# Patient Record
Sex: Female | Born: 1975 | Race: White | Hispanic: Yes | Marital: Married | Smoking: Former smoker
Health system: Southern US, Community
[De-identification: ages and names within clinical notes are randomized; demographics above are authoritative.]

## PROBLEM LIST (undated history)

## (undated) ENCOUNTER — Inpatient Hospital Stay (HOSPITAL_COMMUNITY): Payer: Self-pay

## (undated) DIAGNOSIS — O021 Missed abortion: Secondary | ICD-10-CM

## (undated) DIAGNOSIS — R079 Chest pain, unspecified: Secondary | ICD-10-CM

## (undated) DIAGNOSIS — E119 Type 2 diabetes mellitus without complications: Secondary | ICD-10-CM

## (undated) HISTORY — PX: OTHER SURGICAL HISTORY: SHX169

## (undated) HISTORY — DX: Type 2 diabetes mellitus without complications: E11.9

## (undated) HISTORY — DX: Chest pain, unspecified: R07.9

## (undated) HISTORY — DX: Missed abortion: O02.1

---

## 2008-12-24 ENCOUNTER — Inpatient Hospital Stay (HOSPITAL_COMMUNITY): Admission: AD | Admit: 2008-12-24 | Discharge: 2008-12-24 | Payer: Self-pay | Admitting: Family Medicine

## 2009-07-08 ENCOUNTER — Ambulatory Visit: Payer: Self-pay | Admitting: Gynecology

## 2009-07-13 ENCOUNTER — Ambulatory Visit: Payer: Self-pay | Admitting: Gynecology

## 2009-07-14 ENCOUNTER — Ambulatory Visit: Payer: Self-pay | Admitting: Gynecology

## 2009-08-06 ENCOUNTER — Ambulatory Visit: Payer: Self-pay | Admitting: Gynecology

## 2010-02-06 ENCOUNTER — Ambulatory Visit: Payer: Self-pay | Admitting: Obstetrics and Gynecology

## 2010-02-08 ENCOUNTER — Ambulatory Visit: Payer: Self-pay | Admitting: Gynecology

## 2010-03-22 ENCOUNTER — Ambulatory Visit: Payer: Self-pay | Admitting: Gynecology

## 2010-03-28 ENCOUNTER — Ambulatory Visit: Payer: Self-pay | Admitting: Gynecology

## 2010-04-19 ENCOUNTER — Ambulatory Visit: Payer: Self-pay | Admitting: Gynecology

## 2010-09-28 ENCOUNTER — Encounter: Admission: RE | Admit: 2010-09-28 | Discharge: 2010-09-28 | Payer: Self-pay | Admitting: Specialist

## 2011-03-13 LAB — URINALYSIS, ROUTINE W REFLEX MICROSCOPIC
Bilirubin Urine: NEGATIVE
Glucose, UA: NEGATIVE mg/dL
Ketones, ur: NEGATIVE mg/dL
Specific Gravity, Urine: 1.02 (ref 1.005–1.030)
pH: 7 (ref 5.0–8.0)

## 2011-03-13 LAB — CBC
HCT: 40.7 % (ref 36.0–46.0)
MCHC: 34.3 g/dL (ref 30.0–36.0)
MCV: 88.2 fL (ref 78.0–100.0)
Platelets: 256 10*3/uL (ref 150–400)
WBC: 8.3 10*3/uL (ref 4.0–10.5)

## 2011-03-13 LAB — WET PREP, GENITAL: Yeast Wet Prep HPF POC: NONE SEEN

## 2011-03-13 LAB — URINE MICROSCOPIC-ADD ON

## 2011-03-13 LAB — POCT PREGNANCY, URINE: Preg Test, Ur: NEGATIVE

## 2011-03-13 LAB — URINE CULTURE

## 2011-03-13 LAB — GC/CHLAMYDIA PROBE AMP, GENITAL: GC Probe Amp, Genital: NEGATIVE

## 2012-06-03 ENCOUNTER — Other Ambulatory Visit: Payer: Self-pay | Admitting: *Deleted

## 2012-06-03 ENCOUNTER — Ambulatory Visit (INDEPENDENT_AMBULATORY_CARE_PROVIDER_SITE_OTHER): Payer: Self-pay | Admitting: Gynecology

## 2012-06-03 ENCOUNTER — Encounter: Payer: Self-pay | Admitting: Gynecology

## 2012-06-03 VITALS — BP 122/80

## 2012-06-03 DIAGNOSIS — N979 Female infertility, unspecified: Secondary | ICD-10-CM

## 2012-06-03 DIAGNOSIS — N915 Oligomenorrhea, unspecified: Secondary | ICD-10-CM

## 2012-06-03 DIAGNOSIS — E119 Type 2 diabetes mellitus without complications: Secondary | ICD-10-CM | POA: Insufficient documentation

## 2012-06-03 DIAGNOSIS — E663 Overweight: Secondary | ICD-10-CM

## 2012-06-03 MED ORDER — DOXYCYCLINE HYCLATE 50 MG PO CAPS: 100.0000 mg | ORAL_CAPSULE | Freq: Two times a day (BID) | ORAL | Status: AC

## 2012-06-03 NOTE — Patient Instructions (Addendum)
Infertilidad (Infertility) QU ES LA INFERTILIDAD?  La infertilidad normalmente se define como la incapacidad para quedar embarazada luego de un ao de relaciones sexuales regulares sin la utilizacin de mtodos anticonceptivos. O la incapacidad de llevar a trmino Chartered loss adjuster y Warehouse manager el beb. La tasa de infertilidad en los [place] es de alrededor del 10%. El [address] es el resultado de una cadena de sucesos. La mujer debe liberar el vulo de uno de sus ovarios (ovulacin). El vulo debe fertilizarse con el esperma. Luego viaja a travs de las trompas de Falopio hacia el tero (matriz), donde se une a la pared del tero y crece. El hombre debe tener suficiente esperma y el esperma debe unirse con el vulo (fertilizar) en el momento justo. El vulo fertilizado debe luego unirse al interior del tero. Esto parece simple, pero pueden ocurrir [employer] cosas que evitan que el embarazo se produzca.  DE QUIN ES EL PROBLEMA?  Un 20% de los casos de infertilidad se deben a problemas del hombres (factores masculinos) y un 65% se debe a problemas de la mujer (factores femeninos). Otras causas pueden ser Neomia Dear combinacin de factores masculinos y femeninos o a causas desconocidas.  CULES SON LAS CAUSAS DE LA INFERTILIDAD EN EL HOMBRE?  La infertilidad en el hombre a menudo se debe a problemas para producir el esperma o hacer que el mismo llegue al vulo. Los problemas de esperma pueden existir desde el nacimiento o desarrollarse ms tarde debido a enfermedades o lesiones. Algunos hombres no producen esperma, o producen muy poco (oligospermia). Entre otros problemas se incluyen:  Disfuncin sexual   Problemas hormonales o endocrinos.   La edad. La fertilidad del hombre disminuye con la edad, pero no tan temprano como la femenina.   Infecciones.   Problemas congnitos Defectos de nacimiento, como ausencia de los tubos que transportan el esperma (conductos deferentes).   Problemas genticos o de  cromosomas.   Problemas de anticuerpos antiesperma.   Eyaculacin retrgrada (el esperma va hacia la vejiga).   Varicoceles, espermatoceles o tumores en los testculos.   El estilo de vida puede influir en el nmero y la calidad del esperma del hombre.   El alcohol y las drogas pueden reducir temporalmente la calidad del esperma.   Las toxinas [address], como los pesticidas y el plomo, pueden ocasionar algunos casos de infertilidad en hombres.  CULES SON LAS CAUSAS DE LA INFERTILIDAD EN LA MUJER?   La infertilidad en las mujeres est causada mayormente por problemas con la ovulacin. Sin ovulacin, el vulo no puede fertilizarse.   Seales de problemas de ovulacin son perodos menstruales irregulares o ningn perodo.   Factores simples del estilo de vida, como el estrs, la dieta, o el entrenamiento deportivo, pueden afectar el balance hormonal de Medical laboratory scientific officer.   La edad. La fertilidad comienza a IT consultant alrededor de los 30 aos y [place] a Glass blower/designer de los 37.   Mucho menos a menudo, un desequilibrio hormonal por un problema mdico serio como un tumor en la glndula pituitaria, tiroides u otra enfermedad mdica crnica pueden ocasionar problemas de ovulacin.   Infecciones plvicas.   Sndrome de ovarios poliqusticos (aumento de hormonas masculinas, incapacidad de ovular).   Consumo de alcohol o drogas.   Toxinas ambientales, radiacin, pesticidas y ciertos qumicos.   La edad es un factor importante en la infertilidad femenina.   La capacidad de los ovarios de una mujer para producir vulos disminuye con la edad, especialmente despus de los 35 aos.  Alrededor de un tercio de las parejas en las que la mujer tiene ms de 35 aos tendr problemas de infertilidad.   En el momento en el que alcanza la [place], cuando su perodo menstrual se detiene, la mujer ya no puede producir vulos ni quedar embarazada.   Otros problemas tambin pueden llevar a la  infertilidad en las mujeres. Si las trompas de Nordstrom estn bloqueadas en [employer] extremos, el vulo no puede pasar a travs de los tubos [place]. Tejido cicatrizal (adhesiones) en la pelvis que pueden obstruir las trompas. Esto puede dar como resultado una enfermedad inflamatoria plvica, endometriosis o una ciruga por embarazo ectpico (en la que el vulo fertilizado se ha implantado fuera del tero) o cualquier ciruga plvica o abdominal que ocasione adherencias.   Tumores fibroides o plipos en el tero.   Anormalidades congnitas (de nacimiento) del tero.   Infecciones en el cuello del tero (cervicitis).   Estenosis cervical (estrechamiento).   Mucosidad cervical anormal.   Sndrome poliqustico de los ovarios.   Tener relaciones sexuales muy seguidas (todos [place] o 4 a 5 veces por semana).   Obesidad.   Anorexia.   Dficit nutricional.   Mucho ejercicio, con prdida de grasa corporal.   DES. Su madre ha recibido la hormona dietilstilbesterol cuando estaba embarazada de usted.  CMO SE ANALIZA LA INFERTILIDAD?  Si ha estado tratando de quedar embarazada sin xito, podra querer buscar ayuda mdica. No debera esperar un ao de intentos sin xito antes de buscar ayuda profesional si:  Tiene mas de 35 aos de edad   Tiene razones para creer que podra tener problemas de fertilidad.  Un examen mdico determinar las causas de infertilidad de la pareja, Normalmente el proceso comienza con:  Exmenes fsicos   Historias clnicas de ambos miembros de la pareja.   Historiales sexuales de ambos miembros de la pareja.  Si no hay un problema evidente, como relaciones sexuales en tiempos inadecuados o ausencia de ovulacin, se necesitarn Academic librarian.   Para el hombre, los anlisis normalmente comienzan con pruebas de semen para observar:   La cantidad de esperma.   La forma del esperma.   El movimiento del esperma.   Realizar un historial clnico  y quirrgico completo.   Examen fsico.   Control de infecciones en los rganos reproductivos.  Podrn realizarle anlisis hormonales.   Para la mujer, el primer paso del anlisis es saber si ovula cada mes. Hay diferentes modos de Designer, industrial/product. Por ejemplo, puede llevar un registro de los cambios en la temperatura corporal por la maana y la textura de la mucosidad cervical. Otra herramienta es un kit de prueba de ovulacin casero, que puede comprarse en la farmacia.   Tambin pueden realizarse controles de ovulacin en el consultorio mdico, mediante anlisis de sangre para observar los niveles de hormonas o pruebas de [employer] ovarios. Si la mujer est ovulando, se necesitarn realizar ms exmenes. Algunas femeninas comunes incluyen:   Histerosalpingografa: Se realizan rayos x de las trompas de [place]opio y el tero con una inyeccin de [place]. Mostrar si las trompas estn abiertas y la forma del tero.   Laparoscopa: Un anlisis de las trompas y otros rganos femeninos para Copywriter, advertising. Se utiliza un tubo con iluminacin llamado laparoscopio para observar el interior del abdomen.   Biopsia endometrial: Se toma una muestra del tejido del tero Film/video editor del perodo menstrual, para ver si el tejido le indica si est ovulando.  Prueba de ultrasonido transvaginal: Examina los rganos femeninos.   Histeroscopa: Utiliza un tubo con iluminacin para examinar el cuello del tero y el tero y ver si hay anormalidades dentro del mismo.  TRATAMIENTO Dependiendo de los [employer], se sugerirn IT sales professional. El tratamiento depende de la causa. Entre el 85 y el 90% de los casos de infertilidad se tratan con drogas o [place].   Hay varias drogas para la fertilidad que pueden utilizarse para las mujeres con problemas de ovulacin. Es importante hablar con el profesional que la asiste sobre la droga a Chemical engineer. Deber comprender los beneficios y  efectos colaterales de las drogas. Segn el tipo de droga para la fertilidad y la dosis [place], algunas mujeres podran tener embarazos mltiples (mellizos).   De ser [employer], se puede realizar una ciruga para reparar daos en los ovarios de la [place], trompas de [place], el cuello del tero o el tero.   Tratamiento quirrgico o mdico para la endometriosis o el sndrome de ovario poliqustico. A veces, los problemas de fertilidad en el hombre pueden corregirse con medicamentos o [place].   Inseminacin intrauterina, IUI, de esperma en concordancia con la ovulacin.   Cambios en el estilo de vida, si esta es la causa (prdida de [place], aumento de ejercicios, dejar de fumar, beber excesivamente o tomar drogas ilegales).   Otros tipos de ciruga:   Extirpar tumores por dentro o sobre el tero.   Eliminar tejido cicatrizal de dentro del tero.   Arreglar trompas obstruidas.   Eliminar tejido cicatrizal en la pelvis y alrededor de los rganos femeninos.  QU ES LA TECNOLOGA DE REPRODUCCIN ASISTIDA (ART)?  La tecnologa de reproduccin asistida (ART) utiliza mtodos especiales para ayudar a parejas infrtiles. En esta tcnica se manipula tanto el vulo de la mujer como el esperma del hombre. El xito depende de muchos factores. La ART puede ser cara y [address]. Pero ha hecho posible que muchas parejas tengan hijos que de otra manera no hubieran podido. A continuacin se enumeran algunos mtodos:  Fertilizacin. In Vitro (FIV). In Vitro (IVF) es un procedimiento que se hizo famoso con el nacimiento en 1978 de [place], el primer "beb de probeta" del mundo. Se utiliza cuando las trompas de Nordstrom de la mujer estn bloqueadas o cuando el hombre tiene poco esperma. Se utiliza una droga para estimular los ovarios y producir mltiples vulos. Una vez maduros, los vulos se retiran y se Industrial/product designer en una placa de cultivo con el esperma del hombre para la fertilizacin. Luego de  aproximadamente 40 horas, los vulos se examinan para ver si han sido fertilizados por el esperma y se han dividido en clulas. Esos vulos fertilizados (embriones) se colocan luego en el tero de la mujer. Esto evita el paso por las trompas de [place].   La transferencia intrafalopiana de gametas (GIFT) es similar al IVF, pero se utiliza cuando la mujer tiene al menos una trompa de falopio normal. Se colocan de tres a cinco vulos en la trompa de Falopio, junto con el esperma del hombre, para que la fertilizacin se realice dentro del cuerpo de Architectural technologist.   La transferencia intrafalopiana de cigotos (ZIFT), tambin se denomina transferencia embrionaria, y [place] el IVF con el GIFT. Los vulos retirados de los ovarios de la mujer se Land laboratorio y se Industrial/product designer en las trompas de Nordstrom en lugar de en el tero.   Los procedimientos de ART a menudo suponen la utilizacin de donantes de  vulos (de otra mujer) o de embriones congelados previamente. Los donantes de vulos pueden utilizarse si la mujer tiene [employer] ovarios o posee una enfermedad gentica que podra transmitirla al beb.   Cuando se realiza una ART hay mayor riesgo de embarazos mltiples, mellizos, trillizos o ms.   La inyeccin de esperma intracitoplasmtica es un procedimiento que inyecta un espermatozoide en el vulo para fertilizarlo.   El trasplante embrionario es un procedimiento que comienza con un embrin que se ha desarrollado en un medio especial (solucin qumica) preparado para mantener el embrin vivo por 2 a 5 das, y Conservation officer, historic buildings.  En los [employer] que no puede encontrarse la causa y Firefighter no se produce, podr considerarse la adopcin. Document Released: 12/03/2007 Document Revised: 11/02/2011  Ejercicios para perder peso (Exercise to Lose Weight) La actividad fsica y Neomia Dear dieta saludable ayudan a perder peso. El mdico podr sugerirle ejercicios especficos. IDEAS Y  CONSEJOS PARA HACER EJERCICIOS  Elija opciones econmicas que disfrute hacer , como caminar, andar en bicicleta o los vdeos para ejercitarse.   Utilice las Microbiologist del ascensor.   Camine durante la hora del almuerzo.   Estacione el auto lejos del lugar de [place] o [place].   Concurra a un gimnasio o tome clases de gimnasia.   Comience con 5  10 minutos de actividad fsica por da. Ejercite hasta 30 minutos, 4 a 6 das por [address]   Utilice zapatos que tengan un buen soporte y ropas cmodas.   Elongue antes y despus de Company secretary.   Ejercite hasta que aumente la respiracin y el corazn palpite rpido.   Beba agua extra cuando ejercite.   No haga ejercicio Firefighter, sentirse mareado o que le falte mucho el aire.  La actividad fsica puede quemar alrededor de 150 caloras.  Correr 20 cuadras en 15 minutos.   Jugar vley durante 45 a 60 minutos.   Limpiar y encerar el auto durante 45 a 60 minutos.   Jugar ftbol americano de toque.   Caminar 25 cuadras en 35 minutos.   Empujar un cochecito 20 cuadras en 30 minutos.   Jugar baloncesto durante 30 minutos.   Rastrillar hojas secas durante 30 minutos.   Andar en bicicleta 80 cuadras en 30 minutos.   Caminar 30 cuadras en 30 minutos.   Bailar durante 30 minutos.   Quitar la nieve con una pala durante 15 minutos.   Nadar vigorosamente durante 20 minutos.   Subir escaleras durante 15 minutos.   Andar en bicicleta 60 cuadras durante 15 minutos.   Arreglar el jardn entre 30 y 45 minutos.   Saltar a la soga durante 15 minutos.   Limpiar vidrios o pisos durante 45 a 60 minutos.  Document Released: 02/17/2011 Document Revised: 07/26/2011 [facility] Patient Information 2012 [place], [place].                                                   Control del colesterol  Los niveles de colesterol en el organismo estn determinados significativamente por su dieta. Los niveles de colesterol tambin se  relacionan con la enfermedad cardaca. El material que sigue ayuda a Software engineer relacin y a Chiropractor qu puede hacer para mantener su corazn sano. No todo el colesterol es [place]. Las lipoprotenas de baja densidad (LDL) forman el  colesterol "malo". El colesterol malo puede ocasionar depsitos de grasa que se acumulan en el interior de las arterias. Las lipoprotenas de alta densidad (HDL) es el colesterol "bueno". Ayuda a remover el colesterol LDL "malo" de la [place]. El colesterol es un factor de riesgo muy importante para la enfermedad cardaca. Otros factores de riesgo son la hipertensin arterial, el hbito de fumar, el estrs, la herencia y [place].  El msculo cardaco obtiene el suministro de sangre a travs de las arterias coronarias. Si su colesterol LDL ("malo") est elevado y el HDL ("bueno") es bajo, tiene un factor de riesgo para que se formen depsitos de [place] representative en las arterias coronarias (los vasos sanguneos que suministran sangre al corazn). Esto hace que haya menos lugar para que la sangre circule. Sin la suficiente sangre y oxgeno, el msculo cardaco no puede funcionar correctamente, y usted podr sentir dolores en el pecho (angina pectoris). Cuando una arteria coronaria se cierra completamente, una parte del msculo cardaco puede morir (infarto de miocardio). CONTROL DEL COLESTEROL Cuando el profesional que lo asiste enva la sangre al laboratorio para Artist nivel de colesterol, puede realizarle tambin un perfil completo de los lpidos. Con esta prueba, se puede determinar la cantidad total de colesterol, as como los niveles de LDL y HDL. Los triglicridos son un tipo de grasa que circula en la sangre y que tambin puede utilizarse para determinar el riesgo de enfermedad cardaca. En la siguiente tabla se establecen los nmeros ideales: Prueba: Colesterol total  Menos de 200 mg/dl.  Prueba: LDL "colesterol malo"  Menos de 100 mg/dl.   Menos de 70 mg/dl si tiene riesgo muy  elevado de sufrir un ataque cardaco o muerte cardaca sbita.  Prueba: HDL "colesterol bueno"  Mujeres: Ms de 50 mg/dl.   Hombres: Ms de 40 mg/dl.  Prueba: Trigliceridos  Menos de 150 mg/dl.  CONTROL DEL COLESTEROL CON DIETA Aunque factores como el ejercicio y el estilo de vida son importantes, la "primera lnea de ataque" es la dieta. Esto se debe a que se sabe que ciertos alimentos hacen subir el colesterol y otros lo [place]. El objetivo debe ser [employer] alimentos, de modo que tengan un efecto sobre el colesterol y, an ms importante, Microbiologist las grasas saturadas y trans con otros tipos de grasas, como las monoinsaturadas y las poliinsaturadas y cidos grasos omega-3 . En promedio, una persona no debe consumir ms de 15 a 17 g de grasas saturadas por [employer]. Las grasas saturadas y trans se consideran grasas "malas", ya que elevan el colesterol LDL. Las grasas saturadas se encuentran principalmente en productos animales como carne, [place] y crema. Pero esto no significa que usted Marketing executive todas sus comidas favoritas. Actualmente, como lo muestra el cuadro que figura al final de este documento, hay sustitutos de buen sabor, bajos en grasas y en colesterol, para la mayora de los alimentos que a usted Musician. Elija aquellos alimentos alternativos que sean bajos en grasas o sin grasas. Elija cortes de carne del cuarto trasero o lomo ya que estos cortes son los que tienen menor cantidad de grasa y Oncologist. El pollo (sin piel), el pescado, la carne de ternera, y la [place] de [place] molida son excelentes opciones. Elimine las carnes [employer] o el salami. Los [employer] o nada de grasas saturadas. Cuando consuma carne [place], carne de aves de corral, o pescado, hgalo en porciones de 85 gramos (3 onzas). Las grasas trans tambin se llaman "aceites  parcialmente hidrogenados". Son aceites manipulados cientficamente de [place] que son slidos a Educational psychologist, tienen una larga vida y Glass blower/designer sabor y la textura de los alimentos a los que se Scientist, clinical (histocompatibility and immunogenetics). Las grasas trans se encuentran en la [place], [place], crackers y alimentos horneados.  Para hornear y cocinar, el aceite es un excelente sustituto para la [employer]. Los aceites monoinsaturados tienen un beneficio particular, ya que se cree que disminuyen el colesterol LDL (colesterol malo) y elevan el HDL. Deber evitar los aceites tropicales saturados como el de coco y el de [place].  Recuerde, adems, que puede comer sin restricciones los grupos de alimentos que son naturalmente libres de grasas saturadas y Neurosurgeon trans, entre los que se incluyen el pescado, las frutas (excepto el [place]), verduras, frijoles, cereales (cebada, arroz, [place], trigo) y las pastas (sin salsas con crema)  IDENTIFIQUE LOS ALIMENTOS QUE DISMINUYEN EL COLESTEROL  Pueden disminuir el colesterol las fibras solubles que estn en las frutas, como las [place], en los vegetales como el brcoli, las patatas y las zanahorias; en las legumbres como frijoles, guisantes y Therapist, occupational; y en los cereales como la cebada. Los alimentos fortificados con fitosteroles tambin Engineer, production. Debe consumir al menos 2 g de estos alimentos a diario para Financial planner de disminucin de [place].  En el supermercado, lea las etiquetas de los envases para identificar los alimentos bajos en grasas saturadas, libres de grasas trans y bajos en [place], . Elija quesos que tengan solo de 2 a 3 g de grasa saturada por onza (28,35 g). Use una margarina que no dae el corazn, [place] de grasas trans o aceite parcialmente hidrogenado. Al comprar alimentos horneados (galletitas dulces y Gaffer) evite el aceite parcialmente hidrogenado. Los panes y bollos debern ser de granos enteros (harina de maz o de avena entera, en lugar de "harina" o "harina enriquecida"). Compre sopas en lata que no sean cremosas, con bajo contenido de sal y sin  grasas adicionadas.  TCNICAS DE PREPARACIN DE LOS ALIMENTOS  Nunca fra los alimentos en aceite abundante. Si debe frer, hgalo en poco aceite y removiendo [place], porque as se utilizan muy pocas grasas, o utilice un spray antiadherente. Cuando le sea posible, hierva, hornee o ase las carnes y cocine los vegetales al vapor. En vez de A[employer]na con mantequilla o [place], utilice limn y hierbas, pur de Psychologist, educational y canela (para las calabazas y batatas), yogurt y salsa descremados y aderezos para ensaladas bajos en contenido graso.  BAJO EN GRASAS SATURADAS / SUSTITUTOS BAJOS EN GRASA  [place]arnes / Grasas saturadas (g)  Evite: Bife, corte graso (3 oz/85 g) / 11 g   Elija: Bife, corte magro (3 oz/85 g) / 4 g   Evite: Hamburguesa (3 oz/85 g) / 7 g   Elija:  [place] magra (3 oz/85 g) / 5 g   Evite: Jamn (3 oz/85 g) / 6 g   Elija:  Jamn magro (3 oz/85 g) / 2.4 g   Evite: Pollo, con piel (3 oz/85 g), Carne oscura / 4 g   Elija:  Pollo, sin piel (3 oz/85 g), Carne oscura / 2 g   Evite: Pollo, con piel (3 oz/85 g), Carne magra / 2.5 g   Elija: Pollo, sin piel (3 oz/85 g), Carne magra / 1 g  Lcteos / Grasas saturadas (g)  Evite: Leche entera (1 taza) / 5 g   Elija: Leche con bajo contenido de grasa, 2% (1 taza) / 3 g   Elija:  Leche con bajo contenido de grasa, 1% (1 taza) / 1.5 g   Elija: Leche descremada (1 taza) / 0.3 g   Evite: Queso duro (1 oz/28 g) / 6 g   Elija: Queso descremado (1 oz/28 g) / 2-3 g   Evite: Queso cottage, 4% grasa (1 taza)/ 6.5 g   Elija: Queso cottage con bajo contenido de grasa, 1% grasa (1 taza)/ 1.5 g   Evite: Helado (1 taza) / 9 g   Elija: Sorbete (1 taza) / 2.5 g   Elija: Yogurt helado sin contenido de grasa (1 taza) / 0.3 g   Elija: Barras de fruta congeladas / vestigios   Evite: Crema batida (1 cucharada) / 3.5 g   Elija: Batidos glac sin lcteos (1 cucharada) / 1 g  Condimentos / Grasas saturadas (g)  Evite:  Mayonesa (1 cucharada) / 2 g   Elija: Mayonesa con bajo contenido de grasa (1 cucharada) / 1 g   Evite: [place] (1 cucharada) / 7 g   Elija: Margarina extra light (1 cucharada) / 1 g   Evite: Aceite de coco (1 cucharada) / 11.8 g   Elija: Aceite de oliva (1 cucharada) / 1.8 g   Elija: Aceite de maz (1 cucharada) / 1.7 g   Elija: Aceite de crtamo (1 cucharada) / 1.2 g   Elija: Aceite de girasol (1 cucharada) / 1.4 g   Elija: Aceite de soja (1 cucharada) / 2.4 g   Elija: Aceite de canola (1 cucharada) / 1 g  Document Released: 11/13/2005 Document Revised: 07/26/2011 [facility] Patient Information 2012 [place], [place].   ExitCare Patient Information 2012 [place], [place].

## 2012-06-03 NOTE — Progress Notes (Signed)
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 Patient had been placed on clomiphene citrate for 2 cycles and stated she had done a home ovulation predictor test which were positive although 2 serum progesterone levels that were done in the office were below normal range although it appears it was a timing issue in patient not following instructions correctly. At that time patient had a normal TSH and prolactin. She now has  a new partner and has not been able to conceive. Her primary physician recently diagnosed her with type 2 diabetes and she's currently on metformin 500 mg twice a day as a result of her hemoglobin A1c having been found to be elevated at 6.9. She continues to suffer from oligomenorrhea whereby she will go 3 or 4 months without a menstrual cycle. She is overweight weight 156 pounds. She denies any visual disturbances or any unusual headaches or galactorrhea. She's currently on the third day of her menstrual cycle.  We will through a lengthy discussion once again of that causes and treatment of infertility. She brought a semen analysis report from her current partner that was done in 2010 whereby no agglutination was present normal viscosity was present normal volume and sperm count was 183 million. Normal forms 15% abnormal heads 68%.  In 2011 another provider had ordered an ultrasound and the reported demonstrated small cystic spaces in heterogeneity of the endometrium questionable polyps or hyperplasia and was to return back for followup endometrial biopsy which she did in February 2012 and the pathology report done by another provider had demonstrated the specimen consisted of minute strips of endometrial epithelium that appeared in active in may be from the lower uterine segment. No endometrium with intact glands and stroma were  present. Patient had a normal Pap smear in June of this year.  We will go ahead and proceeded scheduling a hysterosalpingogram at the end of the week to determine tubal patency. She will be prescribed Vibramycin to take 1 by mouth twice a day for 3 days starting with the day prior to the procedure for prophylaxis. We'll also check her prolactin level which she's not have a recent one done. She'll return back next week to discuss the results and plan a course of management and possibly repeat her partner semen analysis. All the above were discussed with the patient Spanish and literature information was provided as well. All questions were answered and we'll follow accordingly.

## 2012-06-04 ENCOUNTER — Telehealth: Payer: Self-pay | Admitting: *Deleted

## 2012-06-04 ENCOUNTER — Other Ambulatory Visit: Payer: Self-pay

## 2012-06-04 LAB — PROLACTIN: Prolactin: 7.2 ng/mL

## 2012-06-04 NOTE — Telephone Encounter (Signed)
Patient informed by [facility] CMA that appt set up for [facility]G for 06/07/12 @ 1pm at [facility].  Will need to pay $625 at check in.  Patient agreed.

## 2012-06-04 NOTE — Telephone Encounter (Signed)
Message copied by Libby Maw on Tue Jun 04, 2012  9:51 AM ------      Message from: Ok Edwards      Created: Mon Jun 03, 2012  3:34 PM       Please schedule HSG for this patient this week. Her last menstrual period 07/06. I need to see her next week.

## 2012-06-04 NOTE — Telephone Encounter (Signed)
CLARIFIED TO MELANIE AT [facility]. TO Take VIBRAMYCIN 2 Capsules OF 100 MG BID X 3 DAYS.

## 2012-06-07 ENCOUNTER — Ambulatory Visit (HOSPITAL_COMMUNITY)
Admission: RE | Admit: 2012-06-07 | Discharge: 2012-06-07 | Disposition: A | Discharge status: Home / Self Care | Payer: Self-pay | Source: Ambulatory Visit | Attending: Gynecology | Admitting: Gynecology

## 2012-06-07 DIAGNOSIS — N979 Female infertility, unspecified: Secondary | ICD-10-CM | POA: Insufficient documentation

## 2012-06-07 MED ORDER — IOHEXOL 300 MG/ML  SOLN
30.0000 mL | Freq: Once | INTRAMUSCULAR | Status: AC | PRN
  Administered 2012-06-07: 30 mL

## 2012-06-25 ENCOUNTER — Encounter: Payer: Self-pay | Admitting: Gynecology

## 2012-06-25 ENCOUNTER — Institutional Professional Consult (permissible substitution): Payer: Self-pay | Admitting: Gynecology

## 2012-06-25 ENCOUNTER — Ambulatory Visit (INDEPENDENT_AMBULATORY_CARE_PROVIDER_SITE_OTHER): Payer: Self-pay | Admitting: Gynecology

## 2012-06-25 VITALS — BP 120/82

## 2012-06-25 DIAGNOSIS — N915 Oligomenorrhea, unspecified: Secondary | ICD-10-CM

## 2012-06-25 DIAGNOSIS — N979 Female infertility, unspecified: Secondary | ICD-10-CM | POA: Insufficient documentation

## 2012-06-25 MED ORDER — CLOMIPHENE CITRATE 50 MG PO TABS: ORAL_TABLET | ORAL | Status: DC

## 2012-06-25 MED ORDER — MEDROXYPROGESTERONE ACETATE 10 MG PO TABS: ORAL_TABLET | ORAL | Status: DC

## 2012-06-25 NOTE — Patient Instructions (Addendum)
Ecografa transvaginal (Transvaginal Ultrasound) La ecografa transvaginal es una ecografa plvica en la que se utiliza una probeta metlica que se coloca en la vagina, para observar los rganos femeninos. El ecgrafo enva ondas sonoras desde un transductor (sonda). Estas ondas sonoras chocan contra las estructuras del cuerpo (como un eco) y crean Naval architect. La imagen se observa en un monitor. Se denomina transvaginal debido a que la sonda se inserta dentro de la vagina. Puede haber una pequea molestia por la introduccin de la sonda. Esta prueba tambin puede realizarse [employer]. La ecografa endovaginal es otro nombre para la ecografa transvaginal. En una ecografa transabdominal, la sonda se coloca en la parte externa del abdomen. Este mtodo no ofrece imgenes tan buenas como la tcnica transvaginal. La ecogafa transvaginal se utiliza para observar alteraciones en el tracto genital femenino. Entre ellos se incluyen:  Problemas de infertilidad.   Malformaciones congnitas (defecto de nacimiento) del tero y los ovarios.   Tumores en el tero.   Hemorragias anormales.   Tumores y quistes de ovario.   Abscesos (tejidos inflamados y pus) en la pelvis.   Dolor abdominal o plvico sin causa aparente.   Infecciones plvicas.  DURANTE EL EMBARAZO, SE UTILIZA PAR OBSERVAR:  Embarazos normales.   Un embarazo ectpico (embarazo fuera del tero).   Latidos cardacos fetales.   Anormalidades de la pelvis que no se observan bien con la ecografa transabdominal.   Sospecha de gemelos o embarazo mltiple.   Aborto inminente.   Problemas en el cuello del tero (cuello incompetente, no permanece cerrado para contener al beb).   Cuando se realiza una amniocentesis (se retira lquido de la bolsa [place], para ser [place]).   Al buscar anormalidades en el beb.   Para controlar el crecimiento, el desarrollo y la edad del feto.   Para medir la cantidad de lquido en el  saco amnitico.   Cuando se realiza una versin externa del beb (se lo mueve a Loss adjuster, chartered).   Evaluar al beb en embarazos de alto riesgo (perfil biofsico).   Si se sospecha el deceso del beb (muerte).  En algunos casos, se utiliza un mtodo especial denominado ecografa con infusin salina, para una observacin ms precisa del tero. Se inyecta solucin salina (agua con sal) dentro del tero en pacientes no embarazadas para observar mejor su interior. Este mtodo no se Glass blower/designer. La probeta tambin puede usarse para obtener biopsias de Insurance claims handler, para drenar lquido de quistes de ovario y para Editor, commissioning un DIU (dispositivo intrauterino para el control de la natalidad) que no pueda [place]. PREPARACIN PARA LA PRUEBA La ecografa transvaginal se realiza con la vejiga vaca. La ecografa transabdominal se realiza con la vejiga llena. Podrn solicitarle que beba varios vasos de agua antes del examen. En algunos casos se realiza una ecografa transabdominal antes de la ecografa transvaginal para obervar los rganos del abdomen. PROCEDIMIENTO  Deber acostarse en una cama, con las rodillas dobladas y los pies en los estribos. La probeta se cubre con un condn. Dentro de la vagina y en la probeta se aplica un lubricante estril. El lubricante ayuda a transmitir las ondas sonoras y Nature conservation officer la irritacin de la vagina. El mdico mover la sonda en el interior de la cavidad vaginal para escanear las estructuras plvicas. Un examen normal mostrar una pelvis normal y contenidos normales en su interior. Una prueba anormal mostrar anormalidades en la pelvis, la placenta o el beb. LAS CAUSAS DE UN RESULTADO ANORMAL PUEDEN SER:  Crecimientos o tumores en:   El tero.   Los ovarios.   La vagina.   Otras estructuras plvicas.   Crecimientos no cancerosos en el tero y los ovarios.   El ovario se retuerce y se corta el suministro de [place] (torsin [place]).   Las  reas de infeccin incluyen:   Enfermedad inflamatoria plvica.   Un absceso en la pelvis.   Ubicacin de un DIU.  LOS PROBLEMAS QUE PUEDEN HALLARSE EN UNA MUJER EMBARAZADA SON:  Embarazo ectpico (embarazo fuera del tero).   Embarazos mltiples.   Dilatacin (apertura) precoz anormal del cuello del tero. Esto puede indicar un cuello incompetente y Surveyor, mining.   Aborto inminente.   Muerte fetal.   Los problemas con la placenta incluyen:   La placenta se ha desarrollado sobre la abertura del cuello del tero (placenta previa).   La placenta se ha separado anticipadamente en el tero (abrupcin placentaria).   La placenta se desarrolla en el msculo del tero (placenta acreta).   Tumores del Psychiatrist, incluyendo la enfermedad trofoblstica gestacional. Se trata de un embarazo anormal en el que no hay feto. El tero se llena de quistes similares a uvas que en algunos casos son cancerosos.   Posicin incorrecta del feto (de nalgas, de vrtice).   Retraso del desarrollo fetal intrauterino (escaso desarrollo en el tero).   Anormalidades o infeccin fetal.  RIESGOS Y COMPLICACIONES No hay riesgos conocidos para la ecografa. No se toman radiografas cuando se realiza una ecografa. Document Released: 03/01/2009 Document Revised: 11/02/2011 [facility] Patient Information 2012 [place], [place].   1. Si no te vaja el periodo el 13 de Agosto espontaneo se have prueba de embarazo si negativo pues se toma el provera una tableta diaria de 5-10 para que le vaje el periodo. 2. El primer dia que sangras es el dia numbero uno de ese siclo. Cuentas  del dia 5-9 se toma dos  tabletas de clomiphene. 3. Del dia 12-16 se chequea la [employer] veces al dia con Armed forces operational officer de ovulaccion y cuando notes cambio tenga relaccion con pareja esa noche y [employer] noches. El dia 20 o 21 o 22 pasar por oficina para sacar [place] de sangre (nivel de progesterona) para saber si hai que aumentar la  dosis para el proximio siclo si no a caido embarazada 4. Sequir tomando el metformin [employer] al dia y la vitamina prenatal Neomia Dear vez al dia 5. LLamar a la [employer] dias despues de sacar sangre para los [place]. Deje un mensaje para el Dr. Lily Peer

## 2012-06-25 NOTE — Progress Notes (Signed)
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 Patient had been placed on clomiphene citrate for 2 cycles several years ago and stated she had done a home ovulation predictor test which were positive although 2 serum progesterone levels that were done in the office were below normal range although it appears it was a timing issue in patient not following instructions correctly. At that time patient had a normal TSH and prolactin. She now has a new partner and has not been able to conceive. Her primary physician recently diagnosed her with type 2 diabetes and she's currently on metformin 500 mg twice a day as a result of her hemoglobin A1c having been found to be elevated at 6.9. She continues to suffer from oligomenorrhea whereby she will go 3 or 4 months without a menstrual cycle. She is overweight weight 156 pounds. She denies any visual disturbances or any unusual headaches or galactorrhea.  We will through a lengthy discussion once again of that causes and treatment of infertility. She brought a semen analysis report from her current partner that was done in 2010 whereby no agglutination was present normal viscosity was present normal volume and sperm count was 183 million. Normal forms 15% abnormal heads 68%.  In 2011 another provider had ordered an ultrasound and the reported demonstrated small cystic spaces in heterogeneity of the endometrium questionable polyps or hyperplasia and was to return back for followup endometrial biopsy which she did in February 2012 and the pathology report done by another provider had demonstrated the specimen consisted of minute strips of endometrial epithelium that appeared in active in may be from the lower uterine segment. No endometrium with intact glands and stroma were present. Patient had a normal Pap smear in June of this  year. Her recent prolactin level was 7.2.   Result of recent sonohysterogram: Findings: The endometrium is notable for multiple small rounded  foci of contrast emanating off of the posterior upper uterine  segment portion of the canal. The appearance is strongly  suspicious for adenomyosis. Several filling defects are identified  within the endometrial lumen as well most notably in the lower  uterine segment in the region of the right cornua. Given the prior  appearance of cystic change within the endometrial lining on pelvic  ultrasound these may represent cystic foci associated with  endometrial hyperplasia. Small polyps could cause a similar  appearance. Submucosal fibroids are felt less likely given lack of  visualization of fibroids on prior pelvic ultrasound.  The right fallopian tube demonstrates a normal morphology and free  intraperitoneal spill.  The left fallopian tube filled only to the mid isthmic region. The  filled portion of the tube appears normal and lack of complete  filling may be the result of preferential spill from the right  associated with some back spill into the vaginal vault. I was  unable to advance the end catheter balloon into the lower uterine  segment to occlude the internal cervical os and produce more  pressure to the column of contrast.  IMPRESSION:  Findings suspicious for adenomyosis. The several intraluminal  filling defects within the endometrial canal may represent stigmata  from the previously suspected cystic hyperplasia. Correlation with  more current pelvic ultrasound would be recommended with  sonohysterography as indicated.  Right tubal patency confirmed.  Incomplete filling of the left tube questionably  due to  preferential spill from the right combined with inability to  increase pressure of the injection due to catheter position  We had a lengthy discussion of the findings noted above. Partial occlusion of left fallopian tube? Tubal  spasm?Marland Kitchen Also the appearance of cystic changes in the endometrium or questionable polyps she will return back to the office next week for sonohysterogram and manage accordingly. We'll consider doing an endometrial biopsy as well. Patient will continue to take her metformin 500 mg twice a day along with her daily prenatal vitamins. We discussed that the sonohysterogram is normal we'll proceed then starting her on clomiphene citrate once again as she was several years ago but this time at a higher dose of 100 mg from day 5 through 9. She will then need to return to the office mid secretory time frame 4 serum progesterone level. We discussed also using the ovulation predictor kit to time or intercourse day 12 through 16 of that particular cycle. If she does not have a spontaneous  30 days from her last cycle she would do a home pregnancy test and then withdraw with Provera 10 mg one tablet daily for 5-10 days if it is negative. If after 3 cycles of ovulation induction she does not conceive we'll definitely need to proceed then with a semen analysis and consider also concurrently at next stimulated cycle to add sperm washing if indeed viscous. All the above was explained in detail and provided written instructions. Patient is fully aware that clomiphene citrate has a risk of hyperstimulation and multiple gestation. All questions were answered we'll follow accordingly.

## 2012-07-08 ENCOUNTER — Other Ambulatory Visit: Payer: Self-pay | Admitting: Gynecology

## 2012-07-08 ENCOUNTER — Ambulatory Visit (INDEPENDENT_AMBULATORY_CARE_PROVIDER_SITE_OTHER): Payer: Self-pay

## 2012-07-08 ENCOUNTER — Ambulatory Visit (INDEPENDENT_AMBULATORY_CARE_PROVIDER_SITE_OTHER): Payer: Self-pay | Admitting: Gynecology

## 2012-07-08 DIAGNOSIS — N840 Polyp of corpus uteri: Secondary | ICD-10-CM

## 2012-07-08 DIAGNOSIS — E282 Polycystic ovarian syndrome: Secondary | ICD-10-CM

## 2012-07-08 DIAGNOSIS — N915 Oligomenorrhea, unspecified: Secondary | ICD-10-CM

## 2012-07-08 DIAGNOSIS — N979 Female infertility, unspecified: Secondary | ICD-10-CM

## 2012-07-08 DIAGNOSIS — R52 Pain, unspecified: Secondary | ICD-10-CM

## 2012-07-08 DIAGNOSIS — N970 Female infertility associated with anovulation: Secondary | ICD-10-CM

## 2012-07-08 DIAGNOSIS — N949 Unspecified condition associated with female genital organs and menstrual cycle: Secondary | ICD-10-CM

## 2012-07-08 DIAGNOSIS — R102 Pelvic and perineal pain: Secondary | ICD-10-CM

## 2012-07-08 MED ORDER — KETOROLAC TROMETHAMINE 30 MG/ML IJ SOLN
30.0000 mg | Freq: Once | INTRAMUSCULAR | Status: AC
  Administered 2012-07-08: 30 mg via INTRAMUSCULAR

## 2012-07-08 MED ORDER — LIDOCAINE HCL 1 % IJ SOLN
5.0000 mL | Freq: Once | INTRAMUSCULAR | Status: AC
  Administered 2012-07-08: 5 mL

## 2012-07-08 NOTE — Progress Notes (Signed)
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 Patient had been placed on clomiphene citrate for 2 cycles several years ago and stated she had done a home ovulation predictor test which were positive although 2 serum progesterone levels that were done in the office were below normal range although it appears it was a timing issue in patient not following instructions correctly. At that time patient had a normal TSH and prolactin. She now has a new partner and has not been able to conceive. Patient's primary physician has patient on metformin twice a day for her type 2 diabetes. Patient had previously brought before a semen analysis report from her current partner that was done in 2010 whereby no agglutination was present normal viscosity was present normal volume and sperm count was 183 million. Normal forms 15% abnormal heads 68%.   In 2011 another provider had ordered an ultrasound and the reported demonstrated small cystic spaces in heterogeneity of the endometrium questionable polyps or hyperplasia and was to return back for followup endometrial biopsy which she did in February 2012 and the pathology report done by another provider had demonstrated the specimen consisted of minute strips of endometrial epithelium that appeared in active in may be from the lower uterine segment. No endometrium with intact glands and stroma were present. Patient had a normal Pap smear in June of this year. Her recent prolactin level was 7.2. Previous sonohysterogram had demonstrated the following:  Findings suspicious for adenomyosis. The several intraluminal  filling defects within the endometrial canal may represent stigmata  from the previously suspected cystic hyperplasia. Correlation with  more current pelvic ultrasound would be recommended with  sonohysterography  as indicated.  Right tubal patency confirmed.  Incomplete filling of the left tube questionably due to  preferential spill from the right combined with inability to  increase pressure of the injection due to catheter position  We had a lengthy discussion of the findings noted above. Partial occlusion of left fallopian tube? Tubal spasm?Marland Kitchen Also the appearance of cystic changes in the endometrium or questionable polyps and for this reason patient return to the office today for sonohysterogram.  Son hysterogram findings as follows: Uterus measured 8.1 x 6.0 x 3.1 cm with endometrial stripe of 10.5 mm. Prominent endometrial cavity with positive color flow was noted within the cavity. Right ovary was enlarged numerous follicles. Left ovary numerous follicles consistent with PCO S. Sonohysterogram demonstrated several entry uterine defect one measuring 21 x 6 x 17 mm and a second one measuring 23 x 6 x 25 mm suspicious for endometrial polyp.  Assessment/plan: Patient with history of oligomenorrhea and primary infertility also with intracavitary defects suspicious for endometrial polyps. Literature information was provided and is recommended the patient undergo resectoscopic polypectomy before attempting to get pregnant. All the above was discussed with the patient in Spanish and she will schedule accordingly.

## 2012-07-10 ENCOUNTER — Telehealth: Payer: Self-pay | Admitting: Anesthesiology

## 2012-07-10 MED ORDER — HYDROCODONE-ACETAMINOPHEN 5-500 MG PO TABS: 1.0000 | ORAL_TABLET | Freq: Four times a day (QID) | ORAL | Status: AC | PRN

## 2012-07-10 MED ORDER — DOXYCYCLINE HYCLATE 100 MG PO CAPS: 100.0000 mg | ORAL_CAPSULE | Freq: Two times a day (BID) | ORAL | Status: AC

## 2012-07-10 NOTE — Telephone Encounter (Signed)
PATIENT HAS BEEN INFORMED.Marland Kitchen PER DR. Lily Peer TO DO A PREGNANCY TEST AND IF NEGATIVE TO TAKE THE LORTAB.... ALL WAS EXPLAINED IN SPANISH TO PT..  LORTAB RX WAS CALLED IN .Marland Kitchen VIBRAMYCIN WAS E-SCRIBED.Marland KitchenMarland Kitchen

## 2012-07-10 NOTE — Telephone Encounter (Signed)
DR. Lily PeerBradley Ferris CALLED REQUESTING A RX SHE STILL HAVING A LOT OF PAIN FROM [facility] YESTERDAY...Marland KitchenMarland Kitchen

## 2012-07-10 NOTE — Telephone Encounter (Signed)
Please have the patient do a home pregnancy test. If negative please call in Lortab 5.5/500 to take 1 by mouth every 4-6 hours when necessary cramp #30. Also Vibramycin 100 mg twice a day for 7 days #14. No intercourse during that time.

## 2012-07-18 ENCOUNTER — Telehealth: Payer: Self-pay | Admitting: Gynecology

## 2012-07-18 NOTE — Telephone Encounter (Signed)
Chloe Perez called patient today to follow-up regarding surgery she needs to schedule.  Patient said she had talked with Dr. Glenetta Hew and informed him that she has a MD in [place] who is going to do her surgery and it will less expensive.  She does plan to return later for infertility treatment.  This information will be routed to Dr. Glenetta Hew as it is not previously documented in patient's chart.

## 2012-07-18 NOTE — Telephone Encounter (Signed)
Ok, Thanks

## 2013-09-22 ENCOUNTER — Encounter: Payer: Self-pay | Admitting: Gynecology

## 2013-09-22 ENCOUNTER — Ambulatory Visit (INDEPENDENT_AMBULATORY_CARE_PROVIDER_SITE_OTHER): Payer: Self-pay | Admitting: Gynecology

## 2013-09-22 VITALS — BP 120/70

## 2013-09-22 DIAGNOSIS — E1139 Type 2 diabetes mellitus with other diabetic ophthalmic complication: Secondary | ICD-10-CM

## 2013-09-22 DIAGNOSIS — N979 Female infertility, unspecified: Secondary | ICD-10-CM

## 2013-09-22 DIAGNOSIS — N915 Oligomenorrhea, unspecified: Secondary | ICD-10-CM

## 2013-09-22 DIAGNOSIS — E119 Type 2 diabetes mellitus without complications: Secondary | ICD-10-CM

## 2013-09-22 LAB — HEMOGLOBIN A1C: Hgb A1c MFr Bld: 7 % — ABNORMAL HIGH (ref <5.7)

## 2013-09-22 MED ORDER — MEDROXYPROGESTERONE ACETATE 10 MG PO TABS: ORAL_TABLET | ORAL | Status: DC

## 2013-09-22 MED ORDER — CLOMIPHENE CITRATE 50 MG PO TABS: 50.0000 mg | ORAL_TABLET | Freq: Every day | ORAL | Status: DC

## 2013-09-22 NOTE — Progress Notes (Signed)
Patient presents to the office today for further discussion of her secondary infertility. Patient has not been seen in the office since last year. She is a 37 year old gravida 1 para 0 AB 1 who stated that approximately 9 years ago in [place] she had a first trimester miscarriage and had a D&E. Several years ago patient then placed on clomiphene citrate for 2 cycles and had noticed positive response which was confirmed by normal serum progesterone level. She has been diagnosed with type 2 diabetes and is on metformin 500 mg twice a day but has not seen a physician in quite some time. Patient's partner had a semen analysis in 2010 whereby no agglutination was present normal viscosity was present normal volume and sperm count was 183 million. Normal forms 15% abnormal heads 68%.   In 2011 another provider had ordered an ultrasound and the reported demonstrated small cystic spaces in heterogeneity of the endometrium questionable polyps or hyperplasia and was to return back for followup endometrial biopsy which she did in February 2012 and the pathology report done by another provider had demonstrated the specimen consisted of minute strips of endometrial epithelium that appeared in active in may be from the lower uterine segment. No endometrium with intact glands and stroma were present. Patient had a normal Pap smear in June of this year. Her recent prolactin level was 7.2. Previous sonohysterogram had demonstrated the following:  Findings suspicious for adenomyosis. The several intraluminal  filling defects within the endometrial canal may represent stigmata  from the previously suspected cystic hyperplasia. Correlation with  more current pelvic ultrasound would be recommended with  sonohysterography as indicated.  Right tubal patency confirmed.  Incomplete filling of the left tube questionably due to  preferential spill from the right combined with inability to  increase pressure of the injection due to  catheter position  Patient had a sonohysterogram in August 2030 with the following findings: Uterus measured 8.1 x 6.0 x 3.1 cm with endometrial stripe of 10.5 mm. Prominent endometrial cavity with positive color flow was noted within the cavity. Right ovary was enlarged numerous follicles. Left ovary numerous follicles consistent with PCO S. Sonohysterogram demonstrated several entry uterine defect one measuring 21 x 6 x 17 mm and a second one measuring 23 x 6 x 25 mm suspicious for endometrial polyp  The patient eventually went to [facility] where she had a resectoscopic polypectomy for which we'll try to obtain the operative note. She states now that her cycles sometimes are occurring but very light and this past cycle she did ovulation test and it was positive. She is currently on day 23 of her cycle and we will check her serum progesterone level today. We'll also check her hemoglobin A1c.  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 She was reminded that she needs a full annual gynecological examination and Pap smear which is overdue. She will wait for the end of this month if she does not have a menstrual cycle she will take Provera 10 mg for 5-10 days and call the office so that we can coordinate intrauterine insemination after starting her on clomiphene citrate 100 mg and a 5 through 9. She will check first a home pregnancy test make sure she is not pregnant  before she takes the Provera. She was reminded of stain on the prenatal vitamins. The risks benefits and pros and cons of the medication was discussed. All the above was discussed with the patient in Spanish.

## 2013-09-22 NOTE — Patient Instructions (Signed)
Clomiphene tablets Qu es este medicamento? El CLOMIFENO es un medicamento para la fertilidad que se [place] para aumentar la posibilidad de [place]. [place] para estimular Nurse, children's (producir un huevo maduro) adecuada durante el ciclo de la [place]. Este medicamento puede ser utilizado para otros usos; si tiene alguna pregunta consulte con su proveedor de atencin mdica o con su farmacutico. Qu le debo informar a mi profesional de la salud antes de tomar este medicamento? Necesita saber si usted presenta alguno de los siguientes problemas o situaciones: -enfermedad de la glndula suprarrenal -enfermedad vascular, trastorno de coagulacin sangunea -quiste en los ovarios -endometriosis -enfermedad heptica -carcinoma de ovario -enfermedad de la glndula pituitaria -sangrado vaginal que no ha sido evaluado -una reaccin alrgica o inusual al clomifeno, a otros medicamentos, alimentos, colorantes o conservantes -si est embarazada (no debe usar si est embarazada) -si est amamantando a un beb Cmo debo utilizar este medicamento? Tome este medicamento por va oral con un vaso de agua. Siga las instrucciones de la etiqueta del [place]. Tomar exactamente segn se indica y durante el nmero exacto de das para los que fue recetado. Tome sus dosis a intervalos regulares. La [employer] de las mujeres toman este medicamento durante un perodo de 5 [place], Armed forces training and education officer la duracin del tratamiento puede ajustarse en algunos casos. Su mdico le Conservation officer, nature en que debe empezar a tomar este medicamento y Ship broker las indicaciones para Tour manager. No tome su medicamento con una frecuencia mayor que la indicada. Hable con su pediatra para informarse acerca del uso de este medicamento en nios. Puede requerir atencin especial. Sobredosis: Pngase en contacto inmediatamente con un centro toxicolgico o una sala de urgencia si usted cree que haya tomado demasiado medicamento. ATENCIN: [place] [employer] es solo para usted. No comparta este medicamento con nadie. Qu sucede si me olvido de una dosis? Si olvida una dosis, tmela lo antes posible. Si es casi la hora de la prxima dosis, tome slo esa dosis. No tome dosis adicionales o dobles. Qu puede interactuar con este medicamento? -suplementos a base de hierbas o dietticos como cohosh azul, cohosh negro, vitex o DHEA -prasterona Puede ser que esta lista no menciona todas las posibles interacciones. Informe a su profesional de [employer] Home	[place] de [employer] productos a base de hierbas, medicamentos de [place] o suplementos nutritivos que est tomando. Si usted fuma, consume bebidas alcohlicas o si utiliza drogas ilegales, indqueselo tambin a su profesional de KB Home	[place]. Algunas sustancias pueden interactuar con su medicamento. A qu debo estar atento al usar Coca-Cola? Asegrese que usted sepa cmo y cundo usar Coca-Cola. Debe saber cundo est ovulando y cundo debe Boeing sexuales a fin de incrementar las posibilidades de quedar [place]. Visite a su mdico o a su profesional de la salud para chequear su evolucin peridicamente. Es posible que deba controlar sus niveles de hormonas en la sangre o que le indique alguna prueba de orina domiciliaria para Aeronautical engineer ovulacin. Trate de no faltar a las citas. En comparacin con otros tratamientos para la fertilidad, este medicamento no aumenta mucho la posibilidad de Best boy un Geneticist, molecular. Aproximadamente 5 de cada 100 mujeres que toman este medicamento tienen la posibilidad de quedar embarazadas con [employer]. Si piensa que est embarazada deje de tomar este medicamento de inmediato y comunquese con su mdico o con su profesional de Technical sales engineer. Este medicamento no es para tratamientos a Barrister's clerk. La [employer] Farm de las mujeres que se benefician del uso de Max  medicamento obtienen [employer] Corporation de los tres primeros ciclos (meses). Su mdico o su profesional  de Facilities manager a Financial controller. Este medicamento generalmente se utiliza durante no ms de 6 ciclos de [place]. Puede experimentar mareos o somnolencia. No conduzca ni utilice maquinaria ni haga nada que Scientist, research (life sciences) en estado de alerta hasta que sepa cmo le afecta este medicamento. No se siente ni se ponga de pie con rapidez. Esto reduce el riesgo de mareos o [employer]. El consumir bebidas alcohlicas o el fumar tabaco puede disminuir las posibilidades de [place]. Limitar o dejar de consumir alcohol o el uso de tabaco durante su tratamiento para la fertilidad. Qu efectos secundarios puedo tener al [employer] este medicamento? Efectos secundarios que debe informar a su mdico o a Producer, television/film/video de la salud tan pronto como sea posible: -Therapist, art como erupcin cutnea, picazn o urticarias, hinchazn de la cara, labios o lengua -problemas respiratorios -cambios en la visin -retencin de lquidos -nuseas, vmito -hinchazn o dolor plvico -dolor abdominal severo -aumento de peso repentino Efectos secundarios que, por lo general, no requieren atencin mdica (debe informarlos a su mdico o a su profesional de la salud si persisten o si son molestos): -molestia en las mamas -sofocos -molestia plvica leve -nuseas leves Puede ser que esta lista no menciona todos los posibles efectos secundarios. Comunquese a su mdico por asesoramiento mdico [employer]lett[employer]ard. Usted puede informar los efectos secundarios a la FDA por telfono al 1-800-FDA-1088. Dnde debo guardar mi medicina? Mantngala fuera del alcance de los nios. Gurdela a Sanmina[employer]SCI, entre 15 y 30 grados C (87 y 27 grados F). Protjala del calor, la luz y la humedad. Deseche todo el medicamento que no haya utilizado, despus de la fecha de vencimiento. ATENCIN: Este folleto es un resumen. Puede ser que no cubra toda la posible informacin. Si usted tiene preguntas acerca  de esta medicina, consulte con su mdico, su farmacutico o su profesional de Radiographer, therapeutic.  2013, Elsevier/Gold Standard. (04/30/2007 11:32:00 AM)

## 2013-09-23 ENCOUNTER — Other Ambulatory Visit: Payer: Self-pay | Admitting: Anesthesiology

## 2013-09-23 DIAGNOSIS — R739 Hyperglycemia, unspecified: Secondary | ICD-10-CM

## 2013-09-23 LAB — PROGESTERONE: Progesterone: 9.2 ng/mL

## 2013-09-29 ENCOUNTER — Other Ambulatory Visit: Payer: Self-pay

## 2013-09-29 ENCOUNTER — Telehealth: Payer: Self-pay

## 2013-09-29 ENCOUNTER — Other Ambulatory Visit: Payer: Self-pay | Admitting: Gynecology

## 2013-09-29 DIAGNOSIS — N979 Female infertility, unspecified: Secondary | ICD-10-CM

## 2013-09-29 MED ORDER — CLOMIPHENE CITRATE 50 MG PO TABS: ORAL_TABLET | ORAL | Status: DC

## 2013-09-29 NOTE — Telephone Encounter (Signed)
Rx called in 

## 2013-09-29 NOTE — Telephone Encounter (Signed)
Patient said [facility] said the Clomid Rx you escribed 09/22/13 not there. I was going to reorder it but noticed that you prescribed #10 but directions say "take one daily".  Your office note said 100mg  daily D5-9 of cycle.  Ok to re-escribe with "take two daily d5-9 of cycle"

## 2013-09-29 NOTE — Telephone Encounter (Signed)
Yesterday only, and 50 mg tablet. Patient to take 2 tablets daily from day 5 through 9. Total #10

## 2013-10-15 ENCOUNTER — Encounter: Payer: Self-pay | Admitting: Gynecology

## 2013-10-15 ENCOUNTER — Ambulatory Visit (INDEPENDENT_AMBULATORY_CARE_PROVIDER_SITE_OTHER): Payer: Self-pay | Admitting: Gynecology

## 2013-10-15 DIAGNOSIS — N915 Oligomenorrhea, unspecified: Secondary | ICD-10-CM

## 2013-10-15 DIAGNOSIS — N979 Female infertility, unspecified: Secondary | ICD-10-CM

## 2013-10-15 DIAGNOSIS — R869 Unspecified abnormal finding in specimens from male genital organs: Secondary | ICD-10-CM

## 2013-10-15 MED ORDER — CLOMIPHENE CITRATE 50 MG PO TABS: ORAL_TABLET | ORAL | Status: DC

## 2013-10-15 NOTE — Progress Notes (Signed)
Patient presented to the office today for intrauterine insemination after stimulation with clomiphene citrate 100 mg a day 5 through 9 of her cycle. 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 Patient's semen analysis had demonstrated normal count but wasn't viscus. She has responded to the clomiphene citrate in the past and was tested with a mid secretory progesterone level.  It appears that patient may have missed the day for the insemination today because she stated that her first day of her last menstrual period was November 1 and she took clomiphene citrate from November 5 through the ninth. She stated that she noted a positive change on her ovulation predictor kit yesterday and the day before but not today.  Intrauterine insemination with partner's semen after sperm washing was accomplished. Patient had a somewhat angulated and stenotic cervical os requiring dilatation.  Patient will await to see if she conceives the cycle if not we will repeat once again with the clomiphene citrate 100 mg a day 5 through 9. She was reminded of the importance of contacting the office on the first day that she notices change on her ovulation predictor kit so that we can schedule for the insemination. We'll also ask her to refrain from intercourse after the last clomiphene citrate tablet. All the above was discussed in [place].

## 2014-01-14 ENCOUNTER — Telehealth: Payer: Self-pay | Admitting: *Deleted

## 2014-01-14 DIAGNOSIS — N979 Female infertility, unspecified: Secondary | ICD-10-CM

## 2014-01-14 MED ORDER — CLOMIPHENE CITRATE 50 MG PO TABS: ORAL_TABLET | ORAL | Status: DC

## 2014-01-14 NOTE — Telephone Encounter (Signed)
Rosemarie Ax will inform the patient. Will call with positive OPK to set up for IUI. KW CMA RX clomid sent

## 2014-01-14 NOTE — Telephone Encounter (Signed)
Yes and to take it for five days and follow instructions previously given.

## 2014-01-14 NOTE — Telephone Encounter (Signed)
Pts LMP 2/13, she was suppose to start Clomid yesterday. She didn't have any refills. Is ok to start today D6? If not she is ok waiting til next month. She is wanting to have IUI with the next Clomid cycle she is doing. Please advise. Thank you

## 2014-01-21 ENCOUNTER — Encounter: Payer: Self-pay | Admitting: Gynecology

## 2014-01-21 ENCOUNTER — Ambulatory Visit (INDEPENDENT_AMBULATORY_CARE_PROVIDER_SITE_OTHER): Payer: Self-pay | Admitting: Gynecology

## 2014-01-21 VITALS — BP 124/80

## 2014-01-21 DIAGNOSIS — N979 Female infertility, unspecified: Secondary | ICD-10-CM

## 2014-01-21 DIAGNOSIS — S39012A Strain of muscle, fascia and tendon of lower back, initial encounter: Secondary | ICD-10-CM | POA: Insufficient documentation

## 2014-01-21 NOTE — Patient Instructions (Addendum)
Distensión lumbosacra  ( Lumbosacral Strain)    La distensión lumbosacra es una distensión de cualquiera de las partes que componen las vértebras lumbosacras. Las vértebras lumbosacras son los huesos que conforman el tercio inferior de la columna vertebral. Estas vértebras están sostenidas por músculos y un resistente tejido fibroso (ligamentos).   CAUSAS   Un golpe repentino en la espalda puede provocar una distensión lumbosacra. Además, cualquier tipo de movimiento que cause una elongación excesiva de los músculos de la zona lumbar puede provocar este tipo de distensión. Esto se ve normalmente en las personas que se esfuerzan demasiado, se caen, levantan objetos pesados, se agachan o están en cuclillas con regularidad.  FACTORES DE RIESGO  · Trabajo agotador.  · Participar en deportes en los cuales se deba empujar o tirar y que requieren de un giro repentino de la espalda (tenis, golf, béisbol).  · Levantar peso.  · Curvatura excesiva de la zona lumbar.  · Pelvis hacia adelante.  · Espalda o músculos abdominales débiles, o ambos.  · Tendones isquiotibiales tensos.  SIGNOS Y SÍNTOMAS   La distensión lumbosacra puede provocar dolor en la zona de la lesión o un dolor que baja (se extiende) hasta la pierna.   DIAGNÓSTICO  Con frecuencia, el médico puede diagnosticar una distensión lumbosacra mediante un examen físico. En algunos casos, es posible que deba realizarse pruebas, como una radiografía.   TRATAMIENTO   El tratamiento para la lesión lumbar depende de muchos factores que el médico deberá evaluar. Sin embargo, la mayoría de los tratamientos incluye el uso de antiinflamatorios.  INSTRUCCIONES PARA EL CUIDADO EN EL HOGAR   · Evite actividades físicas difíciles (tenis, raquetbol, esquí acuático) si no tiene un buen estado físico para practicarlas. Esto puede agravar o provocar problemas.  · Si tiene un problema en la espalda, evite los deportes que requieren de movimientos corporales bruscos. La natación y las  caminatas son las actividades más seguras.  · Mantenga una buena postura.  · Mantenga un peso saludable.  · En el caso de episodios agudos, puede colocar hielo en la zona lesionada.  · Ponga el hielo en una bolsa plástica.  · Coloque una toalla entre la piel y la bolsa de hielo.  · Deje el hielo durante 20 minutos, 2 a 3 veces por día.  · Cuando la zona lumbar comience a sanar, es posible que le recomienden ejercicios de elongación y fortalecimiento.  SOLICITE ATENCIÓN MÉDICA SI:  · El dolor de espalda empeora.  · Tiene un dolor de espalda intenso que no mejora con medicamentos.  SOLICITE ATENCIÓN MÉDICA DE INMEDIATO SI:   · Siente entumecimiento, hormigueo, debilidad o problemas con el uso de los brazos o las piernas.  · Nota cambios en el control de la vejiga o el intestino.  · Siente un aumento del dolor en cualquier parte del cuerpo, incluido el vientre (abdomen).  · Nota que le falta el aire, se siente mareado o se desmaya.  · Tiene malestar estomacal (náuseas), vomita o comienza a sudar.  · Nota un cambio de color en los dedos del pie o las piernas, o los pies se ponen muy fríos.  ASEGÚRESE DE QUE:   · Comprende estas instrucciones.  · Controlará su afección.  · Recibirá ayuda de inmediato si no mejora o si empeora.  Document Released: 08/23/2005 Document Revised: 09/03/2013  ExitCare® Patient Information ©2014 ExitCare, LLC.

## 2014-01-21 NOTE — Progress Notes (Signed)
   Patient presented to the office today to discuss further her secondary infertility. Please see previous note dated 09/22/2013 for summary. Patient with history of PCO S. and insulin resistance. Patient has been complaining of low back discomfort for the past several months and went to see some community "healer" but did some manipulation on her back and felt better but her symptoms have returned. Patient denies any radiculopathy. She works standing most of the time not engage in any lifting. She did state many years ago she did fall on her tailbone but no x-rays or any reported fractures.  Patient's last menstrual period was 2 weeks ago she took a clomiphene citrate 100 mg from day 5-9 and had intercourse 2 days before she noted the ovulation predictor kit change color to indicate that she was ovulating.  Exam: Back: Tenderness in the lumbosacral region In the supine position patient had discomfort/pain on 30 leg raises. She had no obturator or heel tap sign present  Assessment/plan: In the event that she may be early pregnant we will hold off on schedule an MRI of the lumbosacral region and refer her to the orthopedic surgeon. Meanwhile she'll continue on ibuprofen when necessary. Once the evaluation has been completed she will return back to the office so that we can pursue ovulation induction and help her with her secondary infertility. All these instructions were provided in Spanish.

## 2014-02-20 ENCOUNTER — Telehealth: Payer: Self-pay | Admitting: *Deleted

## 2014-02-20 MED ORDER — METFORMIN HCL 500 MG PO TABS: 500.0000 mg | ORAL_TABLET | Freq: Two times a day (BID) | ORAL | Status: DC

## 2014-02-20 NOTE — Telephone Encounter (Signed)
Message copied by Thamas Jaegers on Fri Feb 20, 2014  9:29 AM ------      Message from: Sinclair Grooms      Created: Fri Feb 20, 2014  9:14 AM      Regarding: RX       Can you ask JF if he will refill metFORMIN (GLUCOPHAGE) 500 MG tablet.  She was suppose to come back for fasting glucose in November 2014 but she is private pay and does not have the money to pay for the lab. Can he refill it one more time and come back later for labs. Thx              ------

## 2014-02-20 NOTE — Telephone Encounter (Signed)
Please see below note

## 2014-02-20 NOTE — Telephone Encounter (Signed)
rx sent claudia will inform patient.

## 2014-02-20 NOTE — Telephone Encounter (Signed)
Please call a prescription for metformin 500 mg 1 by mouth twice a day #65 refills. Reminder of her appointment

## 2014-09-28 ENCOUNTER — Encounter: Payer: Self-pay | Admitting: Gynecology

## 2016-07-31 ENCOUNTER — Encounter (HOSPITAL_COMMUNITY): Payer: Self-pay

## 2016-07-31 DIAGNOSIS — R079 Chest pain, unspecified: Secondary | ICD-10-CM | POA: Insufficient documentation

## 2016-07-31 DIAGNOSIS — Z7984 Long term (current) use of oral hypoglycemic drugs: Secondary | ICD-10-CM | POA: Insufficient documentation

## 2016-07-31 DIAGNOSIS — Z87891 Personal history of nicotine dependence: Secondary | ICD-10-CM | POA: Insufficient documentation

## 2016-07-31 NOTE — ED Triage Notes (Signed)
Pt states that she started having left sided CP that started about an hour ago, denies n/v and radiation. States pain is a pressure 4/10

## 2016-08-01 ENCOUNTER — Emergency Department (HOSPITAL_COMMUNITY): Payer: Self-pay

## 2016-08-01 ENCOUNTER — Emergency Department (HOSPITAL_COMMUNITY)
Admission: EM | Admit: 2016-08-01 | Discharge: 2016-08-01 | Disposition: A | Discharge status: Home / Self Care | Payer: Self-pay | Attending: Emergency Medicine | Admitting: Emergency Medicine

## 2016-08-01 DIAGNOSIS — R079 Chest pain, unspecified: Secondary | ICD-10-CM

## 2016-08-01 LAB — BASIC METABOLIC PANEL
Anion gap: 9 (ref 5–15)
BUN: 14 mg/dL (ref 6–20)
CHLORIDE: 99 mmol/L — AB (ref 101–111)
CO2: 24 mmol/L (ref 22–32)
CREATININE: 0.61 mg/dL (ref 0.44–1.00)
Calcium: 9.3 mg/dL (ref 8.9–10.3)
GFR calc Af Amer: >60 mL/min (ref >60)
GFR calc non Af Amer: >60 mL/min (ref >60)
GLUCOSE: 233 mg/dL — AB (ref 65–99)
Potassium: 3.9 mmol/L (ref 3.5–5.1)
Sodium: 132 mmol/L — ABNORMAL LOW (ref 135–145)

## 2016-08-01 LAB — I-STAT TROPONIN, ED
Troponin i, poc: 0 ng/mL (ref 0.00–0.08)
Troponin i, poc: 0 ng/mL (ref 0.00–0.08)

## 2016-08-01 LAB — I-STAT BETA HCG BLOOD, ED (MC, WL, AP ONLY): I-stat hCG, quantitative: <5 m[IU]/mL (ref <5)

## 2016-08-01 LAB — D-DIMER, QUANTITATIVE: D-Dimer, Quant: <0.27 ug/mL-FEU (ref 0.00–0.50)

## 2016-08-01 LAB — CBC
HCT: 42.1 % (ref 36.0–46.0)
Hemoglobin: 15.1 g/dL — ABNORMAL HIGH (ref 12.0–15.0)
MCH: 30 pg (ref 26.0–34.0)
MCHC: 35.9 g/dL (ref 30.0–36.0)
MCV: 83.5 fL (ref 78.0–100.0)
PLATELETS: 226 10*3/uL (ref 150–400)
RBC: 5.04 MIL/uL (ref 3.87–5.11)
RDW: 12.3 % (ref 11.5–15.5)
WBC: 9.6 10*3/uL (ref 4.0–10.5)

## 2016-08-01 MED ORDER — ONDANSETRON 4 MG PO TBDP
4.0000 mg | ORAL_TABLET | Freq: Once | ORAL | Status: AC
  Administered 2016-08-01: 4 mg via ORAL
  Filled 2016-08-01: qty 1

## 2016-08-01 MED ORDER — IBUPROFEN 800 MG PO TABS: 800.0000 mg | ORAL_TABLET | Freq: Three times a day (TID) | ORAL | 0 refills | Status: DC | PRN

## 2016-08-01 MED ORDER — IBUPROFEN 800 MG PO TABS
800.0000 mg | ORAL_TABLET | Freq: Once | ORAL | Status: AC
Start: 2016-08-01 — End: 2016-08-01
  Administered 2016-08-01: 800 mg via ORAL
  Filled 2016-08-01: qty 1

## 2016-08-01 MED ORDER — ONDANSETRON 4 MG PO TBDP: 4.0000 mg | ORAL_TABLET | Freq: Three times a day (TID) | ORAL | 0 refills | Status: DC | PRN

## 2016-08-01 NOTE — Discharge Instructions (Signed)
Your cardiac labs today were normal. Your chest x-ray was normal. A blood test called a d-dimer was negative rituals out blood clots. I recommend outpatient follow-up with her primary care physician if symptoms continue. It does not appear you're having a heart attack, blood clot, pneumonia, fluid on your lungs.   To find a primary care or specialty doctor please call [phone] or [phone] to access "[place] a Doctor Service."  You may also go on the [facility] website at [email or web address]  There are also multiple [facility] [place] and [facility] practices throughout the [place] that are frequently accepting new patients. You may find a clinic that is close to your home and contact them.  [facility] -  [address] [place] [phone]  [facility] in [place] (also locations in [place] and [place]) -  [place] [address]  [place] [address] [phone]

## 2016-08-01 NOTE — ED Notes (Signed)
Pt brought to room

## 2016-08-01 NOTE — ED Provider Notes (Signed)
TIME SEEN:  By signing my name below, I, Arianna Nassar, attest that this documentation has been prepared under the direction and in the presence of [employer], DO.  Electronically Signed: Julien Nordmann, ED Scribe. 08/01/16. 2:15 AM.   CHIEF COMPLAINT:  Chief Complaint  Patient presents with  . Chest Pain    HPI:  HPI Comments: Chloe Perez is a 40 y.o. female who has a PMhx of DMII presents to the Emergency Department complaining of sudden onset, gradual worsening, moderate, intermittent, left sided pressure like chest pain onset 10:30 pm this evening (~ 3.5 hours ago). Associated intermittent shortness of breath and nausea. Pt says certain movement Especially rolling onto her right side and any "effort" increases her pain. She has not taken any medication to alleviate her pain. Pt reports that she has not had this kind of pain before in the past. Pt says her father had a MI at age 21. She is currently getting in vitro fertilization. Pt has no hx of HTN, hyperlipidemia or tobacco use. No history of PE, DVT, exogenous estrogen use, fracture, surgery, trauma, hospitalization, prolonged travel. No lower extremity swelling or pain. No calf tenderness.  No nausea, vomiting, diaphoresis or dizziness.  Spanish interpretor used.   ROS: See HPI Constitutional: no fever  Eyes: no drainage  ENT: no runny nose   Cardiovascular: chest pain  Resp:  SOB  GI: no vomiting GU: no dysuria Integumentary: no rash  Allergy: no hives  Musculoskeletal: no leg swelling  Neurological: no slurred speech ROS otherwise negative  PAST MEDICAL HISTORY/PAST SURGICAL HISTORY:  Past Medical History:  Diagnosis Date  . Missed ab     MEDICATIONS:  Prior to Admission medications   Medication Sig Start Date End Date Taking? Authorizing Provider  clomiPHENE (CLOMID) 50 MG tablet Take 2 tablets daily from day 5-9 of cycle 01/14/14   Terrance Mass, MD  Folic Acid [id number] MCG TABS Take by mouth.     Historical Provider, MD  medroxyPROGESTERone (PROVERA) 10 MG tablet Take 1 tablet for 5-10 days to start menstrual cycle if one has not started spontaneously after 30 days. 09/22/13   Terrance Mass, MD  metFORMIN (GLUCOPHAGE) 500 MG tablet Take 1 tablet (500 mg total) by mouth 2 (two) times daily with a meal. 02/20/14   Terrance Mass, MD  NON [place]    Historical Provider, MD  [place]     Historical Provider, MD    ALLERGIES:  No Known Allergies  SOCIAL HISTORY:  Social History  Substance Use Topics  . Smoking status: Former Research scientist (life sciences)  . Smokeless tobacco: Never Used  . Alcohol use Yes     Comment: OCC    FAMILY HISTORY: No family history on file.  EXAM: BP 144/78 (BP Location: Right Arm)   Pulse 69   Temp 97.9 F (36.6 C) (Oral)   Resp 18   Wt 174 lb (78.9 kg)   LMP 07/01/2016   SpO2 97%  CONSTITUTIONAL: Alert and oriented and responds appropriately to questions. Well-appearing; well-nourished HEAD: Normocephalic EYES: Conjunctivae clear, PERRL ENT: normal nose; no rhinorrhea; moist mucous membranes NECK: Supple, no meningismus, no LAD  CARD: RRR; S1 and S2 appreciated; no murmurs, no clicks, no rubs, no gallops CHEST: chest is non tender to palpation, no ecchymosis, crepitus, deformity or other lesions RESP: Normal chest excursion without splinting or tachypnea; breath sounds clear and equal bilaterally; no wheezes, no rhonchi, no rales, no hypoxia or respiratory distress,  speaking full sentences ABD/GI: Normal bowel sounds; non-distended; soft, non-tender, no rebound, no guarding, no peritoneal signs BACK:  The back appears normal and is non-tender to palpation, there is no CVA tenderness EXT: Normal ROM in all joints; non-tender to palpation; no edema; normal capillary refill; no cyanosis, no calf tenderness or swelling    SKIN: Normal color for age and race; warm; no rash NEURO: Moves all extremities equally, sensation to light  touch intact diffusely, cranial nerves II through XII intact PSYCH: The patient's mood and manner are appropriate. Grooming and personal hygiene are appropriate.  MEDICAL DECISION MAKING: Patient here with chest pain. Does have diabetes and family history of CAD. First troponin ordered in triage is negative. EKG shows no ischemic abnormality. Chest x-ray clear. Pain has almost completely resolved on its own. Seems to be very atypical, worse when rolling onto her right side. She is currently receiving treatment for IVF. Currently states she is on Provera but no other hormones. Will repeat second troponin, d-dimer and an hCG. Will give ibuprofen, Zofran and reassess.  ED PROGRESS: Patient still hemodynamically stable and the pain has resolved. Second troponin negative. D-dimer negative. She is not pregnant. I feel she is safe to be discharged home with close outpatient follow-up. Discussed return precautions. Patient and husband are comfortable with this plan.   At this time, I do not feel there is any life-threatening condition present. I have reviewed and discussed all results (EKG, imaging, lab, urine as appropriate), exam findings with patient/family. I have reviewed nursing notes and appropriate previous records.  I feel the patient is safe to be discharged home without further emergent workup and can continue workup as an outpatient as needed. Discussed usual and customary return precautions. Patient/family verbalize understanding and are comfortable with this plan.  Outpatient follow-up has been provided. All questions have been answered.    EKG Interpretation  Date/Time:  Monday July 31 2016 23:41:55 EDT Ventricular Rate:  73 PR Interval:  176 QRS Duration: 80 QT Interval:  402 QTC Calculation: 442 R Axis:   52 Text Interpretation:  Normal sinus rhythm Normal ECG No old tracing to compare Confirmed by Jya Hughston,  DO, Abubakar Crispo ([id number]) on 08/01/2016 1:51:43 AM       I personally performed  the services described in this documentation, which was scribed in my presence. The recorded information has been reviewed and is accurate.    [place], DO 08/01/16 [phone]

## 2017-01-25 ENCOUNTER — Encounter (INDEPENDENT_AMBULATORY_CARE_PROVIDER_SITE_OTHER): Payer: Self-pay | Admitting: Physician Assistant

## 2017-01-25 ENCOUNTER — Ambulatory Visit (INDEPENDENT_AMBULATORY_CARE_PROVIDER_SITE_OTHER): Payer: Self-pay | Admitting: Physician Assistant

## 2017-01-25 VITALS — BP 122/75 | HR 76 | Temp 97.8°F | Ht 60.0 in | Wt 160.2 lb

## 2017-01-25 DIAGNOSIS — D239 Other benign neoplasm of skin, unspecified: Secondary | ICD-10-CM

## 2017-01-25 DIAGNOSIS — N979 Female infertility, unspecified: Secondary | ICD-10-CM

## 2017-01-25 DIAGNOSIS — E118 Type 2 diabetes mellitus with unspecified complications: Secondary | ICD-10-CM

## 2017-01-25 DIAGNOSIS — R1031 Right lower quadrant pain: Secondary | ICD-10-CM

## 2017-01-25 LAB — POCT URINALYSIS DIPSTICK
BILIRUBIN UA: NEGATIVE
Glucose, UA: 1000
KETONES UA: 15
LEUKOCYTES UA: NEGATIVE
Nitrite, UA: NEGATIVE
Protein, UA: NEGATIVE
Spec Grav, UA: 1.02
Urobilinogen, UA: 0.2
pH, UA: 6

## 2017-01-25 LAB — POCT GLYCOSYLATED HEMOGLOBIN (HGB A1C): Hemoglobin A1C: 8.5

## 2017-01-25 MED ORDER — GLIMEPIRIDE 4 MG PO TABS: 4.0000 mg | ORAL_TABLET | Freq: Every day | ORAL | 1 refills | Status: DC

## 2017-01-25 NOTE — Progress Notes (Signed)
Subjective:  Patient ID: Chloe Perez, female    DOB: Nov 20, 1976  Age: 41 y.o. MRN: [id number]  CC: establish care  HPI Chloe Perez is a 41 y.o. female with a Medical history of infertility and diabetes presents today to establish care for these issues. Says that she used to receive care for infertility at an outside clinic in [place], [place]. She does not know the results of any of the testing and says that's previous treatments with the outside clinic have failed. She is confused as to what has been found as the cause of her infertility. Last menstrual period 3 weeks ago. Menstrual periods described as irregular. Right sided adnexal pain preceded by a heavy flow of vaginal bleeding 1 day but bleeding subsided by the next day. She is also diabetic and has been taking glimepiride. Says that she has been told to stop metformin because she has a "bad liver". Does not endorse polyuria, polydipsia, polyphagia, or visual disturbance. Does endorse some mild fatigue. Does not check glucose at home.   Outpatient Medications Prior to Visit  Medication Sig Dispense Refill  . clomiPHENE (CLOMID) 50 MG tablet Take 2 tablets daily from day 5-9 of cycle 10 tablet 0  . Folic Acid [id number] MCG TABS Take by mouth.    Marland Kitchen ibuprofen (ADVIL,MOTRIN) 800 MG tablet Take 1 tablet (800 mg total) by mouth every 8 (eight) hours as needed for mild pain. 30 tablet 0  . medroxyPROGESTERone (PROVERA) 10 MG tablet Take 1 tablet for 5-10 days to start menstrual cycle if one has not started spontaneously after 30 days. 10 tablet 2  . metFORMIN (GLUCOPHAGE) 500 MG tablet Take 1 tablet (500 mg total) by mouth 2 (two) times daily with a meal. 60 tablet 1  . NON FORMULARY FEM OMNILIFE    . ondansetron (ZOFRAN ODT) 4 MG disintegrating tablet Take 1 tablet (4 mg total) by mouth every 8 (eight) hours as needed for nausea or vomiting. 20 tablet 0  . OVER THE COUNTER MEDICATION      No facility-administered  medications prior to visit.      ROS Review of Systems  Constitutional: Negative for chills, fever and malaise/fatigue.  Eyes: Negative for blurred vision.  Respiratory: Negative for shortness of breath.   Cardiovascular: Negative for chest pain and palpitations.  Gastrointestinal: Positive for abdominal pain (Right adnexal region.). Negative for nausea.  Genitourinary: Negative for dysuria and hematuria.  Musculoskeletal: Negative for joint pain and myalgias.  Skin: Negative for rash.  Neurological: Negative for tingling and headaches.  Endo/Heme/Allergies: Negative for polydipsia.  Psychiatric/Behavioral: Negative for depression. The patient is not nervous/anxious.     Objective:  BP 122/75 (BP Location: Left Arm, Patient Position: Sitting, Cuff Size: Normal)   Pulse 76   Temp 97.8 F (36.6 C) (Oral)   Ht 5' (1.524 m)   Wt 160 lb 3.2 oz (72.7 kg)   LMP 12/28/2016 (Exact Date)   SpO2 98%   BMI 31.29 kg/m   BP/Weight 01/25/2017 0000000 [id number]  Systolic BP 123XX123 123XX123 -  Diastolic BP 75 77 -  Wt. (Lbs) 160.2 - 174  BMI 31.29 - -      Physical Exam  Constitutional: She is oriented to person, place, and time.  Overweight, NAD, polite  HENT:  Head: Normocephalic and atraumatic.  Eyes: No scleral icterus.  Neck: Normal range of motion. Neck supple. No thyromegaly present.  Cardiovascular: Normal rate, regular rhythm and normal heart sounds.   Pulmonary/Chest: Effort normal and  breath sounds normal.  Abdominal: Soft. Bowel sounds are normal. There is tenderness (Mild tenderness to palpation in the right adnexal region).  Musculoskeletal: She exhibits no edema.  Neurological: She is alert and oriented to person, place, and time.  Skin: Skin is warm and dry. No rash noted. No erythema. No pallor.  Small, rounded, firm nodule on the left forearm. Nontender, no cellulitis, no induration, no ecchymosis, no edema.  Psychiatric: She has a normal mood and affect. Her behavior is  normal. Thought content normal.  Vitals reviewed.    Assessment & Plan:   1. Right lower quadrant abdominal pain - Mild right sided adnexal pain. Suspected ovarian cyst? - CBC with Differential/Platelet - US Pelvis Complete - US Transvaginal Non-OB; Future  2. Type 2 diabetes mellitus with complication, without long-term current use of insulin (HCC) - Urinalysis Dipstick glucose and ketones - No CBG available in clinic - A1c 8.5 in clinic today - Advised pt to begin testing her blood sugar again with her home glucometer and brain glucose log at next visit. - We will add metformin if there are no indications of renal dysfunction or liver disease. - Comprehensive metabolic panel  - glimepiride (AMARYL) 4 MG tablet; Take 1 tablet (4 mg total) by mouth daily before breakfast.  Dispense: 90 tablet; Refill: 1  3. Infertility, female - Per pt hx, received care at outside clinic, no records available for review - TSH - FSH/LH - Ambulatory referral to Gynecology   4. Dermatofibroma - No treatment necessary  *[place] card application and [place] discomfort application given to patient. I advised her to fill out these applications to facilitate treatment here and referral locations.    Meds ordered this encounter  Medications  . glimepiride (AMARYL) 4 MG tablet    Sig: Take 1 tablet (4 mg total) by mouth daily before breakfast.    Dispense:  90 tablet    Refill:  1    Order Specific Question:   Supervising Provider    Answer:   Tresa Garter [id number]    Follow-up: Return in about 2 weeks (around 02/08/2017) for pelvic pain, diabetes.   Clent Demark PA

## 2017-01-25 NOTE — Patient Instructions (Signed)
Diabetes mellitus y actividad fsica (Diabetes Mellitus and Exercise) Hacer actividad fsica habitualmente es importante para el estado de salud general, en especial si tiene diabetes (diabetes mellitus). La actividad fsica no solo se reduce a bajar de peso. Aporta muchos beneficios para la salud, como aumentar la fuerza muscular y la densidad sea, y reducir las grasas corporales y el estrs. Esto mejora el estado fsico, la flexibilidad y la resistencia, y todo ello redunda en un mejor estado de salud general. La actividad fsica tiene beneficios adicionales para los diabticos, entre ellos:  Disminuye el apetito.  Ayuda a bajar y mantener la glucemia bajo control.  Baja la presin arterial.  Ayuda a controlar las cantidades de sustancias grasas (lpidos) en la sangre, como el colesterol y los triglicridos.  Mejora la respuesta del cuerpo a la insulina (optimizacin de la sensibilidad a la insulina).  Reduce la cantidad de insulina que el cuerpo necesita.  Reduce el riesgo de sufrir cardiopata coronaria de la siguiente forma:  Baja los niveles de colesterol y triglicridos.  Aumenta los niveles de colesterol bueno.  Disminuye la glucemia. QU ES EL PLAN DE ACTIVIDADES? El mdico o un educador para la diabetes certificado pueden ayudarlo a elaborar un plan respecto del tipo y de la frecuencia de actividad fsica (plan de actividades) adecuado para usted. Asegrese de lo siguiente:  Haga por lo menos 150minutos semanales de ejercicios de intensidad moderada o vigorosa. Estos podran ser caminatas dinmicas, ciclismo o gimnasia acutica.  Haga ejercicios de elongacin y de fortalecimiento, como yoga o levantamiento de pesas, por lo menos 2veces por semana.  Reparta la actividad en al menos 3das de la semana.  Haga algn tipo de actividad fsica todos los das.  No deje pasar ms de 2das seguidos sin hacer algn tipo de actividad fsica.  No permanezca inactivo durante  ms de 90minutos seguidos. Tmese descansos frecuentes para caminar o estirarse.  Elija un tipo de ejercicio o de actividad que disfrute y establezca objetivos realistas.  Comience lentamente y aumente de manera gradual la intensidad del ejercicio con el correr del tiempo. QU DEBO SABER ACERCA DEL CONTROL DE LA DIABETES?  Contrlese la glucemia antes y despus de ejercitarse.  Si la glucemia supera los 240mg/dl (13,3mmol/l) antes de ejercitarse, hgase un control de la orina para detectar la presencia de cetonas. Si tiene cetonas en la orina, no se ejercite hasta que la glucemia se normalice.  Conozca los sntomas de la glucemia baja (hipoglucemia) y aprenda cmo tratarla. El riesgo de tener hipoglucemia aumenta durante y despus de hacer actividad fsica. Los sntomas frecuentes de hipoglucemia pueden incluir los siguientes:  Hambre.  Ansiedad.  Sudoracin y piel hmeda.  Confusin.  Mareos o sensacin de desvanecimiento.  Aumento de la frecuencia cardaca o palpitaciones.  Visin borrosa.  Hormigueo o adormecimiento alrededor de la boca, los labios o la lengua.  Temblores o sacudidas.  Irritabilidad.  Tenga una colacin de carbohidratos de accin rpida disponible antes, durante y despus de ejercitarse, a fin de evitar o tratar la hipoglucemia.  Evite inyectarse insulina en las zonas del cuerpo que ejercitar. Por ejemplo, evite inyectarse insulina en:  Los brazos, si juega al tenis.  Las piernas, si corre.  Lleve registros de sus hbitos de actividad fsica. Esto puede ayudarlos a usted y al mdico a adaptar el plan de control de la diabetes segn sea necesario. Escriba los siguientes datos:  Los alimentos que consume antes y despus de hacer actividad fsica.  Los niveles de glucosa en   la sangre antes y despus de hacer [place].  El tipo y cantidad de [place] fsica que Musician.  Cuando se prev que la insulina alcance su valor mximo, si [place] insulina.  No haga actividad fsica en los momentos en que insulina alcanza su valor mximo.  Cuando comience un ejercicio o una actividad nuevos, trabaje con el mdico para asegurarse de que la actividad sea segura para usted y para Secretary/administrator la [place], los medicamentos o la ingesta de alimentos segn sea necesario.  Beba gran cantidad de agua mientras hace ejercicios para evitar la deshidratacin o los golpes de Freight forwarder. Beba suficiente lquido para Consulting civil engineer orina clara o de color amarillo plido. Esta informacin no tiene Marine scientist el consejo del mdico. Asegrese de hacerle al mdico cualquier pregunta que tenga. Document Released: 12/03/2007 Document Revised: 03/06/2016 Document Reviewed: 04/24/2016 Elsevier Interactive Patient Education  2017 [place] (Infertility) La infertilidad es no poder lograr el Media planner (concebir) despus de un ao de Theatre manager relaciones sexuales regularmente sin usar ningn mtodo de control de la natalidad. La infertilidad tambin puede significar que una mujer no puede llevar el embarazo a trmino. Tanto hombres como mujeres pueden tener problemas de fertilidad. CULES SON LAS CAUSAS DE LA INFERTILIDAD? Cules son las causas de la infertilidad en las mujeres? Hay muchas causas posibles de infertilidad en las mujeres. En algunas mujeres, no hay ninguna causa aparente de infertilidad (infertilidad idioptica). La infertilidad tambin puede estar vinculada a ms de [place]. Los problemas de infertilidad en las mujeres pueden deberse a problemas en el ciclo menstrual o los rganos reproductivos, ciertas afecciones mdicas y factores relacionados con la edad y el estilo de vida.  Los problemas en el ciclo menstrual pueden interferir con los ovarios que producen los vulos (ovulacin). Esto puede causar dificultad para quedar embarazada e incluye tener un ciclo menstrual muy largo, muy corto o irregular.  Los problemas relacionados con los  rganos reproductivos incluyen lo siguiente:  Un cuello de tero anormalmente angosto o que no permanece cerrado durante el [place].  Una obstruccin en las trompas de [place].  Un tero de forma anormal.  Fibromas uterinos. Estos son masas de tejido (tumores) que pueden Therapist, art.  Las afecciones mdicas que pueden afectar la fertilidad en las mujeres incluyen lo siguiente:  Sndrome de ovario poliqustico (SOP). Este es un trastorno hormonal que produce pequeos quistes en los ovarios y es la causa ms frecuente de infertilidad en las mujeres.  Endometriosis. Es una enfermedad en la que el tejido que recubre el tero (endometrio) crece fuera de su ubicacin normal.  Insuficiencia ovrica primaria. Esto es cuando los ovarios dejan de producir vulos y hormonas antes de los 37aos de [place].  Enfermedades de transmisin sexual (ETS), como la clamidia o la gonorrea. Estas infecciones pueden provocar fibrosis en las trompas de [place], lo que disminuye la probabilidad de que los vulos lleguen al tero.  Trastornos autoinmunitarios. Estas son afecciones en las que el sistema inmunitario ataca las clulas normales y saludables.  Desequilibrios hormonales.  Otros factores incluyen los siguientes:  La edad. La fertilidad en las mujeres declina con la edad, especialmente despus de los 35aos.  Tener bajo peso o sobrepeso.  Beber alcohol en exceso.  Consumir drogas.  Hacer ejercicio en exceso.  La exposicin a toxinas ambientales, como la radiacin, los plaguicidas y ciertos qumicos. Cules son las causas de la infertilidad en los hombres? Hay muchas causas de infertilidad en los hombres. La infertilidad  puede estar vinculada a ms de [employer]. Los problemas de infertilidad en los hombres pueden deberse a problemas con los espermatozoides o los rganos reproductivos, ciertas afecciones mdicas y factores relacionados con la edad y el estilo de vida. Algunos hombres  tienen infertilidad idioptica.  Problemas con los espermatozoides. La infertilidad puede ser consecuencia de un problema para producir lo siguiente:  Suficientes espermatozoides (bajo recuento de espermatozoides).  Suficientes espermatozoides con forma normal (morfologa de los espermatozoides).  Espermatozoides que puedan llegar al vulo (motilidad deficiente).  Las causas de la infertilidad tambin incluyen las siguientes:  Un problema con las hormonas.  Venas agrandadas (varicocele), quistes (espermatocele) o tumores en los testculos.  Disfuncin sexual.  Ardelia Mems lesin en los testculos.  Un defecto de nacimiento, como no tener los conductos que transportan los espermatozoides (conductos deferentes).  Algunas de las afecciones mdicas que pueden afectar la fertilidad en los hombres son las siguientes:  Diabetes.  Tratamiento Social worker, como la radioterapia o la quimioterapia.  El sndrome de [place]fel[place]. Este es un trastorno gentico hereditario.  Problemas de tiroides, como una tiroides hipoactiva o hiperactiva.  Fibrosis qustica.  Enfermedades de transmisin sexual.  Otros factores incluyen los siguientes:  La edad. La fertilidad en los hombres declina con la edad.  Beber alcohol en exceso.  Consumir drogas.  La exposicin a toxinas ambientales, como la radiacin, los plaguicidas y el plomo. CULES SON LOS SNTOMAS DE LA INFERTILIDAD? El nico signo de infertilidad es no poder Retail buyer despus de un ao de Theatre manager relaciones sexuales regularmente sin usar ningn mtodo de control de la natalidad. CMO SE DIAGNOSTICA LA INFERTILIDAD? Para recibir un diagnstico de infertilidad, ambos integrantes de la pareja se harn a un examen fsico. Tambin se analizar en detalle la historia clnica y sexual de ambos. Si no hay una razn evidente de infertilidad, se harn estudios adicionales. Qu estudios se harn las mujeres? Las mujeres primero pueden  hacerse estudios para controlar si ovulan todos los meses. Estos estudios pueden incluir lo siguiente:  Anlisis de sangre para [employer] niveles hormonales.  Una ecografa de los ovarios. Este estudio detecta posibles problemas en los ovarios.  Toma de [place] de tejido que recubre el tero para examinarla en el microscopio (biopsia de endometrio). Las mujeres que ovulan pueden realizarse estudios adicionales. Estos pueden incluir los siguientes:  Histerosalpingografa.  Es una radiografa de las trompas de [place] y el tero despus de Tour manager un tipo especfico de sustancia de [place].  Esta prueba puede mostrar la forma del tero y si las trompas de [place] estn abiertas.  Laparoscopia.  En [employer], se [place] un tubo que emite luz (laparoscopio) para Education officer, environmental en las trompas de [place] y otros rganos femeninos.  Ecografa transvaginal.  Este es un estudio de diagnstico por imgenes que determina si hay anomalas en el tero y los ovarios.  El mdico puede utilizar este estudio para contar la cantidad de [employer] ovarios.  Histeroscopa.  En [employer], se [place] un tubo que emite luz para examinar el cuello y el interior del tero.  Se realiza para Airline pilot en el interior del tero. Qu estudios se harn los hombres? Los estudios para Office manager infertilidad en los hombres incluyen los siguientes:  Anlisis de semen para controlar el recuento, la morfologa y la motilidad de los espermatozoides.  Anlisis de sangre para [employer].  Toma de [place] de tejido del interior de un testculo (biopsia).  La muestra se examina en el microscopio.  Anlisis de sangre para Airline pilot genticas (pruebas genticas). CUL ES EL TRATAMIENTO PARA LA INFERTILIDAD EN LAS MUJERES? El tratamiento depende de la causa de la infertilidad. En la [employer], la infertilidad en las  mujeres se trata con medicamentos o [place].  Las mujeres pueden tomar medicamentos para:  Biomedical scientist de ovulacin.  Tratar otras enfermedades, como el [place].  Se puede realizar Clementeen Hoof para lo siguiente:  Reparar daos en los ovarios, las trompas de [place], el cuello del tero o Nurse, learning disability.  Extraer tumores del tero.  Extraer tejido cicatricial del tero, la pelvis u otros rganos femeninos. CUL ES EL TRATAMIENTO PARA LA INFERTILIDAD EN LOS [place]? El tratamiento depende de la causa de la infertilidad. En la [employer], la infertilidad en los hombres se trata con medicamentos o [place].  Los hombres pueden tomar medicamentos para:  Occupational psychologist.  Tratar otras enfermedades.  Tratar la disfuncin sexual.  Se puede realizar Clementeen Hoof para lo siguiente:  Eliminar obstrucciones en el tracto reproductivo.  Corregir otros problemas estructurales del tracto reproductivo. QU SON LAS TCNICAS DE REPRODUCCIN ASISTIDA? Las tcnicas de reproduccin asistida (TRA) se refieren a todos los tratamientos y procedimientos que unen vulos y espermatozoides fuera del cuerpo para ayudar a una pareja a Counselling psychologist. Las TRA se suelen Oceanographer con medicamentos para la fertilidad que estimulan la ovulacin. En algunos casos, las TRA se llevan a cabo con vulos extrados del cuerpo de otra mujer (vulos de donante) o con vulos previamente fertilizados y congelados (embriones). Hay diferentes tipos de TRA. Estos incluyen los siguientes:  Inseminacin intrauterina (IIU).  En este procedimiento, los espermatozoides se colocan directamente en el tero de la mujer con un tubo largo y delgado.  Esta tcnica puede ser la ms efectiva para la infertilidad causada por problemas con los espermatozoides, incluidos el bajo recuento y la baja motilidad.  Se puede utilizar en combinacin con medicamentos para la fertilidad.  Fertilizacin in vitro (FIV).  Por lo  general, se [place] esta tcnica cuando las trompas de [place] de la mujer estn obstruidas o si el hombre tiene un bajo recuento de espermatozoides.  Los medicamentos para la infertilidad estimulan los ovarios para que produzcan varios vulos. Cuando estn maduros, estos vulos se extraen del cuerpo y se unen a los espermatozoides para ser fertilizados.  Luego los vulos fertilizados se colocan en el tero de la [place]. Esta informacin no tiene Marine scientist el consejo del mdico. Asegrese de hacerle al mdico cualquier pregunta que tenga. Document Released: 12/03/2007 Document Revised: 12/04/2014 Document Reviewed: 07/29/2014 Elsevier Interactive Patient Education  2017 [employer].

## 2017-01-26 ENCOUNTER — Other Ambulatory Visit (INDEPENDENT_AMBULATORY_CARE_PROVIDER_SITE_OTHER): Payer: Self-pay | Admitting: Physician Assistant

## 2017-01-26 DIAGNOSIS — E138 Other specified diabetes mellitus with unspecified complications: Secondary | ICD-10-CM

## 2017-01-26 LAB — TSH: TSH: 1.92 u[IU]/mL (ref 0.450–4.500)

## 2017-01-26 LAB — CBC WITH DIFFERENTIAL/PLATELET
BASOS ABS: 0.1 10*3/uL (ref 0.0–0.2)
Basos: 1 %
EOS (ABSOLUTE): 0.6 10*3/uL — ABNORMAL HIGH (ref 0.0–0.4)
Eos: 7 %
HEMATOCRIT: 42.5 % (ref 34.0–46.6)
HEMOGLOBIN: 14.6 g/dL (ref 11.1–15.9)
IMMATURE GRANS (ABS): 0 10*3/uL (ref 0.0–0.1)
Immature Granulocytes: 0 %
LYMPHS ABS: 2.8 10*3/uL (ref 0.7–3.1)
Lymphs: 31 %
MCH: 30 pg (ref 26.6–33.0)
MCHC: 34.4 g/dL (ref 31.5–35.7)
MCV: 87 fL (ref 79–97)
MONOCYTES: 7 %
Monocytes Absolute: 0.7 10*3/uL (ref 0.1–0.9)
NEUTROS ABS: 5 10*3/uL (ref 1.4–7.0)
Neutrophils: 54 %
Platelets: 255 10*3/uL (ref 150–379)
RBC: 4.86 x10E6/uL (ref 3.77–5.28)
RDW: 13.2 % (ref 12.3–15.4)
WBC: 9.1 10*3/uL (ref 3.4–10.8)

## 2017-01-26 LAB — COMPREHENSIVE METABOLIC PANEL
ALBUMIN: 4 g/dL (ref 3.5–5.5)
ALT: 38 IU/L — ABNORMAL HIGH (ref 0–32)
AST: 35 IU/L (ref 0–40)
Albumin/Globulin Ratio: 1.4 (ref 1.2–2.2)
Alkaline Phosphatase: 76 IU/L (ref 39–117)
BUN / CREAT RATIO: 21 (ref 9–23)
BUN: 12 mg/dL (ref 6–20)
Bilirubin Total: 0.2 mg/dL (ref 0.0–1.2)
CALCIUM: 9 mg/dL (ref 8.7–10.2)
CO2: 24 mmol/L (ref 18–29)
CREATININE: 0.56 mg/dL — AB (ref 0.57–1.00)
Chloride: 97 mmol/L (ref 96–106)
GFR, EST AFRICAN AMERICAN: 136 mL/min/{1.73_m2} (ref >59)
GFR, EST NON AFRICAN AMERICAN: 118 mL/min/{1.73_m2} (ref >59)
GLOBULIN, TOTAL: 2.8 g/dL (ref 1.5–4.5)
GLUCOSE: 209 mg/dL — AB (ref 65–99)
Potassium: 4.1 mmol/L (ref 3.5–5.2)
SODIUM: 137 mmol/L (ref 134–144)
TOTAL PROTEIN: 6.8 g/dL (ref 6.0–8.5)

## 2017-01-26 LAB — FSH/LH
FSH: 0.8 m[IU]/mL
LH: 0.6 m[IU]/mL

## 2017-01-26 MED ORDER — METFORMIN HCL 500 MG PO TABS: 500.0000 mg | ORAL_TABLET | Freq: Two times a day (BID) | ORAL | 3 refills | Status: DC

## 2017-01-26 NOTE — Progress Notes (Signed)
Advised to monitor LFTs. Next blood draw 3 months.

## 2017-01-30 ENCOUNTER — Ambulatory Visit (HOSPITAL_COMMUNITY)
Admission: RE | Admit: 2017-01-30 | Discharge: 2017-01-30 | Disposition: A | Discharge status: Home / Self Care | Payer: Self-pay | Source: Ambulatory Visit | Attending: Physician Assistant | Admitting: Physician Assistant

## 2017-01-30 DIAGNOSIS — R1031 Right lower quadrant pain: Secondary | ICD-10-CM | POA: Insufficient documentation

## 2017-02-05 ENCOUNTER — Other Ambulatory Visit (INDEPENDENT_AMBULATORY_CARE_PROVIDER_SITE_OTHER): Payer: Self-pay | Admitting: Physician Assistant

## 2017-02-08 ENCOUNTER — Encounter (INDEPENDENT_AMBULATORY_CARE_PROVIDER_SITE_OTHER): Payer: Self-pay | Admitting: Physician Assistant

## 2017-02-08 ENCOUNTER — Ambulatory Visit (INDEPENDENT_AMBULATORY_CARE_PROVIDER_SITE_OTHER): Payer: Self-pay | Admitting: Physician Assistant

## 2017-02-08 VITALS — BP 125/74 | HR 78 | Temp 98.0°F | Ht 60.0 in | Wt 162.0 lb

## 2017-02-08 DIAGNOSIS — E282 Polycystic ovarian syndrome: Secondary | ICD-10-CM

## 2017-02-08 DIAGNOSIS — E2839 Other primary ovarian failure: Secondary | ICD-10-CM

## 2017-02-08 DIAGNOSIS — O24119 Pre-existing diabetes mellitus, type 2, in pregnancy, unspecified trimester: Secondary | ICD-10-CM | POA: Insufficient documentation

## 2017-02-08 HISTORY — DX: Other primary ovarian failure: E28.39

## 2017-02-08 HISTORY — DX: Polycystic ovarian syndrome: E28.2

## 2017-02-08 NOTE — Progress Notes (Signed)
Subjective:  Patient ID: Chloe Perez, female    DOB: August 08, 1976  Age: 41 y.o. MRN: [id number]  CC: f/u labs, infertility.   HPI Chloe Perez is a 42 y.o. gravida 1 para 0 AB1 female with a PMH of infertility, PCOS, resectoscopic polypectomy of uterus, and left tubal occlusion that presents for f/u of infertility. She had previously received treatment for infertility in her country but does not know her diagnosis. Said she received hormonal therapy at the time but is unsure of the treatment name. She also tried Clomiphene in 2014 and intrauterine insemination which ultimately failed. She continues with hope to have a child but feels that she is now approaching too old of an age for child bearing. Oligomenorrhea persists. FSH and LH were 0.8 and 0.6 mIU/mL respectively, tested 01/25/17. Denies headache, chest pain, SOB, abdominal pain, rash, f/c/n/v, or GI/GU sxs.   ROS Review of Systems  Constitutional: Negative for chills, fever and malaise/fatigue.  Eyes: Negative for blurred vision.  Respiratory: Negative for shortness of breath.   Cardiovascular: Negative for chest pain and palpitations.  Gastrointestinal: Negative for abdominal pain and nausea.  Genitourinary: Negative for dysuria and hematuria.  Musculoskeletal: Negative for joint pain and myalgias.  Skin: Negative for rash.  Neurological: Negative for tingling and headaches.  Psychiatric/Behavioral: Negative for depression. The patient is not nervous/anxious.     Objective:  BP 125/74 (BP Location: Left Arm, Patient Position: Sitting, Cuff Size: Normal)   Pulse 78   Temp 98 F (36.7 C) (Oral)   Ht 5' (1.524 m)   Wt 162 lb (73.5 kg)   LMP 01/25/2017 (Exact Date)   SpO2 98%   BMI 31.64 kg/m   BP/Weight 02/08/2017 0/11/270 03/30/6643  Systolic BP 034 742 595  Diastolic BP 74 75 77  Wt. (Lbs) 162 160.2 -  BMI 31.64 31.29 -      Physical Exam  Constitutional: She is oriented to person, place, and time.   Well developed, well nourished, NAD, polite  HENT:  Head: Normocephalic and atraumatic.  Eyes: No scleral icterus.  Neck: Normal range of motion. Neck supple. No thyromegaly present.  Cardiovascular: Normal rate, regular rhythm and normal heart sounds.   Pulmonary/Chest: Effort normal and breath sounds normal.  Abdominal: Soft. Bowel sounds are normal. There is no tenderness.  Musculoskeletal: She exhibits no edema.  Neurological: She is alert and oriented to person, place, and time.  Skin: Skin is warm and dry. No rash noted. No erythema. No pallor.  Psychiatric: She has a normal mood and affect. Her behavior is normal. Thought content normal.  Vitals reviewed.    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 Advise endocrinology/infertility clinic referral for further workup that may include MRI brain, further labs e.g. Prolactin,.and/or HRT. Unfortunately, finance is a main concern with patient and I defer to infertility specialist to help guide the most cost effective workup and treatment for this patient. She is still in the process of applying for [employer]. She is 41 years old and feels there is little time to waste.  - Scheduled with [facility] on April 17th at 0900 hrs. FSH 0.8 LH 0.6 TSH normal US pelvic and transvaginal normal   2. PCOS - Maintain Metformin 500mg  BID for now. Will consider TID dosing but will wait for infertility consult.  Follow-up: Return if symptoms worsen or fail to  improve, for infertility. F/u as scheduled for DM.   Clent Demark PA

## 2017-02-08 NOTE — Patient Instructions (Signed)
Please report to your appointment with an infertility specialist on April 17th at 0900 hrs located at [facility]

## 2017-04-11 ENCOUNTER — Encounter: Payer: Self-pay | Admitting: Gynecology

## 2017-04-20 ENCOUNTER — Ambulatory Visit (INDEPENDENT_AMBULATORY_CARE_PROVIDER_SITE_OTHER): Payer: Self-pay | Admitting: Physician Assistant

## 2017-04-20 ENCOUNTER — Encounter (INDEPENDENT_AMBULATORY_CARE_PROVIDER_SITE_OTHER): Payer: Self-pay | Admitting: Physician Assistant

## 2017-04-20 VITALS — BP 115/73 | HR 70 | Temp 97.6°F | Wt 155.2 lb

## 2017-04-20 DIAGNOSIS — L723 Sebaceous cyst: Secondary | ICD-10-CM

## 2017-04-20 NOTE — Patient Instructions (Signed)
Quiste epidrmico (Epidermal Cyst) Un quiste epidrmico es un bulto pequeo, no doloroso, que se encuentra debajo de la piel. Tambin se lo puede llamar quiste de inclusin epidrmica o quiste infundibular. El quiste contiene una sustancia blanca griscea con mal olor [place]). Es importante que no apriete los quistes epidrmicos. Estos quistes por lo general no son dainos (son benignos), pero se pueden infectar. Entre los sntomas de infeccin, se incluyen los siguientes:  Enrojecimiento.  Inflamacin.  Dolor a Secretary/administrator.  Calor.  Cristy Hilts.  Una sustancia blanca griscea, con mal olor, que sale del [place].  Pus que sale del quiste. Posen los medicamentos de venta libre y los recetados solamente como se lo haya indicado el mdico.  Si le recetaron un antibitico, tmelo como se lo haya indicado el mdico. No deje de usar el antibitico aunque comience a Sports administrator.  Mantenga el rea que rodea el quiste limpia y [place].  Use ropa suelta y seca.  No intente apretar el quiste.  Evite tocar el quiste.  Revise el [employer] para detectar signos de infeccin.  Concurra a todas las visitas de control como se lo haya indicado el mdico. Esto es importante. PREVENCIN  Use ropa limpia y [place].  Evite usar ropa [place].  Mantenga la piel limpia y seca. Tome una ducha o un bao [employer].  Lvese el cuerpo con perxido de benzolo cuando tome una ducha o un bao. SOLICITE AYUDA SI:  El quiste presenta sntomas de infeccin.  El problema no mejora, o empeora.  Tiene un quiste con un aspecto diferente de los otros quistes que ha tenido.  Tiene fiebre. SOLICITE AYUDA DE INMEDIATO SI:  Aparece enrojecimiento en el quiste que se propaga hacia el rea que lo rodea. Esta informacin no tiene Marine scientist el consejo del mdico. Asegrese de hacerle al mdico cualquier pregunta que tenga. Document Released: 11/13/2005 Document  Revised: 05/14/2012 Document Reviewed: 09/15/2015 Elsevier Interactive Patient Education  2017 [employer].

## 2017-04-20 NOTE — Progress Notes (Signed)
  Subjective:  Patient ID: Chloe Perez, female    DOB: [birth year]  Age: 41 y.o. MRN: [id number]  CC: bump on left forearm  HPI Katherine Tout is a 41 y.o. female with no pertinent  PMH presents with a growing "bump" on left forearm. Lesion is not painful but can get pruritic at times. Would like excision. No other complaints or symptoms.    ROS Review of Systems  Constitutional: Negative for chills, fever and malaise/fatigue.  Eyes: Negative for blurred vision.  Respiratory: Negative for shortness of breath.   Cardiovascular: Negative for chest pain and palpitations.  Gastrointestinal: Negative for abdominal pain and nausea.  Genitourinary: Negative for dysuria and hematuria.  Musculoskeletal: Negative for joint pain and myalgias.  Skin: Negative for rash.       Skin lesion on left forearm  Neurological: Negative for tingling and headaches.  Psychiatric/Behavioral: Negative for depression. The patient is not nervous/anxious.     Objective:  BP 115/73 (BP Location: Left Arm, Patient Position: Sitting, Cuff Size: Normal)   Pulse 70   Temp 97.6 F (36.4 C) (Oral)   Wt 155 lb 3.2 oz (70.4 kg)   LMP 04/06/2017 (Exact Date)   SpO2 96%   BMI 30.31 kg/m   BP/Weight 04/20/2017 1/44/8185 04/29/1496  Systolic BP 026 378 588  Diastolic BP 73 74 75  Wt. (Lbs) 155.2 162 160.2  BMI 30.31 31.64 31.29      Physical Exam  Constitutional: She is oriented to person, place, and time.  Well developed, overweight, NAD, polite  HENT:  Head: Normocephalic and atraumatic.  Cardiovascular: Normal rate, regular rhythm and normal heart sounds.   Pulmonary/Chest: Effort normal and breath sounds normal.  Musculoskeletal: She exhibits no edema.  Neurological: She is alert and oriented to person, place, and time.  Skin: Skin is warm and dry.  Moderately large papule on left forearm. No bleeding, suppuration, cellulitis, or induration.  Psychiatric: She has a normal mood and  affect. Her behavior is normal. Thought content normal.  Vitals reviewed.    Assessment & Plan:   1. Sebaceous cyst - No suturing material in clinic. I have asked for an order to be placed for sutures. Expected time of arrival in two weeks. We will call patient when supplies for her procedure arrive.    Follow-up: Return in about 2 weeks (around 05/04/2017) for return for incision and drainage.   Clent Demark PA

## 2017-05-01 ENCOUNTER — Ambulatory Visit (INDEPENDENT_AMBULATORY_CARE_PROVIDER_SITE_OTHER): Payer: Self-pay | Admitting: Physician Assistant

## 2017-05-01 ENCOUNTER — Encounter (INDEPENDENT_AMBULATORY_CARE_PROVIDER_SITE_OTHER): Payer: Self-pay | Admitting: Physician Assistant

## 2017-05-01 VITALS — BP 131/89 | HR 69 | Temp 98.0°F | Wt 152.8 lb

## 2017-05-01 DIAGNOSIS — L723 Sebaceous cyst: Secondary | ICD-10-CM

## 2017-05-01 NOTE — Progress Notes (Signed)
Pt returned today to have sebaceous cyst on left dorsal forearm removed. She returned in two weeks as instructed but CMA stated there was a problem ordering supplies. Pt should not be charged for this visit as this was a failure on our part to obtain supplies in a timely manner.

## 2017-05-15 ENCOUNTER — Encounter: Payer: Self-pay | Admitting: Skilled Nursing Facility1

## 2017-05-15 ENCOUNTER — Encounter: Payer: Self-pay | Attending: Obstetrics | Admitting: Skilled Nursing Facility1

## 2017-05-15 DIAGNOSIS — E118 Type 2 diabetes mellitus with unspecified complications: Secondary | ICD-10-CM

## 2017-05-15 DIAGNOSIS — O24119 Pre-existing diabetes mellitus, type 2, in pregnancy, unspecified trimester: Secondary | ICD-10-CM | POA: Insufficient documentation

## 2017-05-15 DIAGNOSIS — Z712 Person consulting for explanation of examination or test findings: Secondary | ICD-10-CM | POA: Insufficient documentation

## 2017-05-15 DIAGNOSIS — Z3A00 Weeks of gestation of pregnancy not specified: Secondary | ICD-10-CM | POA: Insufficient documentation

## 2017-05-15 NOTE — Progress Notes (Signed)
Diabetes Self-Management Education  Visit Type: First/Initial  Appt. Start Time: 10:00Appt. End Time: 11:30  05/15/2017  [name], identified by name and date of birth, is a 41 y.o. female with a diagnosis of Diabetes: Type 2.   ASSESSMENT  Height 5\' 1"  (1.549 m), weight 151 lb 14.4 oz (68.9 kg). Body mass index is 28.7 kg/m. 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   Pt states she Tried invitro in January but not doing currently due to uncontrolled diabetes and that failed attempt.  Pt will come back in 2 days for follow up to discuss glucose monitoring.      Diabetes Self-Management Education - 05/15/17 1013      Visit Information   Visit Type First/Initial     Initial Visit   Diabetes Type Type 2   Are you currently following a meal plan? No   Are you taking your medications as prescribed? Yes   Date Diagnosed 5 years ago     Health Coping   How would you rate your overall health? Good     Psychosocial Assessment   Patient Belief/Attitude about Diabetes Defeat/Burnout   Self-care barriers English as a second language   Self-management support Family   Patient Concerns Nutrition/Meal planning   Special Needs None   Preferred Learning Style Visual   Learning Readiness Ready     Pre-Education Assessment   Patient understands the diabetes disease and treatment process. Needs Instruction   Patient understands incorporating nutritional management into lifestyle. Needs Instruction   Patient undertands incorporating physical activity into lifestyle. Needs Instruction   Patient understands using medications safely. Needs Instruction   Patient understands monitoring blood glucose, interpreting and using results Needs Instruction   Patient understands prevention, detection, and treatment of acute complications. Needs Instruction   Patient understands prevention, detection,  and treatment of chronic complications. Needs Instruction   Patient understands how to develop strategies to address psychosocial issues. Needs Instruction   Patient understands how to develop strategies to promote health/change behavior. Needs Instruction     Complications   Last HgB A1C per patient/outside source 9.9 %   How often do you check your blood sugar? 0 times/day (not testing)   Have you had a dilated eye exam in the past 12 months? No   Have you had a dental exam in the past 12 months? No   Are you checking your feet? No     Dietary Intake   Breakfast bread-----rice  with chicken   Lunch tortillas rice beans meat---stew---sandwich---skipped   Snack (afternoon) chips   Dinner tacos   Beverage(s) coffee,      Exercise   Exercise Type ADL's     Patient Education   Previous Diabetes Education No   Disease state  Factors that contribute to the development of diabetes   Nutrition management  Role of diet in the treatment of diabetes and the relationship between the three main macronutrients and blood glucose level;Food label reading, portion sizes and measuring food.;Carbohydrate counting;Meal options for control of blood glucose level and chronic complications.   Physical activity and exercise  Role of exercise on diabetes management, blood pressure control and cardiac health.   Monitoring Purpose and frequency of SMBG.;Yearly dilated eye exam;Daily foot exams   Acute complications Taught treatment of hypoglycemia - the 15 rule.   Chronic complications Retinopathy and reason for yearly dilated eye exams  Psychosocial adjustment Role of stress on diabetes   Preconception care Pregnancy and GDM  Role of pre-pregnancy blood glucose control on the development of the fetus;Role of family planning for patients with diabetes     Individualized Goals (developed by patient)   Nutrition Follow meal plan discussed;Adjust meds/carbs with exercise as discussed     Post-Education  Assessment   Patient understands the diabetes disease and treatment process. Demonstrates understanding / competency   Patient understands incorporating nutritional management into lifestyle. Demonstrates understanding / competency   Patient undertands incorporating physical activity into lifestyle. Demonstrates understanding / competency   Patient understands using medications safely. Demonstrates understanding / competency   Patient understands monitoring blood glucose, interpreting and using results Demonstrates understanding / competency   Patient understands prevention, detection, and treatment of acute complications. Demonstrates understanding / competency   Patient understands prevention, detection, and treatment of chronic complications. Demonstrates understanding / competency   Patient understands how to develop strategies to address psychosocial issues. Demonstrates understanding / competency   Patient understands how to develop strategies to promote health/change behavior. Demonstrates understanding / competency     Outcomes   Expected Outcomes Demonstrated interest in learning. Expect positive outcomes   Future DMSE 2 wks   Program Status Completed      Individualized Plan for Diabetes Self-Management Training:   Learning Objective:  Patient will have a greater understanding of diabetes self-management. Patient education plan is to attend individual and/or group sessions per assessed needs and concerns.   Plan:   There are no Patient Instructions on file for this visit.  Expected Outcomes:  Demonstrated interest in learning. Expect positive outcomes  Education material provided: Living Well with Diabetes and Meal plan card  If problems or questions, patient to contact team via:  Phone  Future DSME appointment: 2 wks

## 2017-05-17 ENCOUNTER — Encounter: Payer: Self-pay | Admitting: Skilled Nursing Facility1

## 2017-05-17 DIAGNOSIS — E119 Type 2 diabetes mellitus without complications: Secondary | ICD-10-CM

## 2017-05-17 NOTE — Progress Notes (Signed)
Wyatt Portela: [id number] was the interpretor used.   Meter: Accu check guide lot: [id number] exp: 02/14/18 was given  Dietitian educated the pt on the use of a glucose monitoring system and reiterated carbohydrate counting.   Pt is currently using a protein powder with 21g of protein and 11 grams of carbohydrate. Pt states she Does not drink water. Pt checked her blood sugar during the appointment and got 255. Pt was educated on this number. Pt arrived eating mostly only vegetables and having a raw egg in her smoothie in the morning. Pt has a hard time with comprehension but the pt has made many changes and is clearly taking her diabetes management seriously. Pt state she does not want to call her doctor and ask for a prescription for her meter but would not elaborate on why. Pt states she would rather buy the meter system from [place] than to call her doctor.  Pt is coming in for another follow to ensure she is understanding carbohydrate counting and checking her blood sugar Diabetes Self-Management Education  Visit Type: Follow-up   05/17/2017  [name], identified by name and date of birth, is a 41 y.o. female with a diagnosis of Diabetes: Type 2.   ASSESSMENT  There were no vitals taken for this visit. There is no height or weight on file to calculate BMI.      Diabetes Self-Management Education - 05/17/17 0850      Visit Information   Visit Type Follow-up     Initial Visit   Diabetes Type Type 2     Health Coping   How would you rate your overall health? Good     Dietary Intake   Breakfast shake: vegeatbles and egg: spinach beets carrots and milk with a protein powder and apple and pear    Lunch vegatbles with chicken   Dinner vegetables    Beverage(s) protein powder      Individualized Plan for Diabetes Self-Management Training:   Learning Objective:  Patient will have a greater understanding of diabetes self-management. Patient education plan is to attend  individual and/or group sessions per assessed needs and concerns.   Plan:   There are no Patient Instructions on file for this visit.  If problems or questions, patient to contact team via:  Phone  Future DSME appointment:

## 2017-05-29 ENCOUNTER — Encounter: Payer: Self-pay | Admitting: Skilled Nursing Facility1

## 2017-05-29 ENCOUNTER — Encounter: Payer: Self-pay | Attending: Obstetrics | Admitting: Skilled Nursing Facility1

## 2017-05-29 DIAGNOSIS — Z3A00 Weeks of gestation of pregnancy not specified: Secondary | ICD-10-CM | POA: Insufficient documentation

## 2017-05-29 DIAGNOSIS — E669 Obesity, unspecified: Secondary | ICD-10-CM

## 2017-05-29 DIAGNOSIS — O24119 Pre-existing diabetes mellitus, type 2, in pregnancy, unspecified trimester: Secondary | ICD-10-CM | POA: Insufficient documentation

## 2017-05-29 DIAGNOSIS — Z712 Person consulting for explanation of examination or test findings: Secondary | ICD-10-CM | POA: Insufficient documentation

## 2017-05-29 NOTE — Progress Notes (Signed)
  Interpretor used: [employer] [phone]  Pts numbers she has been getting: 189, 222, 289, 185  Pt states she ate 2 tortillas, egg and chorizo, 2 slices of white bread thick slices and coffee milk and splenda, also a smoothie with vegetables, pineapple, milk. Pt took her blood sugar during the appointment and got 324 which was 2 hours after that breakfast.  Dietitian taught pt how to use her new meter. Pt states she does not like the new meter she got so she will go to her doctor for a prescription for the one that was given to her.  Pt was told to check her blood sugars 2 times a day one fasting and one postprandial.  Pt continues to struggle with comprehension but she gets better each time she comes.   Diabetes Self-Management Education  Visit Type:     05/29/2017  [name], identified by name and date of birth, is a 41 y.o. female with a diagnosis of Diabetes:  .   Individualized Plan for Diabetes Self-Management Training:   Learning Objective:  Patient will have a greater understanding of diabetes self-management. Patient education plan is to attend individual and/or group sessions per assessed needs and concerns.

## 2017-06-07 ENCOUNTER — Encounter (INDEPENDENT_AMBULATORY_CARE_PROVIDER_SITE_OTHER): Payer: Self-pay | Admitting: Physician Assistant

## 2017-06-07 ENCOUNTER — Ambulatory Visit (INDEPENDENT_AMBULATORY_CARE_PROVIDER_SITE_OTHER): Payer: Self-pay | Admitting: Physician Assistant

## 2017-06-07 VITALS — BP 101/64 | HR 75 | Temp 98.0°F | Wt 153.0 lb

## 2017-06-07 DIAGNOSIS — E118 Type 2 diabetes mellitus with unspecified complications: Secondary | ICD-10-CM

## 2017-06-07 LAB — POCT GLYCOSYLATED HEMOGLOBIN (HGB A1C): Hemoglobin A1C: 9.8

## 2017-06-07 MED ORDER — GLUCOSE BLOOD VI STRP: ORAL_STRIP | 12 refills | Status: DC

## 2017-06-07 MED ORDER — ACCU-CHEK SOFT TOUCH LANCETS MISC: 12 refills | Status: DC

## 2017-06-07 MED ORDER — METFORMIN HCL 1000 MG PO TABS: 1000.0000 mg | ORAL_TABLET | Freq: Two times a day (BID) | ORAL | 3 refills | Status: DC

## 2017-06-07 MED ORDER — GLIMEPIRIDE 4 MG PO TABS: 4.0000 mg | ORAL_TABLET | Freq: Every day | ORAL | 3 refills | Status: DC

## 2017-06-07 NOTE — Progress Notes (Signed)
Subjective:  Patient ID: Chloe Perez, female    DOB: December 23, 1975  Age: 41 y.o. MRN: [id number]  CC: f/u DM  HPI Brynli Ollis is a 41 y.o. female with a PMH of DM2 presents on f/u of DM2. Requests strips for her Accu-check glucometer. Reports taking her Metformin 500 mg BID but not her glimepiride 4 mg. Says she did not know she had to take both of the medications. Has been trying to eat healthier by eating much more vegetables and avoiding white rice and tortillas. A1c 8.5% three months ago. A1c 9.8% today. Endorses polydipsia, polyuria, polyphagia, occasional visual blurring, tingling/numbness of hands. However, she cuts hair for a living and does not know if this is the cause of her tingling/numbess of the hands. Does not endorse any other symptoms.      Outpatient Medications Prior to Visit  Medication Sig Dispense Refill  . metFORMIN (GLUCOPHAGE) 500 MG tablet Take 1 tablet (500 mg total) by mouth 2 (two) times daily with a meal. 180 tablet 3   No facility-administered medications prior to visit.      ROS Review of Systems  Constitutional: Negative for chills, fever and malaise/fatigue.  Eyes: Positive for blurred vision.  Respiratory: Negative for shortness of breath.   Cardiovascular: Negative for chest pain and palpitations.  Gastrointestinal: Negative for abdominal pain and nausea.  Genitourinary: Negative for dysuria and hematuria.  Musculoskeletal: Negative for joint pain and myalgias.  Skin: Negative for rash.  Neurological: Positive for tingling. Negative for headaches.  Endo/Heme/Allergies: Positive for polydipsia.  Psychiatric/Behavioral: Negative for depression. The patient is not nervous/anxious.     Objective:  BP 101/64 (BP Location: Left Arm, Patient Position: Sitting, Cuff Size: Normal)   Pulse 75   Temp 98 F (36.7 C) (Oral)   Wt 153 lb (69.4 kg)   LMP 05/23/2017 (Approximate)   SpO2 98%   BMI 29.88 kg/m   BP/Weight 06/07/2017  05/29/2017 4/81/8563  Systolic BP 149 - -  Diastolic BP 64 - -  Wt. (Lbs) 153 - 151.9  BMI 29.88 - 28.7      Physical Exam  Constitutional: She is oriented to person, place, and time.  NAD, obese, polite  HENT:  Head: Normocephalic and atraumatic.  Eyes: Conjunctivae are normal.  Neck: Normal range of motion. Thyromegaly: difficult to assess due to body habitus.  Difficult to assess for thyromegaly or nodules due to body habitus  Cardiovascular: Normal rate, regular rhythm and normal heart sounds.   Lower extremity distal pulses difficult to assess due to body habitus.  Pulmonary/Chest: Effort normal and breath sounds normal.  Abdominal: Soft. Bowel sounds are normal. There is no tenderness.  Difficult to assess for abdominal mass due to body habitus   Musculoskeletal: She exhibits no edema, tenderness or deformity.  Bilateral non-pitting edema of the lower extremity  Neurological: She is alert and oriented to person, place, and time. No cranial nerve deficit. Coordination normal.  Negative Tinel's and Phalen's test bilaterally.  Skin: Skin is warm and dry.  Subcutaneous cyst on left forearm  Psychiatric: She has a normal mood and affect.  Vitals reviewed.    Assessment & Plan:   1. Type 2 diabetes mellitus with complication, without long-term current use of insulin (HCC) - HgB A1c 9.8% up from previous 8.4% - Increase metFORMIN (GLUCOPHAGE) 1000 MG tablet; Take 1 tablet (1,000 mg total) by mouth 2 (two) times daily with a meal.  Dispense: 180 tablet; Refill: 3 - Take glimepiride (AMARYL) 4 MG tablet;  Take 1 tablet (4 mg total) by mouth daily before breakfast.  Dispense: 30 tablet; Refill: 3 - Refill glucose blood (ACCU-CHEK GUIDE) test strip; Use as instructed  Dispense: 100 each; Refill: 12 - Refill Lancets (ACCU-CHEK SOFT TOUCH) lancets; Use as instructed  Dispense: 100 each; Refill: 12   Meds ordered this encounter  Medications  . metFORMIN (GLUCOPHAGE) 1000 MG tablet     Sig: Take 1 tablet (1,000 mg total) by mouth 2 (two) times daily with a meal.    Dispense:  180 tablet    Refill:  3    Order Specific Question:   Supervising Provider    Answer:   Tresa Garter [id number]  . glimepiride (AMARYL) 4 MG tablet    Sig: Take 1 tablet (4 mg total) by mouth daily before breakfast.    Dispense:  30 tablet    Refill:  3    Order Specific Question:   Supervising Provider    Answer:   Tresa Garter [id number]  . glucose blood (ACCU-CHEK GUIDE) test strip    Sig: Use as instructed    Dispense:  100 each    Refill:  12    Order Specific Question:   Supervising Provider    Answer:   Tresa Garter [[id number]]  . Lancets (ACCU-CHEK SOFT TOUCH) lancets    Sig: Use as instructed    Dispense:  100 each    Refill:  12    Order Specific Question:   Supervising Provider    Answer:   Tresa Garter [[id number]]    Follow-up: Return in about 3 months (around 09/07/2017) for diabetes.   Clent Demark PA

## 2017-06-07 NOTE — Patient Instructions (Signed)
Diabetes mellitus y [place] fsica (Diabetes Mellitus and Exercise) Hacer actividad fsica habitualmente es importante para el estado de salud general, en especial si tiene diabetes (diabetes mellitus). La actividad fsica no solo se reduce a Pharmacist, hospital. Aporta muchos beneficios para la salud, como aumentar la fuerza muscular y la densidad sea, y reducir las grasas corporales y Dealer. Esto mejora el estado fsico, la flexibilidad y la resistencia, y todo ello redunda en un mejor estado de salud general. La actividad fsica tiene beneficios adicionales para los diabticos, entre ellos:  Disminuye el apetito.  Ayuda a bajar y Consulting civil engineer glucemia bajo control.  Baja la presin arterial.  Ayuda a controlar las cantidades de sustancias grasas (lpidos) en la [place], como el colesterol y los triglicridos.  Mejora la respuesta del cuerpo a la insulina (optimizacin de la sensibilidad a la insulina).  Reduce la cantidad de insulina que el cuerpo necesita.  Reduce el riesgo de sufrir cardiopata coronaria de la siguiente forma: ? [employer] de colesterol y triglicridos. ? Aumenta los niveles de colesterol bueno. ? Disminuye la glucemia. QU ES EL PLAN DE ACTIVIDADES? El mdico o un educador para la diabetes certificado pueden ayudarlo a Engineer, petroleum del tipo y de la frecuencia de actividad fsica (plan de actividades) adecuado para usted. Asegrese de lo siguiente:  Haga por lo menos 162mnutos semanales de ejercicios de intensidad moderada o vigorosa. Estos podran ser caminatas dinmicas, ciclismo o gBenin ? Haga ejercicios de elongacin y de fortalecimiento, como yoga o levantamiento de pesas, por lo menos 2veces por semana. ? Reparta la actividad en al menos 3das de la semana.  Haga algn tipo de actividad fsica t[employer] ? No deje pasar ms de 2das seguidos sin hacer algn tipo de actividad fsica. ? No permanezca inactivo durante  ms de 916mutos seguidos. Tmese descansos frecuentes para caminar o estirarse.  Elija un tipo de ejercicio o de actividad que disfrute y establezca objetivos realistas.  Comience lentamente y aumente de maMozambiqueradual la intensidad del ejercicio con el correr del tiShenandoahQU DEBO SABER ACERCA DEL CONTROL DE LA DIABETES?  Contrlese la glucemia antes y despus de ejercitarse. ? Si la glucemia supera los 24048ml (13,3mm84ml) antes de ejercitarse, hgase un control de la orina para detectar la presencia de cetonas. Si tiene cetoEmerson Electricna, no se ejercite hasta que la glucemia se normalice.  Conozca los sntomas de la glucemia baja (hipoglucemia) y aprenda cmo tratarla. El riesgo de tener hipoglucemia aumeSerbiaante y despus de hacer actividad fsica. Los sntomas frecuentes de hipoglucemia pueden incluir los siguientes: ? Hambre. ? Ansiedad. ? Sudoracin y pielIntel CorporationConfusin. ? Mareos o sensacin de desvanecimiento. ? Aumento de la frecuencia cardaca o palpitaciones. ? Visin borrosa. ? Hormigueo o adormecimiento alrededor de lExxon Mobil Corporations labios o la lengPerkinsTemblores o sacudidas. ? Irritabilidad.  Tenga una colacin de carbohidratos de accin rpida disponible antes, durante y despus de ejercitarse, a fin de evitar o tratar la hipoglucemia.  Evite inyectarse insulina en las zonas del cuerpo que ejercitar. Por ejemplo, evite inyectarse insulina en: ? Los brazos, si juega al tenis. ? Las piernas, si corre.  Lleve registros de sus hbitos de actividad fsica. Esto puede ayudarlos a usted y al mdico a adapTax advisercontrol de la diabetes segn sea necesario. Escriba los siguientes datos: ? Los alimentos que consume antes y despus de haceField seismologistividad fsica. ? Los niveles de glucosa en  Esto puede ayudarlos a usted y al mdico a adaptar el plan de control de la diabetes segn sea necesario. Escriba los siguientes datos:  ? Los alimentos que consume antes y despus de hacer actividad fsica.  ? Los niveles de glucosa en la sangre antes y despus de hacer ejercicios.  ? El tipo y cantidad de actividad fsica que realiza.  ? Cuando se prev que la insulina alcance su valor mximo, si usa insulina.  No haga actividad fsica en los momentos en que insulina alcanza su valor mximo.   Cuando comience un ejercicio o una actividad nuevos, trabaje con el mdico para asegurarse de que la actividad sea segura para usted y para ajustar la insulina, los medicamentos o la ingesta de alimentos segn sea necesario.   Beba gran cantidad de agua mientras hace ejercicios para evitar la deshidratacin o los golpes de calor. Beba suficiente lquido para mantener la orina clara o de color amarillo plido.  Esta informacin no tiene como fin reemplazar el consejo del mdico. Asegrese de hacerle al mdico cualquier pregunta que tenga.  Document Released: 12/03/2007 Document Revised: 03/06/2016 Document Reviewed: 04/24/2016  Elsevier Interactive Patient Education  2018 Elsevier Inc.

## 2017-07-10 ENCOUNTER — Encounter: Payer: Self-pay | Attending: Obstetrics | Admitting: Skilled Nursing Facility1

## 2017-07-10 DIAGNOSIS — Z712 Person consulting for explanation of examination or test findings: Secondary | ICD-10-CM | POA: Insufficient documentation

## 2017-07-10 DIAGNOSIS — E118 Type 2 diabetes mellitus with unspecified complications: Secondary | ICD-10-CM

## 2017-07-10 DIAGNOSIS — Z3A00 Weeks of gestation of pregnancy not specified: Secondary | ICD-10-CM | POA: Insufficient documentation

## 2017-07-10 DIAGNOSIS — O24119 Pre-existing diabetes mellitus, type 2, in pregnancy, unspecified trimester: Secondary | ICD-10-CM | POA: Insufficient documentation

## 2017-07-10 NOTE — Progress Notes (Signed)
  Interpretor used: [employer] [phone]  Pts numbers she has been getting: 120-140 checking every other day due to fear of pricking herself  Pt states she is Happy because her blood sugars are under control and she is going to the gym.Pt states she has been going to the gym for 2 weeks 5 days a week 60 minutes: elliptical walking. Pt does understand hypoglycemia signs/symptoms and procedures showed through teach back . Pt states she eats every 2-3 hours. Pt states she has Good energy. Blood sugars: 120-140 2 hours postprandial. Pt states she drinks 5-6 bottles of water a day. Pt states she sees her doctor in a couple days and sees her fertility doctor in 2 months.   Dietary Recall: Pureed vegetables and sandwich ham and egg and cheese and mayo  Watermelon (educatd to have protein with it) Chicken salad  vegetable soup and broccoli and celery  Sandwich ham and egg and cheese  and coffee and milk  Diabetes Self-Management Education  Visit Type:     07/10/2017  Chloe Perez, identified by name and date of birth, is a 41 y.o. female with a diagnosis of Diabetes:  .   Individualized Plan for Diabetes Self-Management Training:   Learning Objective:  Patient will have a greater understanding of diabetes self-management. Patient education plan is to attend individual and/or group sessions per assessed needs and concerns.

## 2017-07-16 ENCOUNTER — Ambulatory Visit (INDEPENDENT_AMBULATORY_CARE_PROVIDER_SITE_OTHER): Payer: Self-pay | Admitting: Physician Assistant

## 2017-07-16 ENCOUNTER — Encounter (INDEPENDENT_AMBULATORY_CARE_PROVIDER_SITE_OTHER): Payer: Self-pay | Admitting: Physician Assistant

## 2017-07-16 VITALS — BP 111/72 | HR 66 | Temp 98.2°F | Resp 18 | Ht 62.0 in | Wt 153.0 lb

## 2017-07-16 DIAGNOSIS — L723 Sebaceous cyst: Secondary | ICD-10-CM

## 2017-07-16 MED ORDER — CEPHALEXIN 500 MG PO TABS: 500.0000 mg | ORAL_TABLET | Freq: Three times a day (TID) | ORAL | 0 refills | Status: AC

## 2017-07-16 NOTE — Progress Notes (Signed)
   Subjective:  Patient ID: Chloe Perez, female    DOB: [birth year]  Age: 41 y.o. MRN: [id number]  CC: sebaceous cyst  HPI Chloe Perez is a 41 y.o. female with a medical history of  DM2 presents to have the sebaceous cyst on the left forearm removed. Had initially been diagnosed on 04/20/17 but did not have I&D. Was waiting on [place] to have sutures ordered. The nodule persists and is only tender to palpation. Does not endorse cellulitis, suppuration, bleeding, f/c/n/v, or increased warmth.       Outpatient Medications Prior to Visit  Medication Sig Dispense Refill  . glimepiride (AMARYL) 4 MG tablet Take 1 tablet (4 mg total) by mouth daily before breakfast. 30 tablet 3  . glucose blood (ACCU-CHEK GUIDE) test strip Use as instructed 100 each 12  . Lancets (ACCU-CHEK SOFT TOUCH) lancets Use as instructed 100 each 12  . metFORMIN (GLUCOPHAGE) 1000 MG tablet Take 1 tablet (1,000 mg total) by mouth 2 (two) times daily with a meal. 180 tablet 3   No facility-administered medications prior to visit.      ROS Review of Systems  Constitutional: Negative for chills, fever and malaise/fatigue.  Eyes: Negative for blurred vision.  Respiratory: Negative for shortness of breath.   Cardiovascular: Negative for chest pain and palpitations.  Gastrointestinal: Negative for abdominal pain and nausea.  Genitourinary: Negative for dysuria and hematuria.  Musculoskeletal: Negative for joint pain and myalgias.  Skin: Negative for rash.       Lesion on left forearm  Neurological: Negative for tingling and headaches.  Psychiatric/Behavioral: Negative for depression. The patient is not nervous/anxious.     Objective:  BP 111/72 (BP Location: Right Arm, Patient Position: Sitting, Cuff Size: Normal)   Pulse 66   Temp 98.2 F (36.8 C) (Oral)   Resp 18   Ht 5\' 2"  (1.575 m)   Wt 153 lb (69.4 kg)   SpO2 97%   BMI 27.98 kg/m   BP/Weight 07/16/2017 07/10/2017 07/28/5175  Systolic BP  160 - 737  Diastolic BP 72 - 64  Wt. (Lbs) 153 - 153  BMI 27.98 - 29.88      Physical Exam  Constitutional:  Well developed, well nourished, NAD, polite  HENT:  Head: Normocephalic and atraumatic.  Skin:  1.5 cm round, freely movable nodule with overlying black and tiny scab/hyperpigmentation. No surrounding induration, erythema, edema, streaking. No suppuration or bleeding.  Psychiatric: She has a normal mood and affect. Her behavior is normal. Thought content normal.  Vitals reviewed.    Assessment & Plan:   1. Sebaceous cyst - I&D performed. Site cleansed with alcohol and povidone iodine swab. 0.25 cc of 1% Lidocaine with Epinephrine used. Time allowed for anesthesia to take hold. Site draped in sterile fashion with material from laceration tray. 27mm incision made following Langers lines on left forearm. 1.5 to 2 mL of white keratinous material expressed with manual manipulation. Incision closed with 3 simple interrupted stitches. Applied triple antibiotic placed a Bandaid over the wound. Wound hemodynamically stable, no complications, and patient left under her own power.  Gave patient instructions on how to care for wound. Advised to pick up antibiotic at pharmacy in case signs of infection develop. Patient understood and will keep in contact in the event of infection or complication. Asked patient to return in seven day to have sutures removed.    Follow-up: 7 days for suture removal.  Clent Demark PA

## 2017-07-25 ENCOUNTER — Ambulatory Visit (INDEPENDENT_AMBULATORY_CARE_PROVIDER_SITE_OTHER): Payer: Self-pay | Admitting: Physician Assistant

## 2017-07-25 ENCOUNTER — Encounter (INDEPENDENT_AMBULATORY_CARE_PROVIDER_SITE_OTHER): Payer: Self-pay | Admitting: Physician Assistant

## 2017-07-25 DIAGNOSIS — Z114 Encounter for screening for human immunodeficiency virus [HIV]: Secondary | ICD-10-CM

## 2017-07-25 DIAGNOSIS — Z4802 Encounter for removal of sutures: Secondary | ICD-10-CM

## 2017-07-25 NOTE — Progress Notes (Signed)
   Subjective:  Patient ID: Chloe Perez, female    DOB: Aug 03, 1976  Age: 41 y.o. MRN: [id number]  CC: suture removal  HPI Anjalina Bergevin is a 41 y.o. female with a medical history of DM2 presents to have sutures removed. Three of three sutures still intact. She had incision and drainage of a sebaceous cyst nine days ago. Reports wound is healing well, no dehiscence.  No suppuration, bleeding, cellulitis, increased warmth, fever, or chills.    Patient also asks for HIV testing today. She had HIV testing done elsewhere but she is not confident in the results. Does not share why she is concerned. Only wants to "verify" the first HIV result is valid.       Outpatient Medications Prior to Visit  Medication Sig Dispense Refill  . Cephalexin 500 MG tablet Take 1 tablet (500 mg total) by mouth 3 (three) times daily. 30 tablet 0  . glimepiride (AMARYL) 4 MG tablet Take 1 tablet (4 mg total) by mouth daily before breakfast. 30 tablet 3  . glucose blood (ACCU-CHEK GUIDE) test strip Use as instructed 100 each 12  . Lancets (ACCU-CHEK SOFT TOUCH) lancets Use as instructed 100 each 12  . metFORMIN (GLUCOPHAGE) 1000 MG tablet Take 1 tablet (1,000 mg total) by mouth 2 (two) times daily with a meal. 180 tablet 3   No facility-administered medications prior to visit.      ROS Review of Systems  Constitutional: Negative for chills and fever.  Skin: Negative for itching and rash.    Objective:  There were no vitals taken for this visit.  BP/Weight 07/16/2017 07/10/2017 05/15/5092  Systolic BP 267 - 124  Diastolic BP 72 - 64  Wt. (Lbs) 153 - 153  BMI 27.98 - 29.88      Physical Exam  Constitutional: She appears well-developed and well-nourished.  Skin:   Wound on left forearm well approximated. No signs of infection.   Psychiatric: She has a normal mood and affect. Her behavior is normal.     Assessment & Plan:     1. Visit for suture removal - 3 sutures removed  with use of suture removal kit. Wound well approximated. No signs of infection.   2. Encounter for screening for HIV - HIV antibody     Follow-up: PRN   Clent Demark PA

## 2017-07-26 LAB — HIV ANTIBODY (ROUTINE TESTING W REFLEX): HIV SCREEN 4TH GENERATION: NONREACTIVE

## 2017-09-25 ENCOUNTER — Ambulatory Visit: Payer: Self-pay | Admitting: Skilled Nursing Facility1

## 2018-02-02 ENCOUNTER — Inpatient Hospital Stay (HOSPITAL_COMMUNITY): Payer: Self-pay

## 2018-02-02 ENCOUNTER — Other Ambulatory Visit: Payer: Self-pay

## 2018-02-02 ENCOUNTER — Encounter (HOSPITAL_COMMUNITY): Payer: Self-pay | Admitting: Emergency Medicine

## 2018-02-02 ENCOUNTER — Inpatient Hospital Stay (HOSPITAL_COMMUNITY)
Admission: EM | Admit: 2018-02-02 | Discharge: 2018-02-03 | Disposition: A | Discharge status: Other Acute Inpatient Hospital | Payer: Self-pay | Attending: Family Medicine | Admitting: Family Medicine

## 2018-02-02 DIAGNOSIS — R109 Unspecified abdominal pain: Secondary | ICD-10-CM | POA: Insufficient documentation

## 2018-02-02 DIAGNOSIS — Z87891 Personal history of nicotine dependence: Secondary | ICD-10-CM | POA: Insufficient documentation

## 2018-02-02 DIAGNOSIS — Z7984 Long term (current) use of oral hypoglycemic drugs: Secondary | ICD-10-CM | POA: Insufficient documentation

## 2018-02-02 DIAGNOSIS — O26899 Other specified pregnancy related conditions, unspecified trimester: Secondary | ICD-10-CM | POA: Insufficient documentation

## 2018-02-02 DIAGNOSIS — O4692 Antepartum hemorrhage, unspecified, second trimester: Secondary | ICD-10-CM

## 2018-02-02 DIAGNOSIS — E119 Type 2 diabetes mellitus without complications: Secondary | ICD-10-CM | POA: Insufficient documentation

## 2018-02-02 DIAGNOSIS — O209 Hemorrhage in early pregnancy, unspecified: Secondary | ICD-10-CM | POA: Insufficient documentation

## 2018-02-02 DIAGNOSIS — O200 Threatened abortion: Secondary | ICD-10-CM

## 2018-02-02 DIAGNOSIS — O4690 Antepartum hemorrhage, unspecified, unspecified trimester: Secondary | ICD-10-CM

## 2018-02-02 DIAGNOSIS — Z3A00 Weeks of gestation of pregnancy not specified: Secondary | ICD-10-CM | POA: Insufficient documentation

## 2018-02-02 DIAGNOSIS — O24119 Pre-existing diabetes mellitus, type 2, in pregnancy, unspecified trimester: Secondary | ICD-10-CM | POA: Insufficient documentation

## 2018-02-02 LAB — COMPREHENSIVE METABOLIC PANEL
ALK PHOS: 49 U/L (ref 38–126)
ALT: 17 U/L (ref 14–54)
AST: 21 U/L (ref 15–41)
Albumin: 3.6 g/dL (ref 3.5–5.0)
Anion gap: 13 (ref 5–15)
BILIRUBIN TOTAL: 0.5 mg/dL (ref 0.3–1.2)
BUN: 7 mg/dL (ref 6–20)
CALCIUM: 9.7 mg/dL (ref 8.9–10.3)
CO2: 20 mmol/L — ABNORMAL LOW (ref 22–32)
Chloride: 103 mmol/L (ref 101–111)
Creatinine, Ser: 0.54 mg/dL (ref 0.44–1.00)
Glucose, Bld: 170 mg/dL — ABNORMAL HIGH (ref 65–99)
Potassium: 3.5 mmol/L (ref 3.5–5.1)
Sodium: 136 mmol/L (ref 135–145)
Total Protein: 6.8 g/dL (ref 6.5–8.1)

## 2018-02-02 LAB — CBC WITH DIFFERENTIAL/PLATELET
BASOS PCT: 0 %
Basophils Absolute: 0 10*3/uL (ref 0.0–0.1)
EOS ABS: 0.2 10*3/uL (ref 0.0–0.7)
Eosinophils Relative: 2 %
HCT: 39.2 % (ref 36.0–46.0)
HEMOGLOBIN: 13.6 g/dL (ref 12.0–15.0)
Lymphocytes Relative: 32 %
Lymphs Abs: 3.5 10*3/uL (ref 0.7–4.0)
MCH: 29.6 pg (ref 26.0–34.0)
MCHC: 34.7 g/dL (ref 30.0–36.0)
MCV: 85.2 fL (ref 78.0–100.0)
MONOS PCT: 5 %
Monocytes Absolute: 0.6 10*3/uL (ref 0.1–1.0)
NEUTROS PCT: 61 %
Neutro Abs: 6.7 10*3/uL (ref 1.7–7.7)
Platelets: 212 10*3/uL (ref 150–400)
RBC: 4.6 MIL/uL (ref 3.87–5.11)
RDW: 12.7 % (ref 11.5–15.5)
WBC: 11 10*3/uL — AB (ref 4.0–10.5)

## 2018-02-02 LAB — HCG, QUANTITATIVE, PREGNANCY: HCG, BETA CHAIN, QUANT, S: 17112 m[IU]/mL — AB (ref <5)

## 2018-02-02 LAB — ABO/RH: ABO/RH(D): B POS

## 2018-02-02 NOTE — Progress Notes (Signed)
[facility] in to transfer patient.

## 2018-02-02 NOTE — ED Notes (Signed)
[facility] at bedside; patient transferred to [facility].

## 2018-02-02 NOTE — ED Triage Notes (Signed)
Patient said she began bleeding 20 minutes ago she began to spot.  Now she is bleeding more and has a panty shield that is full.  She is also complaining of some right, lower abdomen pain.

## 2018-02-02 NOTE — Progress Notes (Signed)
Tct: Dr. Kennon Rounds, updated on patient status, FHR. Patient to transfer to MAU for further evaluation.

## 2018-02-02 NOTE — ED Notes (Signed)
[facility] called for transport 

## 2018-02-02 NOTE — ED Provider Notes (Signed)
[facility] EMERGENCY DEPARTMENT Provider Note   CSN: [id number] Arrival date & time: 02/02/18  1906     History   Chief Complaint Chief Complaint  Patient presents with  . Vaginal Bleeding    HPI Chloe Perez is a 42 y.o. female.  HPI Patient presents with vaginal bleeding.  Reportedly last menses was early October with it now being mid March.  Around 1 month ago reportedly had blood work and urine pregnancy test showed her pregnant.  Has not had an ultrasound.  Has some blood when she is trying to go to the bathroom has had spotting on the pad.  Slight dull lower abdominal pain.  Does not have an OB check that she sees.  Has had a previous miscarriage.  No other bleeding.  Patient speaks Spanish and was translated using Spanish-speaking nurse. Past Medical History:  Diagnosis Date  . Diabetes mellitus without complication ([place])   . Missed ab     Patient Active Problem List   Diagnosis Date Noted  . Sebaceous cyst 05/01/2017  . PCOS (polycystic ovarian syndrome) 02/08/2017  . 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 Infertility, female, secondary 06/25/2012  . Oligomenorrhea 06/03/2012  . Type II diabetes mellitus ([place]) 06/03/2012    Past Surgical History:  Procedure Laterality Date  . uterin polyps      OB History    Gravida Para Term Preterm AB Living   1 0     1 0   SAB TAB Ectopic Multiple Live Births   1               Home Medications    Prior to Admission medications   Medication Sig Start Date End Date Taking? Authorizing Provider  glimepiride (AMARYL) 4 MG tablet Take 1 tablet (4 mg total) by mouth daily before breakfast. 06/07/17  Yes Clent Demark, PA-C  glucose blood (ACCU-CHEK GUIDE) test strip Use as instructed 06/07/17  Yes Clent Demark, PA-C  Lancets (ACCU-CHEK SOFT [facility]) lancets Use as instructed 06/07/17  Yes Clent Demark, PA-C  metFORMIN (GLUCOPHAGE) 1000 MG tablet Take 1 tablet (1,000 mg total) by mouth 2  (two) times daily with a meal. Patient taking differently: Take 1,000 mg by mouth daily with breakfast.  06/07/17  Yes Clent Demark, PA-C  Prenatal Vit-Fe Fumarate-FA (MULTIVITAMIN-PRENATAL) 27-0.8 MG TABS tablet Take 1 tablet by mouth daily at 12 noon.   Yes [provider]    Family History Family History  Problem Relation Age of Onset  . Diabetes Other   . Cancer Other   . Hypertension Other   . Heart disease Other     Social History Social History   Tobacco Use  . Smoking status: Former Research scientist (life sciences)  . Smokeless tobacco: Never Used  Substance Use Topics  . Alcohol use: Yes    Comment: OCC  . Drug use: No     Allergies   Patient has no known allergies.   Review of Systems Review of Systems  Constitutional: Negative for appetite change.  HENT: Negative for congestion.   Respiratory: Negative for shortness of breath.   Cardiovascular: Negative for chest pain.  Gastrointestinal: Positive for abdominal pain.  Genitourinary: Positive for vaginal bleeding. Negative for flank pain.  Musculoskeletal: Negative for arthralgias.  Neurological: Negative for numbness.  Psychiatric/Behavioral: Negative for confusion.     Physical Exam Updated Vital Signs BP (!) 167/79 (BP Location: Right Arm)   Pulse (!) 104   Temp 98.4 F (36.9 C) (  Oral)   Resp 18   LMP 08/28/2017   SpO2 99%   Physical Exam  Constitutional: She appears well-developed.  HENT:  Head: Atraumatic.  Neck: Neck supple.  Cardiovascular: Normal rate.  Pulmonary/Chest: No respiratory distress.  Abdominal: She exhibits mass.  Small suprapubic mass without real tenderness.  Genitourinary:  Genitourinary Comments: Patient refused  Musculoskeletal: She exhibits no edema.  Neurological: She is alert.  Skin: Skin is warm. Capillary refill takes less than 2 seconds.     ED Treatments / Results  Labs (all labs ordered are listed, but only abnormal results are displayed) Labs Reviewed    COMPREHENSIVE METABOLIC PANEL - Abnormal; Notable for the following components:      Result Value   CO2 20 (*)    Glucose, Bld 170 (*)    All other components within normal limits  CBC WITH DIFFERENTIAL/PLATELET - Abnormal; Notable for the following components:   WBC 11.0 (*)    All other components within normal limits  HCG, QUANTITATIVE, PREGNANCY  ABO/RH    EKG  EKG Interpretation None       Radiology No results found.  Procedures Procedures (including critical care time)  Medications Ordered in ED Medications - No data to display   Initial Impression / Assessment and Plan / ED Course  I have reviewed the triage vital signs and the nursing notes.  Pertinent labs & imaging results that were available during my care of the patient were reviewed by me and considered in my medical decision making (see chart for details).     Patient with vaginal bleeding during pregnancy.  Unknown dates but last period was in October.  Bedside ultrasound did show heartbeat.  [facility] nurse came and saw patient.  Patient refused pelvic exam at this time.  OB nurse discussed with Dr. Kennon Rounds and patient will be transferred to [facility] for more extensive workup.  Final Clinical Impressions(s) / ED Diagnoses   Final diagnoses:  Vaginal bleeding in pregnancy    ED Discharge Orders    None       Davonna Belling, MD 02/02/18 2117

## 2018-02-02 NOTE — MAU Provider Note (Signed)
History     CSN: [id number]  Arrival date and time: 02/02/18 1906   None     Chief Complaint  Patient presents with  . Vaginal Bleeding   42 yo female here for vaginal bleeding and abdominal pain. Home pregnancy test positive 1 month ago. LMP was October 3rd which makes gestational age [redacted]w[redacted]d. No prenatal care. Was seen at ED earlier tonight and transferred here for further evaluation. Quant hcg was 17,000. She has been attempting IVF. Patient declines vaginal exam.      Past Medical History:  Diagnosis Date  . Diabetes mellitus without complication ([place])    DM type 2 metformin   . Missed ab     Past Surgical History:  Procedure Laterality Date  . uterin polyps      Family History  Problem Relation Age of Onset  . Diabetes Other   . Cancer Other   . Hypertension Other   . Heart disease Other     Social History   Tobacco Use  . Smoking status: Former Research scientist (life sciences)  . Smokeless tobacco: Never Used  Substance Use Topics  . Alcohol use: Yes    Comment: OCC  . Drug use: No    Allergies: No Known Allergies  Medications Prior to Admission  Medication Sig Dispense Refill Last Dose  . glimepiride (AMARYL) 4 MG tablet Take 1 tablet (4 mg total) by mouth daily before breakfast. 30 tablet 3 02/02/2018 at Unknown time  . glucose blood (ACCU-CHEK GUIDE) test strip Use as instructed 100 each 12 Past Week at Unknown time  . Lancets (ACCU-CHEK SOFT TOUCH) lancets Use as instructed 100 each 12 02/01/2018 at Unknown time  . metFORMIN (GLUCOPHAGE) 1000 MG tablet Take 1 tablet (1,000 mg total) by mouth 2 (two) times daily with a meal. (Patient taking differently: Take 1,000 mg by mouth daily with breakfast. ) 180 tablet 3 02/02/2018 at Unknown time  . Prenatal Vit-Fe Fumarate-FA (MULTIVITAMIN-PRENATAL) 27-0.8 MG TABS tablet Take 1 tablet by mouth daily at 12 noon.   02/01/2018 at Unknown time    Review of Systems  Constitutional: Negative for chills and fever.  HENT: Negative for congestion  and ear discharge.   Eyes: Negative for discharge and itching.  Respiratory: Negative for apnea and chest tightness.   Cardiovascular: Negative for chest pain and leg swelling.  Gastrointestinal: Positive for abdominal pain. Negative for nausea.  Endocrine: Negative for cold intolerance and heat intolerance.  Genitourinary: Positive for vaginal bleeding. Negative for difficulty urinating and dysuria.  Musculoskeletal: Negative for arthralgias and back pain.  Neurological: Negative for dizziness and headaches.  Hematological: Negative for adenopathy. Does not bruise/bleed easily.   Physical Exam   Blood pressure 129/61, pulse 87, temperature 99.2 F (37.3 C), temperature source Oral, resp. rate 18, last menstrual period 08/28/2017, SpO2 97 %.  Physical Exam  Constitutional: She is oriented to person, place, and time. She appears well-developed and well-nourished.  HENT:  Head: Normocephalic.  Eyes: Conjunctivae are normal. Pupils are equal, round, and reactive to light.  Neck: Normal range of motion. Neck supple.  Cardiovascular: Normal rate.  No murmur heard. Respiratory: Effort normal. No respiratory distress.  GI: Soft. She exhibits no distension.  Musculoskeletal: Normal range of motion. She exhibits no edema.  Neurological: She is alert and oriented to person, place, and time.  Skin: Skin is warm and dry.  Psychiatric: She has a normal mood and affect. Her behavior is normal.    MAU Course  Procedures  MDM US  showed gestation at [redacted]w[redacted]d and placenta is posterior. No explanation why she is bleeding. Patient declined for me to do pelvic exam. She has an appointment on Monday for first prenatal visit and [place], and I advised her to keep these appointments.  Assessment and Plan  1. Threatened abortion 2. Pregnancy at 16 weeks completed gestation  [employer] 02/02/2018, 10:43 PM

## 2018-02-02 NOTE — Progress Notes (Signed)
Telephone call from ED provider, patient in room. Patient c/o vaginal bleeding. ED provider states beside u/s done showing pregnancy with heart tones. Patient without prenatal care but has appointment this Monday for 1st prenatal visit.

## 2018-02-02 NOTE — Discharge Instructions (Signed)
Amenaza de aborto (Threatened Miscarriage) La amenaza de aborto se produce cuando hay hemorragia vaginal durante las primeras 20semanas de [place], pero el embarazo no se interrumpe. El [employer] har pruebas para asegurarse de que el embarazo contine. La causa de la hemorragia puede ser desconocida. Este trastorno no significa que Best boy. Sin embargo, Counsellor riesgo de que el embarazo se interrumpa (aborto completo). CUIDADOS EN EL HOGAR  Asegrese de asistir a todas las citas de cuidados prenatales con el mdico.  Descanse lo suficiente.  No tenga relaciones sexuales ni use tampones si tiene hemorragia vaginal.  No se haga duchas vaginales.  No fume ni consuma drogas.  No beba alcohol.  Evite la cafena. SOLICITE AYUDA SI:  Tiene una hemorragia leve de la vagina.  Tiene dolor o clicos abdominales.  Tiene fiebre. SOLICITE AYUDA DE INMEDIATO SI:  Tiene hemorragia abundante de la vagina.  Elimina cogulos de sangre por la vagina.  Tiene mucho dolor en el abdomen o la parte baja de la espalda, clicos abdominales o calambres en la parte baja de la espalda.  Tiene fiebre, escalofros y mucho dolor abdominal. ASEGRESE DE QUE:  Comprende estas instrucciones.  Controlar su afeccin.  Recibir ayuda de inmediato si no mejora o si empeora. Esta informacin no tiene Marine scientist el consejo del mdico. Asegrese de hacerle al mdico cualquier pregunta que tenga. Document Released: 12/16/2010 Document Revised: 11/18/2013 Document Reviewed: 09/09/2013 Elsevier Interactive Patient Education  [employer].

## 2018-02-02 NOTE — ED Notes (Signed)
[facility] made aware of patient's presence.  ASKED TO CALL BACK WHEN PATIENT ROOMED

## 2018-02-02 NOTE — ED Notes (Signed)
[facility] is at bedside; Alvino Chapel also spoke with [facility] for plan of care.

## 2018-02-02 NOTE — Progress Notes (Addendum)
G2P0 [redacted] wksga by last LMP. appt setup next week on Monday for U/s. Started bleeding this evening. On feet all day. Back hurting (hair dresser). Felt something come out when using bathroom and saw some blood.   Doppler 156   Noted scant blood in pad.   Hx: Type 2 DM metformin and glyberide. AMA at 67.   Invitro year ago and didn't work.   2233: U/S notified.   2244: pt to U/S via wheelchair   2303: back from [facility]: relinquished care over to RN Heywood Bene

## 2018-02-02 NOTE — Progress Notes (Signed)
Arrived to see patient, EFM monitor obtained and connected to patient.  FHT by dopler, TOCO applied.  Patient states LMP 08/27/2017 but positive pregnancy test 3 weeks ago.

## 2018-02-02 NOTE — Progress Notes (Signed)
[facility] notified of transfer.

## 2018-02-02 NOTE — Progress Notes (Signed)
Bedside u/s done by ED provider, fetal movement noted and heartbeat present.

## 2018-02-02 NOTE — Progress Notes (Signed)
Patient reports first prenatal visit this Monday.  LMP August 27, 2017. FHT obtained with Doppler, FHT 154. Patient denies contractions.  Toco monitor applied.

## 2018-02-06 ENCOUNTER — Encounter: Payer: Self-pay | Admitting: Obstetrics

## 2018-02-06 DIAGNOSIS — O0990 Supervision of high risk pregnancy, unspecified, unspecified trimester: Secondary | ICD-10-CM | POA: Insufficient documentation

## 2018-02-10 ENCOUNTER — Encounter: Payer: Self-pay | Admitting: Certified Nurse Midwife

## 2018-02-10 ENCOUNTER — Inpatient Hospital Stay (HOSPITAL_COMMUNITY)
Admission: AD | Admit: 2018-02-10 | Discharge: 2018-02-10 | Disposition: A | Discharge status: Home / Self Care | Payer: Self-pay | Source: Ambulatory Visit | Attending: Obstetrics & Gynecology | Admitting: Obstetrics & Gynecology

## 2018-02-10 ENCOUNTER — Other Ambulatory Visit: Payer: Self-pay

## 2018-02-10 DIAGNOSIS — O468X2 Other antepartum hemorrhage, second trimester: Secondary | ICD-10-CM

## 2018-02-10 DIAGNOSIS — Z6790 Unspecified blood type, Rh positive: Secondary | ICD-10-CM

## 2018-02-10 DIAGNOSIS — O09512 Supervision of elderly primigravida, second trimester: Secondary | ICD-10-CM | POA: Insufficient documentation

## 2018-02-10 DIAGNOSIS — O24912 Unspecified diabetes mellitus in pregnancy, second trimester: Secondary | ICD-10-CM | POA: Insufficient documentation

## 2018-02-10 DIAGNOSIS — Z87891 Personal history of nicotine dependence: Secondary | ICD-10-CM | POA: Insufficient documentation

## 2018-02-10 DIAGNOSIS — Z3A18 18 weeks gestation of pregnancy: Secondary | ICD-10-CM

## 2018-02-10 DIAGNOSIS — O418X20 Other specified disorders of amniotic fluid and membranes, second trimester, not applicable or unspecified: Secondary | ICD-10-CM

## 2018-02-10 DIAGNOSIS — Z7984 Long term (current) use of oral hypoglycemic drugs: Secondary | ICD-10-CM | POA: Insufficient documentation

## 2018-02-10 DIAGNOSIS — O26852 Spotting complicating pregnancy, second trimester: Secondary | ICD-10-CM

## 2018-02-10 NOTE — MAU Provider Note (Signed)
History     CSN: [id number]  Arrival date and time: 02/10/18 1836   None     Chief Complaint  Patient presents with  . Vaginal Discharge    brownish color   G2P0010 @18 .0 wks here with spotting. Had bright red bleeding last week but now having drk brown spotting x3 days. Changing panty liner 3 times a day. No LOF. +FM. No recent IC. No abdominal pain or cramping. Was seen last week in MAU for VB and US showed [facility].     OB History    Gravida Para Term Preterm AB Living   2 0     1 0   SAB TAB Ectopic Multiple Live Births   1              Past Medical History:  Diagnosis Date  . Diabetes mellitus without complication ([place])    DM type 2 metformin   . Missed ab     Past Surgical History:  Procedure Laterality Date  . uterin polyps      Family History  Problem Relation Age of Onset  . Diabetes Other   . Cancer Other   . Hypertension Other   . Heart disease Other     Social History   Tobacco Use  . Smoking status: Former Research scientist (life sciences)  . Smokeless tobacco: Never Used  Substance Use Topics  . Alcohol use: Yes    Comment: OCC  . Drug use: No    Allergies: No Known Allergies  No medications prior to admission.    Review of Systems  Gastrointestinal: Negative for abdominal pain.  Genitourinary: Positive for vaginal bleeding.   Physical Exam   Blood pressure 134/72, pulse 78, temperature 98.5 F (36.9 C), temperature source Oral, resp. rate 18, last menstrual period 08/28/2017.  Physical Exam  Nursing note and vitals reviewed. Constitutional: She is oriented to person, place, and time. She appears well-developed and well-nourished. No distress.  HENT:  Head: Normocephalic and atraumatic.  Neck: Normal range of motion.  Cardiovascular: Normal rate.  Respiratory: Effort normal. No respiratory distress.  Genitourinary:  Genitourinary Comments: External: no lesions or erythema Vagina: rugated, pink, moist, small thick drk brown bloody discharge Cervix  closed/long   Musculoskeletal: Normal range of motion.  Neurological: She is alert and oriented to person, place, and time.  Skin: Skin is warm and dry.  Psychiatric: She has a normal mood and affect.  FHT 153  No results found for this or any previous visit (from the past 24 hour(s)).  MAU Course  Procedures  MDM GC cultures pending from office, not recollected today. No previa seen on [place] last week. Continued bleeding likely d/t previously seen [facility]. Discussed bleeding precautions. Stable for discharge home.  Assessment and Plan   1. [redacted] weeks gestation of pregnancy   2. Spotting affecting pregnancy in second trimester   3. Blood type, Rh positive   4. Subchorionic hematoma in second trimester, single or unspecified fetus    Discharge home Follow up in OB office this week as scheduled Bleeding/return precautions  Allergies as of 02/10/2018   No Known Allergies     Medication List    STOP taking these medications   glimepiride 4 MG tablet Commonly known as:  AMARYL     TAKE these medications   accu-chek soft touch lancets Use as instructed   glucose blood test strip Commonly known as:  ACCU-CHEK GUIDE Use as instructed   metFORMIN 1000 MG tablet Commonly known as:  GLUCOPHAGE Take 1 tablet (1,000 mg total) by mouth 2 (two) times daily with a meal. What changed:  when to take this   multivitamin-prenatal 27-0.8 MG Tabs tablet Take 1 tablet by mouth daily at 12 noon.      Live interpreter used for encounter.  Julianne Handler, CNM 02/10/2018, 7:41 PM

## 2018-02-10 NOTE — MAU Note (Addendum)
G2P0 @ [redacted] wksga. Here dt brownish discharge when wiping. Last intercourse 13 days ago. Denies LOF.   Interpreter at bs.  Doppler 153  1900: Provider at bs assessing. Speculum and Pelvic exam done  1920: d/c instructions given with pt understanding. Pt left unit via ambulatory.

## 2018-02-10 NOTE — Discharge Instructions (Signed)
Hemorragia vaginal durante el embarazo (segundo trimestre) (Vaginal Bleeding During Pregnancy, Second Trimester) Durante el embarazo, es comn tener una pequea hemorragia vaginal (manchas). A veces, la hemorragia es normal y no representa un problema, pero en algunas ocasiones es un sntoma de algo grave. Asegrese de decirle a su mdico de inmediato si tiene algn tipo de hemorragia vaginal. CUIDADOS EN EL HOGAR  Controle su afeccin para ver si hay cambios.  Siga las indicaciones de su mdico con respecto al [place] de actividad que [place].  Si debe hacer reposo en cama: ? Es posible que deba quedarse en cama y levantarse nicamente para ir al bao. ? Building control surveyor [employer]. ? Si es necesario, planifique que alguien la ayude.  Alla German: ? La cantidad de toallas higinicas que [place] cada da. ? La frecuencia con la que se cambia las toallas higinicas. ? Indique que tan empapados (saturados) estn.  No use tampones.  No se haga duchas vaginales.  No tenga relaciones sexuales ni orgasmos hasta que el mdico la autorice.  Si elimina tejido por la vagina, gurdelo para mostrrselo al [employer].  Tome los medicamentos solamente como se lo haya indicado el mdico.  No tome aspirina, ya que puede causar hemorragias.  No haga ejercicios, no levante objetos pesados ni haga ninguna actividad que exija mucha energa y esfuerzo, salvo que su mdico la autorice.  Concurra a todas las visitas de control como se lo haya indicado el mdico. SOLICITE AYUDA SI:  Tiene una hemorragia vaginal.  Tiene clicos.  Tiene dolores de [place].  Tiene fiebre que no desaparece despus de Geophysical data processor. SOLICITE AYUDA DE INMEDIATO SI:  Siente clicos muy intensos en la espalda o en el vientre (abdomen).  Siente contracciones.  Tiene escalofros.  Elimina cogulos grandes o tejido por la vagina.  Tiene ms hemorragia.  Se siente dbil o que va a desvanecerse.  Pierde  el conocimiento (se desmaya).  Tiene una prdida importante o sale lquido a borbotones por la vagina. ASEGRESE DE QUE:  Comprende estas instrucciones.  Controlar su afeccin.  Recibir ayuda de inmediato si no mejora o si empeora. Esta informacin no tiene Marine scientist el consejo del mdico. Asegrese de hacerle al mdico cualquier pregunta que tenga. Document Released: 03/30/2014 Document Revised: 03/30/2014 Document Reviewed: 07/21/2013 Elsevier Interactive Patient Education  [employer].

## 2018-02-14 ENCOUNTER — Encounter: Payer: Self-pay | Admitting: Certified Nurse Midwife

## 2018-02-14 ENCOUNTER — Ambulatory Visit (INDEPENDENT_AMBULATORY_CARE_PROVIDER_SITE_OTHER): Payer: Self-pay | Admitting: Certified Nurse Midwife

## 2018-02-14 VITALS — BP 123/72 | HR 85 | Wt 154.6 lb

## 2018-02-14 DIAGNOSIS — Z113 Encounter for screening for infections with a predominantly sexual mode of transmission: Secondary | ICD-10-CM

## 2018-02-14 DIAGNOSIS — O09529 Supervision of elderly multigravida, unspecified trimester: Secondary | ICD-10-CM | POA: Insufficient documentation

## 2018-02-14 DIAGNOSIS — Z789 Other specified health status: Secondary | ICD-10-CM

## 2018-02-14 DIAGNOSIS — N979 Female infertility, unspecified: Secondary | ICD-10-CM

## 2018-02-14 DIAGNOSIS — E118 Type 2 diabetes mellitus with unspecified complications: Secondary | ICD-10-CM

## 2018-02-14 DIAGNOSIS — O0990 Supervision of high risk pregnancy, unspecified, unspecified trimester: Secondary | ICD-10-CM

## 2018-02-14 DIAGNOSIS — O09522 Supervision of elderly multigravida, second trimester: Secondary | ICD-10-CM

## 2018-02-14 MED ORDER — METFORMIN HCL 1000 MG PO TABS: 1000.0000 mg | ORAL_TABLET | Freq: Two times a day (BID) | ORAL | 3 refills | Status: DC

## 2018-02-14 MED ORDER — ASPIRIN 81 MG PO CHEW: 81.0000 mg | CHEWABLE_TABLET | Freq: Every day | ORAL | 12 refills | Status: DC

## 2018-02-14 NOTE — Progress Notes (Signed)
Subjective:   Chloe Perez is a 42 y.o. G2P0010 at 42w4dby LMP, midtrimester ultrasound being seen today for her first obstetrical visit.  Her obstetrical history is significant for advanced maternal age, obesity and hx of polyp removal surgery (records request done), Adopt a mom patient, Hx of infertility, pregnancy from Clomid, hx of DM2 (uncontrolled), non-english speaking spanish patient.  [place] does [place] know how many weeks she was, occurred after pelvic exam.  Very nevous for pelvic exam.  Patient does intend to breast feed. Pregnancy history fully reviewed.  Patient reports no contractions, no cramping, no leaking and currently having brown spotting.  HISTORY: OB History  Gravida Para Term Preterm AB Living  2 0 0 0 1 0  SAB TAB Ectopic Multiple Live Births  1 0 0 0 0    # Outcome Date GA Lbr Len/2nd Weight Sex Delivery Anes PTL Lv  2 Current           1 SAB             Last pap smear was done unknown, declined today.    Past Medical History:  Diagnosis Date  . Diabetes mellitus without complication ([place]    DM type 2 metformin   . Missed ab    Past Surgical History:  Procedure Laterality Date  . uterin polyps     Family History  Problem Relation Age of Onset  . Diabetes Other   . Cancer Other   . Hypertension Other   . Heart disease Other   . Diabetes Mother   . Diabetes Father    Social History   Tobacco Use  . Smoking status: Former SResearch scientist (life sciences) . Smokeless tobacco: Never Used  Substance Use Topics  . Alcohol use: Yes    Comment: before pregnancy  . Drug use: No   No Known Allergies Current Outpatient Medications on File Prior to Visit  Medication Sig Dispense Refill  . glucose blood (ACCU-CHEK GUIDE) test strip Use as instructed 100 each 12  . Lancets (ACCU-CHEK SOFT TOUCH) lancets Use as instructed 100 each 12  . Prenatal Vit-Fe Fumarate-FA (MULTIVITAMIN-PRENATAL) 27-0.8 MG TABS tablet Take 1 tablet by mouth  daily at 12 noon.     No current facility-administered medications on file prior to visit.     Review of Systems Pertinent items noted in HPI and remainder of comprehensive ROS otherwise negative.  Exam   Vitals:   02/14/18 1316  BP: 123/72  Pulse: 85  Weight: 154 lb 9.6 oz (70.1 kg)   Fetal Heart Rate (bpm): 160; doppler  Uterus:  Fundal Height: 20 cm  Pelvic Exam: Perineum: no hemorrhoids, normal perineum   Vulva: normal external genitalia, no lesions   Vagina:  normal mucosa, normal discharge   Cervix: no lesions and normal, pap smear deferred. Cervix very posterior.    Adnexa: normal adnexa and no mass, fullness, tenderness   Bony Pelvis: average  System: General: well-developed, well-nourished female in no acute distress   Breast:  normal appearance, no masses or tenderness   Skin: normal coloration and turgor, no rashes   Neurologic: oriented, normal, negative, normal mood   Extremities: normal strength, tone, and muscle mass, ROM of all joints is normal   HEENT PERRLA, extraocular movement intact and sclera clear, anicteric   Mouth/Teeth mucous membranes moist, pharynx normal without lesions and dental hygiene good   Neck supple and no masses   Cardiovascular: regular rate and rhythm  Respiratory:  no respiratory distress, normal breath sounds   Abdomen: soft, non-tender; bowel sounds normal; no masses,  no organomegaly     Assessment:   Pregnancy: G2P0010 Patient Active Problem List   Diagnosis Date Noted  . Non-English speaking patient 02/14/2018  . Advanced maternal age in multigravida 02/14/2018  . Supervision of high risk pregnancy, antepartum 02/06/2018  . Sebaceous cyst 05/01/2017  . PCOS (polycystic ovarian syndrome) 02/08/2017  . 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 Infertility, female, secondary 06/25/2012  . Oligomenorrhea 06/03/2012  . Type II diabetes mellitus ([place]) 06/03/2012     Plan:  1. Supervision of high risk pregnancy, antepartum    -  [place] MFM OB DETAIL +14 WK; Future - Hemoglobinopathy evaluation - Obstetric Panel, Including HIV - Hemoglobin A1c - TSH Pregnancy - Cytology - PAP - Cervicovaginal ancillary only  2. Infertility, female, secondary       3. Type 2 diabetes mellitus with complication, without long-term current use of insulin (HCC)      Reports 2 hour PP:  154.  Was on glyburide as well.  - metFORMIN (GLUCOPHAGE) 1000 MG tablet; Take 1 tablet (1,000 mg total) by mouth 2 (two) times daily with a meal.  Dispense: 180 tablet; Refill: 3 - Comp Met (CMET) - Protein / creatinine ratio, urine - aspirin 81 MG chewable tablet; Chew 1 tablet (81 mg total) by mouth daily.  Dispense: 30 tablet; Refill: 12 - ECHO FETAL; Future - Ambulatory referral to Ophthalmology  4. Non-English speaking patient     Here for exam with interpreter  5. Elderly multigravida in second trimester     42 years old will need antenatal testing    Initial labs drawn. Continue prenatal vitamins. Genetic Screening discussed, NIPS: declined. Ultrasound discussed; fetal anatomic survey: ordered at [facility]. Problem list reviewed and updated. The nature of [place] with multiple MDs and other Advanced Practice Providers was explained to patient; also emphasized that residents, students are part of our team. Routine obstetric precautions reviewed. Return in about 2 weeks (around 02/28/2018) for [facility], Needs to see FP MD here.     Kandis Cocking, [place] for [employer], [place]

## 2018-02-15 LAB — OBSTETRIC PANEL, INCLUDING HIV
Antibody Screen: NEGATIVE
Basophils Absolute: 0 10*3/uL (ref 0.0–0.2)
Basos: 0 %
EOS (ABSOLUTE): 0.2 10*3/uL (ref 0.0–0.4)
EOS: 2 %
HEMOGLOBIN: 13.4 g/dL (ref 11.1–15.9)
HEP B S AG: NEGATIVE
HIV Screen 4th Generation wRfx: NONREACTIVE
Hematocrit: 40.2 % (ref 34.0–46.6)
IMMATURE GRANS (ABS): 0.1 10*3/uL (ref 0.0–0.1)
IMMATURE GRANULOCYTES: 1 %
Lymphocytes Absolute: 2.4 10*3/uL (ref 0.7–3.1)
Lymphs: 23 %
MCH: 29.2 pg (ref 26.6–33.0)
MCHC: 33.3 g/dL (ref 31.5–35.7)
MCV: 88 fL (ref 79–97)
MONOS ABS: 0.7 10*3/uL (ref 0.1–0.9)
Monocytes: 6 %
NEUTROS PCT: 68 %
Neutrophils Absolute: 7.1 10*3/uL — ABNORMAL HIGH (ref 1.4–7.0)
Platelets: 246 10*3/uL (ref 150–379)
RBC: 4.59 x10E6/uL (ref 3.77–5.28)
RDW: 13.8 % (ref 12.3–15.4)
RH TYPE: POSITIVE
RPR Ser Ql: NONREACTIVE
Rubella Antibodies, IGG: 14.1 index (ref >0.99)
WBC: 10.3 10*3/uL (ref 3.4–10.8)

## 2018-02-15 LAB — HEMOGLOBINOPATHY EVALUATION
HEMOGLOBIN A2 QUANTITATION: 2.3 % (ref 1.8–3.2)
HGB C: 0 %
HGB S: 0 %
HGB VARIANT: 0 %
Hemoglobin F Quantitation: 0 % (ref 0.0–2.0)
Hgb A: 97.7 % (ref 96.4–98.8)

## 2018-02-15 LAB — COMPREHENSIVE METABOLIC PANEL
A/G RATIO: 1.4 (ref 1.2–2.2)
ALT: 13 IU/L (ref 0–32)
AST: 12 IU/L (ref 0–40)
Albumin: 4 g/dL (ref 3.5–5.5)
Alkaline Phosphatase: 49 IU/L (ref 39–117)
BUN/Creatinine Ratio: 17 (ref 9–23)
BUN: 8 mg/dL (ref 6–24)
Bilirubin Total: <0.2 mg/dL (ref 0.0–1.2)
CALCIUM: 9.3 mg/dL (ref 8.7–10.2)
CHLORIDE: 102 mmol/L (ref 96–106)
CO2: 19 mmol/L — ABNORMAL LOW (ref 20–29)
Creatinine, Ser: 0.47 mg/dL — ABNORMAL LOW (ref 0.57–1.00)
GFR calc Af Amer: 143 mL/min/{1.73_m2} (ref >59)
GFR, EST NON AFRICAN AMERICAN: 124 mL/min/{1.73_m2} (ref >59)
GLOBULIN, TOTAL: 2.8 g/dL (ref 1.5–4.5)
Glucose: 152 mg/dL — ABNORMAL HIGH (ref 65–99)
Potassium: 4.2 mmol/L (ref 3.5–5.2)
Sodium: 137 mmol/L (ref 134–144)
TOTAL PROTEIN: 6.8 g/dL (ref 6.0–8.5)

## 2018-02-15 LAB — PROTEIN / CREATININE RATIO, URINE
CREATININE, UR: 133.7 mg/dL
PROTEIN UR: 14.5 mg/dL
PROTEIN/CREAT RATIO: 108 mg/g{creat} (ref 0–200)

## 2018-02-15 LAB — TSH PREGNANCY: TSH Pregnancy: 2.05 u[IU]/mL (ref 0.450–4.500)

## 2018-02-15 LAB — CERVICOVAGINAL ANCILLARY ONLY
BACTERIAL VAGINITIS: NEGATIVE
Candida vaginitis: NEGATIVE
Chlamydia: NEGATIVE
NEISSERIA GONORRHEA: NEGATIVE
Trichomonas: NEGATIVE

## 2018-02-15 LAB — HEMOGLOBIN A1C
ESTIMATED AVERAGE GLUCOSE: 157 mg/dL
HEMOGLOBIN A1C: 7.1 % — AB (ref 4.8–5.6)

## 2018-02-16 LAB — URINE CULTURE, OB REFLEX

## 2018-02-16 LAB — CULTURE, OB URINE

## 2018-02-21 ENCOUNTER — Other Ambulatory Visit: Payer: Self-pay | Admitting: Certified Nurse Midwife

## 2018-02-21 ENCOUNTER — Ambulatory Visit (HOSPITAL_COMMUNITY)
Admission: RE | Admit: 2018-02-21 | Discharge: 2018-02-21 | Disposition: A | Discharge status: Home / Self Care | Payer: Self-pay | Source: Ambulatory Visit | Attending: Certified Nurse Midwife | Admitting: Certified Nurse Midwife

## 2018-02-21 DIAGNOSIS — Z3689 Encounter for other specified antenatal screening: Secondary | ICD-10-CM | POA: Insufficient documentation

## 2018-02-21 DIAGNOSIS — O09522 Supervision of elderly multigravida, second trimester: Secondary | ICD-10-CM

## 2018-02-21 DIAGNOSIS — O24112 Pre-existing diabetes mellitus, type 2, in pregnancy, second trimester: Secondary | ICD-10-CM | POA: Insufficient documentation

## 2018-02-21 DIAGNOSIS — O0990 Supervision of high risk pregnancy, unspecified, unspecified trimester: Secondary | ICD-10-CM

## 2018-02-21 DIAGNOSIS — O4692 Antepartum hemorrhage, unspecified, second trimester: Secondary | ICD-10-CM | POA: Insufficient documentation

## 2018-02-21 DIAGNOSIS — O09812 Supervision of pregnancy resulting from assisted reproductive technology, second trimester: Secondary | ICD-10-CM | POA: Insufficient documentation

## 2018-02-21 DIAGNOSIS — Z3A25 25 weeks gestation of pregnancy: Secondary | ICD-10-CM | POA: Insufficient documentation

## 2018-02-21 DIAGNOSIS — O0992 Supervision of high risk pregnancy, unspecified, second trimester: Secondary | ICD-10-CM | POA: Insufficient documentation

## 2018-02-21 NOTE — Addendum Note (Signed)
Encounter addended by: Hessie Dibble, RDMS on: 02/21/2018 4:58 PM  Actions taken: Imaging Exam ended

## 2018-02-21 NOTE — Addendum Note (Signed)
Encounter addended by: Precious Gilding, RN on: 02/21/2018 4:54 PM  Actions taken: OB Dating navigator section updated

## 2018-02-22 ENCOUNTER — Other Ambulatory Visit (HOSPITAL_COMMUNITY): Payer: Self-pay | Admitting: *Deleted

## 2018-02-22 DIAGNOSIS — O24119 Pre-existing diabetes mellitus, type 2, in pregnancy, unspecified trimester: Secondary | ICD-10-CM

## 2018-02-22 DIAGNOSIS — Z7984 Long term (current) use of oral hypoglycemic drugs: Principal | ICD-10-CM

## 2018-02-25 ENCOUNTER — Encounter: Payer: Self-pay | Admitting: *Deleted

## 2018-02-25 ENCOUNTER — Ambulatory Visit (INDEPENDENT_AMBULATORY_CARE_PROVIDER_SITE_OTHER): Payer: Self-pay | Admitting: Obstetrics and Gynecology

## 2018-02-25 ENCOUNTER — Encounter: Payer: Self-pay | Admitting: Obstetrics and Gynecology

## 2018-02-25 VITALS — BP 132/79 | HR 82 | Wt 160.0 lb

## 2018-02-25 DIAGNOSIS — O0990 Supervision of high risk pregnancy, unspecified, unspecified trimester: Secondary | ICD-10-CM

## 2018-02-25 DIAGNOSIS — O24919 Unspecified diabetes mellitus in pregnancy, unspecified trimester: Secondary | ICD-10-CM | POA: Insufficient documentation

## 2018-02-25 DIAGNOSIS — O24119 Pre-existing diabetes mellitus, type 2, in pregnancy, unspecified trimester: Secondary | ICD-10-CM

## 2018-02-25 DIAGNOSIS — O24112 Pre-existing diabetes mellitus, type 2, in pregnancy, second trimester: Secondary | ICD-10-CM

## 2018-02-25 DIAGNOSIS — O0992 Supervision of high risk pregnancy, unspecified, second trimester: Secondary | ICD-10-CM

## 2018-02-25 DIAGNOSIS — O09522 Supervision of elderly multigravida, second trimester: Secondary | ICD-10-CM

## 2018-02-25 MED ORDER — ASPIRIN EC 81 MG PO TBEC: 81.0000 mg | DELAYED_RELEASE_TABLET | Freq: Every day | ORAL | 2 refills | Status: DC

## 2018-02-25 MED ORDER — VITAFOL ULTRA 29-0.6-0.4-200 MG PO CAPS: 1.0000 | ORAL_CAPSULE | Freq: Every day | ORAL | 12 refills | Status: DC

## 2018-02-25 NOTE — Progress Notes (Addendum)
   PRENATAL VISIT NOTE  Subjective:  Chloe Perez is a 42 y.o. G2P0010 at [redacted]w[redacted]d being seen today for ongoing prenatal care.  She is currently monitored for the following issues for this high-risk pregnancy and has Oligomenorrhea; Type II diabetes mellitus ([place]); Infertility, female, secondary; PCOS (polycystic ovarian syndrome); Female hypogonadism; Sebaceous cyst; Supervision of high risk pregnancy, antepartum; Non-English speaking patient; Advanced maternal age in multigravida; and Diabetes mellitus in pregnancy, antepartum on their problem list.  Patient reports no complaints.  Contractions: Not present. Vag. Bleeding: None.  Movement: Present. Denies leaking of fluid.   The following portions of the patient's history were reviewed and updated as appropriate: allergies, current medications, past family history, past medical history, past social history, past surgical history and problem list. Problem list updated.  Objective:   Vitals:   02/25/18 0818  BP: 132/79  Pulse: 82  Weight: 160 lb (72.6 kg)    Fetal Status: Fetal Heart Rate (bpm): 154 Fundal Height: 20 cm Movement: Present     General:  Alert, oriented and cooperative. Patient is in no acute distress.  Skin: Skin is warm and dry. No rash noted.   Cardiovascular: Normal heart rate noted  Respiratory: Normal respiratory effort, no problems with respiration noted  Abdomen: Soft, gravid, appropriate for gestational age.  Pain/Pressure: Absent     Pelvic: Cervical exam deferred        Extremities: Normal range of motion.  Edema: None  Mental Status: Normal mood and affect. Normal behavior. Normal judgment and thought content.   Assessment and Plan:  Pregnancy: G2P0010 at [redacted]w[redacted]d  1. Supervision of high risk pregnancy, antepartum Patient is doing well without complaints  2. Pre-existing type 2 diabetes mellitus during pregnancy, antepartum Patient did not bring log book. She is currently on metformin and  glyburide She reports fasting CBGs in 110-120's and pp 160-170. Reviewed normal values with the patient. Patient did not change diet and is consuming a lot of rice and tortillas Discussed maternal/fetal effects of poorly controlled diabetes in pregnancy including fetal birth defects, IUFD, hypoglycemia, seizures, prolonged NICU stay and neonatal death Will have patient return in 2 weeks for ROB. Discussed starting insulin if CBGs remain poorly controlled - Fetal echo on 4/18 - follow up growth ultrasound on 4/24 - Patient did not start ASA- Rx provided today  3. Multigravida of advanced maternal age in second trimester Declined genetic testing due to cost  Preterm labor symptoms and general obstetric precautions including but not limited to vaginal bleeding, contractions, leaking of fluid and fetal movement were reviewed in detail with the patient. Please refer to After Visit Summary for other counseling recommendations.  Return in about 2 weeks (around 03/11/2018) for Chloe Perez.  Future Appointments  Date Time Provider [place]  03/21/2018  8:30 AM [place] 1 WH-MFCUS MFC-US    Mora Bellman, MD

## 2018-02-26 ENCOUNTER — Other Ambulatory Visit: Payer: Self-pay | Admitting: Certified Nurse Midwife

## 2018-02-26 DIAGNOSIS — O0990 Supervision of high risk pregnancy, unspecified, unspecified trimester: Secondary | ICD-10-CM

## 2018-03-04 ENCOUNTER — Other Ambulatory Visit: Payer: Self-pay | Admitting: Certified Nurse Midwife

## 2018-03-04 DIAGNOSIS — O0990 Supervision of high risk pregnancy, unspecified, unspecified trimester: Secondary | ICD-10-CM

## 2018-03-11 ENCOUNTER — Ambulatory Visit (INDEPENDENT_AMBULATORY_CARE_PROVIDER_SITE_OTHER): Payer: Self-pay | Admitting: Obstetrics and Gynecology

## 2018-03-11 ENCOUNTER — Encounter: Payer: Self-pay | Admitting: Obstetrics and Gynecology

## 2018-03-11 VITALS — BP 126/78 | HR 92 | Wt 160.4 lb

## 2018-03-11 DIAGNOSIS — O24119 Pre-existing diabetes mellitus, type 2, in pregnancy, unspecified trimester: Secondary | ICD-10-CM

## 2018-03-11 DIAGNOSIS — O0992 Supervision of high risk pregnancy, unspecified, second trimester: Secondary | ICD-10-CM

## 2018-03-11 DIAGNOSIS — O0990 Supervision of high risk pregnancy, unspecified, unspecified trimester: Secondary | ICD-10-CM

## 2018-03-11 DIAGNOSIS — O24112 Pre-existing diabetes mellitus, type 2, in pregnancy, second trimester: Secondary | ICD-10-CM

## 2018-03-11 DIAGNOSIS — O09522 Supervision of elderly multigravida, second trimester: Secondary | ICD-10-CM

## 2018-03-11 NOTE — Progress Notes (Signed)
   PRENATAL VISIT NOTE  Subjective:  Chloe Perez is a 42 y.o. G2P0010 at [redacted]w[redacted]d being seen today for ongoing prenatal care.  She is currently monitored for the following issues for this high-risk pregnancy and has Oligomenorrhea; Type II diabetes mellitus ([place]); Infertility, female, secondary; PCOS (polycystic ovarian syndrome); Female hypogonadism; Sebaceous cyst; Supervision of high risk pregnancy, antepartum; Non-English speaking patient; Advanced maternal age in multigravida; and Diabetes mellitus in pregnancy, antepartum on their problem list.  Patient reports no complaints.  Contractions: Not present. Vag. Bleeding: None.  Movement: Present. Denies leaking of fluid.   The following portions of the patient's history were reviewed and updated as appropriate: allergies, current medications, past family history, past medical history, past social history, past surgical history and problem list. Problem list updated.  Objective:   Vitals:   03/11/18 0816  BP: 126/78  Pulse: 92  Weight: 160 lb 6.4 oz (72.8 kg)    Fetal Status: Fetal Heart Rate (bpm): 155 Fundal Height: 22 cm Movement: Present     General:  Alert, oriented and cooperative. Patient is in no acute distress.  Skin: Skin is warm and dry. No rash noted.   Cardiovascular: Normal heart rate noted  Respiratory: Normal respiratory effort, no problems with respiration noted  Abdomen: Soft, gravid, appropriate for gestational age.  Pain/Pressure: Present     Pelvic: Cervical exam deferred        Extremities: Normal range of motion.  Edema: None  Mental Status: Normal mood and affect. Normal behavior. Normal judgment and thought content.   Assessment and Plan:  Pregnancy: G2P0010 at [redacted]w[redacted]d  1. Supervision of high risk pregnancy, antepartum Patient is doing well without complaints  2. Pre-existing type 2 diabetes mellitus during pregnancy, antepartum Patient did not bring CBG log She reports fasting as high as 110 and  pp as high as 126. She consumes a fruit smoothie nightly. Advised to change smoothie to protein rich snack Fetal echo and growth ultrasound ordered  3. Multigravida of advanced maternal age in second trimester Declined genetic testing  Preterm labor symptoms and general obstetric precautions including but not limited to vaginal bleeding, contractions, leaking of fluid and fetal movement were reviewed in detail with the patient. Please refer to After Visit Summary for other counseling recommendations.  Return in about 3 weeks (around 04/01/2018) for ROB.  Future Appointments  Date Time Provider [place]  03/21/2018  8:30 AM [place] 1 WH-MFCUS MFC-US    Mora Bellman, MD

## 2018-03-14 ENCOUNTER — Encounter (HOSPITAL_COMMUNITY): Payer: Self-pay

## 2018-03-21 ENCOUNTER — Ambulatory Visit (HOSPITAL_COMMUNITY): Payer: Self-pay

## 2018-03-21 ENCOUNTER — Encounter (HOSPITAL_COMMUNITY): Payer: Self-pay

## 2018-04-01 ENCOUNTER — Encounter: Payer: Self-pay | Admitting: Obstetrics and Gynecology

## 2018-04-01 ENCOUNTER — Ambulatory Visit (INDEPENDENT_AMBULATORY_CARE_PROVIDER_SITE_OTHER): Payer: Self-pay | Admitting: Obstetrics and Gynecology

## 2018-04-01 VITALS — BP 130/81 | HR 96 | Wt 157.0 lb

## 2018-04-01 DIAGNOSIS — O24119 Pre-existing diabetes mellitus, type 2, in pregnancy, unspecified trimester: Secondary | ICD-10-CM

## 2018-04-01 DIAGNOSIS — Z789 Other specified health status: Secondary | ICD-10-CM

## 2018-04-01 DIAGNOSIS — E118 Type 2 diabetes mellitus with unspecified complications: Secondary | ICD-10-CM

## 2018-04-01 DIAGNOSIS — O0990 Supervision of high risk pregnancy, unspecified, unspecified trimester: Secondary | ICD-10-CM

## 2018-04-01 DIAGNOSIS — O09522 Supervision of elderly multigravida, second trimester: Secondary | ICD-10-CM

## 2018-04-01 MED ORDER — GLUCOSE BLOOD VI STRP: ORAL_STRIP | 12 refills | Status: DC

## 2018-04-01 MED ORDER — INSULIN NPH (HUMAN) (ISOPHANE) 100 UNIT/ML ~~LOC~~ SUSP: SUBCUTANEOUS | 3 refills | Status: DC

## 2018-04-01 MED ORDER — INSULIN ASPART 100 UNIT/ML ~~LOC~~ SOLN: 12.0000 [IU] | Freq: Three times a day (TID) | SUBCUTANEOUS | 12 refills | Status: DC

## 2018-04-01 NOTE — Progress Notes (Signed)
   PRENATAL VISIT NOTE  Subjective:  Chloe Perez is a 42 y.o. G2P0010 at [redacted]w[redacted]d being seen today for ongoing prenatal care.  She is currently monitored for the following issues for this high-risk pregnancy and has Oligomenorrhea; Type II diabetes mellitus ([place]); Infertility, female, secondary; PCOS (polycystic ovarian syndrome); Female hypogonadism; Sebaceous cyst; Supervision of high risk pregnancy, antepartum; Non-English speaking patient; Advanced maternal age in multigravida; and Diabetes mellitus in pregnancy, antepartum on their problem list.  Patient reports first time episode of heart palpitations this morning.  Contractions: Not present. Vag. Bleeding: None.  Movement: Present. Denies leaking of fluid.   The following portions of the patient's history were reviewed and updated as appropriate: allergies, current medications, past family history, past medical history, past social history, past surgical history and problem list. Problem list updated.  Objective:   Vitals:   04/01/18 0816  BP: 130/81  Pulse: 96  Weight: 157 lb (71.2 kg)    Fetal Status: Fetal Heart Rate (bpm): 150 Fundal Height: 24 cm Movement: Present     General:  Alert, oriented and cooperative. Patient is in no acute distress.  Skin: Skin is warm and dry. No rash noted.   Cardiovascular: Normal heart rate noted  Respiratory: Normal respiratory effort, no problems with respiration noted  Abdomen: Soft, gravid, appropriate for gestational age.  Pain/Pressure: Present     Pelvic: Cervical exam deferred        Extremities: Normal range of motion.  Edema: Trace  Mental Status: Normal mood and affect. Normal behavior. Normal judgment and thought content.   Assessment and Plan:  Pregnancy: G2P0010 at [redacted]w[redacted]d  1. Supervision of high risk pregnancy, antepartum Patient is doing well Advised to monitor symptoms and to stay hydrated Precautions reviewed  2. Pre-existing type 2 diabetes mellitus during  pregnancy, antepartum Patient did not bring CBG log but reports fasting 98-115 and pp 140-160 Discussed starting insulin Rx NPH 37 am/12 pm and novolog 12 with meal provided Patient to meet with diabetic educator this week for insulin teaching Growth ultrasound with health department on 5/13 - Referral to Nutrition and Diabetes Services  3. Multigravida of advanced maternal age in second trimester Declined genetic testing  4. Non-English speaking patient Spanish interpreter used   Preterm labor symptoms and general obstetric precautions including but not limited to vaginal bleeding, contractions, leaking of fluid and fetal movement were reviewed in detail with the patient. Please refer to After Visit Summary for other counseling recommendations.  Return in about 3 weeks (around 04/22/2018) for High risk.  No future appointments.  Mora Bellman, MD

## 2018-04-01 NOTE — Progress Notes (Signed)
Pt c/o tingling in fingers and legs only at bedtime x 1 mo. Pt also c/o heart palpitation this morning.

## 2018-04-04 ENCOUNTER — Encounter: Payer: Self-pay | Attending: Obstetrics and Gynecology | Admitting: Dietician

## 2018-04-04 DIAGNOSIS — O24119 Pre-existing diabetes mellitus, type 2, in pregnancy, unspecified trimester: Secondary | ICD-10-CM | POA: Insufficient documentation

## 2018-04-04 DIAGNOSIS — Z713 Dietary counseling and surveillance: Secondary | ICD-10-CM | POA: Insufficient documentation

## 2018-04-04 DIAGNOSIS — E119 Type 2 diabetes mellitus without complications: Secondary | ICD-10-CM | POA: Insufficient documentation

## 2018-04-04 DIAGNOSIS — Z3A00 Weeks of gestation of pregnancy not specified: Secondary | ICD-10-CM | POA: Insufficient documentation

## 2018-04-05 NOTE — Progress Notes (Signed)
Diabetes Self-Management Education  Visit Type: Follow-up  Appt. Start Time: 1415 Appt. End Time: 5409  04/05/2018  [name], identified by name and date of birth, is a 42 y.o. female with a diagnosis of Diabetes:  Existing Type 2 Diabetes during pregnancy.  Patient is accompanied by a friend and the interpretor  Drema Halon [phone] from [facility].  Her history also includes PCOS and infertility.  She is currently [redacted] weeks gestation.   Weight is 157 lbs per patient and does not know her pre pregnancy weight. Medications include Glimepiride and Metformin. She brings the Novolin N and Novolog and states that she needs to know how to give this and has not started this yet.  Conflicting information if she has syringes and needles at home.  Told her to call MD for prescriptions for these.  She also has left the Novolog vials in the hot car today for 2 hours and they are currently not refrigerated.  We watched a short video about giving insulin injections and in interpretor translated but at this point patient was overwhelmed.  Showed patient where to give the shots and the need to rotate the injection sites.  Reviewed symptoms of hypoglycemia and treatment.   She is going to work on getting pregnancy Medicad as she currently has been paying for all medication an supplies out of pocket. An appointment has been made for further instruction regarding giving insulin on Tuesday.  Patient lives with her husband.  She fries the tortillas at times but generally not meat.  She does not prefer to eat fruit and generally does not buy it as it spoils.  She does not eat vegetables as she does not know how to cook them.  Her friend with her today states that she will show her.  Her friend has 4 children and also plans to help patient with breastfeeding as needed.  Translator suggested that patient see a Science writer prior to the baby's birth.  She does not work outside of the home.    .    ASSESSMENT  Height 5' (1.524 m), weight 157 lb (71.2 kg), last menstrual period 08/28/2017. Body mass index is 30.66 kg/m.  Diabetes Self-Management Education - 04/05/18 0007      Visit Information   Visit Type  Follow-up      Psychosocial Assessment   Learning Readiness  Ready    What is the last grade level you completed in school?  10th grade      Pre-Education Assessment   Patient understands the diabetes disease and treatment process.  Needs Review    Patient understands incorporating nutritional management into lifestyle.  Needs Review    Patient undertands incorporating physical activity into lifestyle.  Needs Review    Patient understands using medications safely.  Needs Review    Patient understands monitoring blood glucose, interpreting and using results  Needs Review    Patient understands prevention, detection, and treatment of acute complications.  Needs Review    Patient understands prevention, detection, and treatment of chronic complications.  Needs Review    Patient understands how to develop strategies to address psychosocial issues.  Needs Review    Patient understands how to develop strategies to promote health/change behavior.  Needs Review      Complications   Fasting Blood glucose range (mg/dL)  70-129    Postprandial Blood glucose range (mg/dL)  70-129;130-179    Number of hypoglycemic episodes per month  0    Number of  hyperglycemic episodes per week  0      Dietary Intake   Breakfast  Coffee with 1 sugar and 2% milk, eggs, beans, chicken 3 corn tortillas and occasional sweet bread    Snack (morning)  none    Lunch  chicken or pork, beans, corn tortillas    Snack (afternoon)  none    Dinner  chicken or pork, beans, corn tortillas    Snack (evening)  none    Beverage(s)  water, coffee      Exercise   Exercise Type  ADL's      Patient Education   Previous Diabetes Education  Yes (please comment) 06/2017    Disease state   Explored patient's  options for treatment of their diabetes    Nutrition management   Role of diet in the treatment of diabetes and the relationship between the three main macronutrients and blood glucose level;Meal timing in regards to the patients' current diabetes medication.    Medications  Reviewed patients medication for diabetes, action, purpose, timing of dose and side effects.    Monitoring  Purpose and frequency of SMBG.;Identified appropriate SMBG and/or A1C goals.    Acute complications  Taught treatment of hypoglycemia - the 15 rule.    Preconception care  Reviewed with patient blood glucose goals with pregnancy      Individualized Goals (developed by patient)   Nutrition  Follow meal plan discussed    Physical Activity  Exercise 3-5 times per week;30 minutes per day as allowed by obstetritian    Medications  take my medication as prescribed    Monitoring   test my blood glucose as discussed    Reducing Risk  Other (comment);examine blood glucose patterns small frequent meals    Health Coping  discuss diabetes with (comment) MD,RD, CDE        Outcomes   Expected Outcomes  Demonstrated interest in learning. Expect positive outcomes    Future DMSE  Other (comment) 5 days    Program Status  Not Completed       Individualized Plan for Diabetes Self-Management Training:   Learning Objective:  Patient will have a greater understanding of diabetes self-management. Patient education plan is to attend individual and/or group sessions per assessed needs and concerns.   Plan:   There are no Patient Instructions on file for this visit.  Expected Outcomes:  Demonstrated interest in learning. Expect positive outcomes  Education material provided: Food label handouts, Meal plan card and My Plate, Gestational Diabetes Handout (all in Spanish)  If problems or questions, patient to contact team via:  Phone  Future DSME appointment: Other (comment)(5 days)

## 2018-04-09 ENCOUNTER — Encounter: Payer: Self-pay | Admitting: Nutrition

## 2018-04-09 DIAGNOSIS — O24119 Pre-existing diabetes mellitus, type 2, in pregnancy, unspecified trimester: Secondary | ICD-10-CM

## 2018-04-10 NOTE — Progress Notes (Signed)
Patient is here with her interpreter to discuss insulin injections.   We discussed the timing of the insulins and and why she needs to take this.  She reported good understanding of this. She was shown how to draw upand mix the two insulins before breakfast and she re demonstrated this correctly.  She was given a brochure in [place], on how to mix the insulins  She also re verbalized the correct insulin doses before each meal and at bedtime. Written instructions were given to her.   We also discussed low blood sugars--symptoms and treatment.  She reported good understanding of this.She was told to test her blood sugars before meals and at bedtime.  She was also told to contact the physician office if she has a low blood sugar.  She agreed to do this.  She had eaten breakfast, and was unable to take her first injection in the office, but reported good understanding of how to do this, and had no final questions.

## 2018-04-10 NOTE — Patient Instructions (Signed)
Please take 37 units of NPH (cloudy) insulin every morning, and 12u every evening. Take 12u of Novolog 10 min., before all meals. Test blood sugar before meals and at bedtime Carry 3 pieces of hard candy,  or 4 ounces of juice, or soda in case of a low blood sugar. Call doctor's office to notify them if you have a low blood sugar.

## 2018-04-16 ENCOUNTER — Telehealth: Payer: Self-pay | Admitting: Nutrition

## 2018-04-16 NOTE — Telephone Encounter (Signed)
Message left on my machine that patient does not want to take this insulin, and that there was a questions about the dosage. Message left on the machine that she is to take NPH: 37u in the AM and 12u in the PM, and also Novolog 12u  10 min. Before meals.

## 2018-04-23 ENCOUNTER — Encounter: Payer: Self-pay | Admitting: Obstetrics and Gynecology

## 2018-04-23 ENCOUNTER — Ambulatory Visit (INDEPENDENT_AMBULATORY_CARE_PROVIDER_SITE_OTHER): Payer: Self-pay | Admitting: Obstetrics and Gynecology

## 2018-04-23 VITALS — BP 125/78 | HR 90 | Wt 161.4 lb

## 2018-04-23 DIAGNOSIS — O24119 Pre-existing diabetes mellitus, type 2, in pregnancy, unspecified trimester: Secondary | ICD-10-CM

## 2018-04-23 DIAGNOSIS — O0990 Supervision of high risk pregnancy, unspecified, unspecified trimester: Secondary | ICD-10-CM

## 2018-04-23 DIAGNOSIS — Z789 Other specified health status: Secondary | ICD-10-CM

## 2018-04-23 DIAGNOSIS — O09523 Supervision of elderly multigravida, third trimester: Secondary | ICD-10-CM

## 2018-04-23 MED ORDER — INSULIN ASPART 100 UNIT/ML ~~LOC~~ SOLN: 12.0000 [IU] | Freq: Three times a day (TID) | SUBCUTANEOUS | 12 refills | Status: DC

## 2018-04-23 MED ORDER — INSULIN NPH (HUMAN) (ISOPHANE) 100 UNIT/ML ~~LOC~~ SUSP: SUBCUTANEOUS | 3 refills | Status: DC

## 2018-04-23 NOTE — Progress Notes (Signed)
   PRENATAL VISIT NOTE  Subjective:  Chloe Perez is a 42 y.o. G2P0010 at [redacted]w[redacted]d being seen today for ongoing prenatal care.  She is currently monitored for the following issues for this high-risk pregnancy and has Oligomenorrhea; Type II diabetes mellitus ([place]); Infertility, female, secondary; PCOS (polycystic ovarian syndrome); Female hypogonadism; Sebaceous cyst; Supervision of high risk pregnancy, antepartum; Non-English speaking patient; Advanced maternal age in multigravida; and Diabetes mellitus in pregnancy, antepartum on their problem list.  Patient reports no complaints.  Contractions: Not present. Vag. Bleeding: None.  Movement: Present. Denies leaking of fluid.   The following portions of the patient's history were reviewed and updated as appropriate: allergies, current medications, past family history, past medical history, past social history, past surgical history and problem list. Problem list updated.  Objective:   Vitals:   04/23/18 0817  BP: 125/78  Pulse: 90  Weight: 161 lb 6.4 oz (73.2 kg)    Fetal Status: Fetal Heart Rate (bpm): 145 Fundal Height: 31 cm Movement: Present     General:  Alert, oriented and cooperative. Patient is in no acute distress.  Skin: Skin is warm and dry. No rash noted.   Cardiovascular: Normal heart rate noted  Respiratory: Normal respiratory effort, no problems with respiration noted  Abdomen: Soft, gravid, appropriate for gestational age.  Pain/Pressure: Absent     Pelvic: Cervical exam deferred        Extremities: Normal range of motion.  Edema: Trace  Mental Status: Normal mood and affect. Normal behavior. Normal judgment and thought content.   Assessment and Plan:  Pregnancy: G2P0010 at [redacted]w[redacted]d  1. Supervision of high risk pregnancy, antepartum Patient is doing well  Third trimester labs today - CBC - HIV antibody - RPR  2. Pre-existing type 2 diabetes mellitus during pregnancy, antepartum Patient has been fearful of  insulin and thus recently started taking insulin last Thursday. She has not been check CBGs after meal but rather before meals. Her fasting CBGs are 70-90's Education provided Continue ASA Weekly BPP ordered starting at 28 weeks - US FETAL BPP WO NON STRESS; Future  3. Multigravida of advanced maternal age in third trimester Declined genetic testing  4. Non-English speaking patient Spanish interpreter present during encounter  Preterm labor symptoms and general obstetric precautions including but not limited to vaginal bleeding, contractions, leaking of fluid and fetal movement were reviewed in detail with the patient. Please refer to After Visit Summary for other counseling recommendations.  Return in about 2 weeks (around 05/07/2018) for ROB.  No future appointments.  Mora Bellman, MD

## 2018-04-23 NOTE — Progress Notes (Signed)
[facility] told her that the Metformin is not safe.

## 2018-04-24 LAB — CBC
Hematocrit: 38.1 % (ref 34.0–46.6)
Hemoglobin: 12.1 g/dL (ref 11.1–15.9)
MCH: 28.5 pg (ref 26.6–33.0)
MCHC: 31.8 g/dL (ref 31.5–35.7)
MCV: 90 fL (ref 79–97)
PLATELETS: 304 10*3/uL (ref 150–450)
RBC: 4.25 x10E6/uL (ref 3.77–5.28)
RDW: 13.3 % (ref 12.3–15.4)
WBC: 7.8 10*3/uL (ref 3.4–10.8)

## 2018-04-24 LAB — HIV ANTIBODY (ROUTINE TESTING W REFLEX): HIV Screen 4th Generation wRfx: NONREACTIVE

## 2018-04-24 LAB — RPR: RPR: NONREACTIVE

## 2018-04-30 ENCOUNTER — Ambulatory Visit: Payer: Self-pay

## 2018-04-30 ENCOUNTER — Ambulatory Visit (INDEPENDENT_AMBULATORY_CARE_PROVIDER_SITE_OTHER): Payer: Self-pay | Admitting: *Deleted

## 2018-04-30 VITALS — BP 122/74 | HR 92 | Wt 164.0 lb

## 2018-04-30 DIAGNOSIS — O24119 Pre-existing diabetes mellitus, type 2, in pregnancy, unspecified trimester: Secondary | ICD-10-CM

## 2018-04-30 NOTE — Progress Notes (Signed)

## 2018-05-03 ENCOUNTER — Inpatient Hospital Stay (HOSPITAL_COMMUNITY)
Admission: AD | Admit: 2018-05-03 | Discharge: 2018-05-03 | Disposition: A | Discharge status: Home / Self Care | Payer: Self-pay | Source: Ambulatory Visit | Attending: Obstetrics & Gynecology | Admitting: Obstetrics & Gynecology

## 2018-05-03 ENCOUNTER — Encounter (HOSPITAL_COMMUNITY): Payer: Self-pay | Admitting: *Deleted

## 2018-05-03 DIAGNOSIS — O409XX0 Polyhydramnios, unspecified trimester, not applicable or unspecified: Secondary | ICD-10-CM | POA: Insufficient documentation

## 2018-05-03 DIAGNOSIS — O403XX0 Polyhydramnios, third trimester, not applicable or unspecified: Secondary | ICD-10-CM | POA: Insufficient documentation

## 2018-05-03 DIAGNOSIS — Z87891 Personal history of nicotine dependence: Secondary | ICD-10-CM | POA: Insufficient documentation

## 2018-05-03 DIAGNOSIS — Z7982 Long term (current) use of aspirin: Secondary | ICD-10-CM | POA: Insufficient documentation

## 2018-05-03 DIAGNOSIS — O4703 False labor before 37 completed weeks of gestation, third trimester: Secondary | ICD-10-CM

## 2018-05-03 DIAGNOSIS — Z3A28 28 weeks gestation of pregnancy: Secondary | ICD-10-CM | POA: Insufficient documentation

## 2018-05-03 LAB — CBC
HCT: 34.2 % — ABNORMAL LOW (ref 36.0–46.0)
HEMOGLOBIN: 11.8 g/dL — AB (ref 12.0–15.0)
MCH: 28.7 pg (ref 26.0–34.0)
MCHC: 34.5 g/dL (ref 30.0–36.0)
MCV: 83.2 fL (ref 78.0–100.0)
Platelets: 278 10*3/uL (ref 150–400)
RBC: 4.11 MIL/uL (ref 3.87–5.11)
RDW: 12.6 % (ref 11.5–15.5)
WBC: 10.7 10*3/uL — ABNORMAL HIGH (ref 4.0–10.5)

## 2018-05-03 LAB — COMPREHENSIVE METABOLIC PANEL
ALK PHOS: 82 U/L (ref 38–126)
ALT: 18 U/L (ref 14–54)
AST: 24 U/L (ref 15–41)
Albumin: 3.1 g/dL — ABNORMAL LOW (ref 3.5–5.0)
Anion gap: 11 (ref 5–15)
BUN: 11 mg/dL (ref 6–20)
CALCIUM: 9.3 mg/dL (ref 8.9–10.3)
CO2: 17 mmol/L — AB (ref 22–32)
CREATININE: 0.41 mg/dL — AB (ref 0.44–1.00)
Chloride: 106 mmol/L (ref 101–111)
Glucose, Bld: 172 mg/dL — ABNORMAL HIGH (ref 65–99)
Potassium: 4.1 mmol/L (ref 3.5–5.1)
SODIUM: 134 mmol/L — AB (ref 135–145)
Total Bilirubin: 0.5 mg/dL (ref 0.3–1.2)
Total Protein: 6.3 g/dL — ABNORMAL LOW (ref 6.5–8.1)

## 2018-05-03 LAB — URINALYSIS, ROUTINE W REFLEX MICROSCOPIC
Bilirubin Urine: NEGATIVE
Glucose, UA: >=500 mg/dL — AB
Ketones, ur: 5 mg/dL — AB
Leukocytes, UA: NEGATIVE
Nitrite: NEGATIVE
PH: 6 (ref 5.0–8.0)
Protein, ur: NEGATIVE mg/dL
SPECIFIC GRAVITY, URINE: 1.003 — AB (ref 1.005–1.030)

## 2018-05-03 LAB — WET PREP, GENITAL
Clue Cells Wet Prep HPF POC: NONE SEEN
Sperm: NONE SEEN
Trich, Wet Prep: NONE SEEN
Yeast Wet Prep HPF POC: NONE SEEN

## 2018-05-03 MED ORDER — NIFEDIPINE 10 MG PO CAPS
10.0000 mg | ORAL_CAPSULE | ORAL | Status: AC
  Administered 2018-05-03 (×3): 10 mg via ORAL
  Filled 2018-05-03 (×3): qty 1

## 2018-05-03 NOTE — MAU Note (Signed)
Constant abdominal pain since 5pm, no bleeding or leaking of fluid, positive FM

## 2018-05-03 NOTE — MAU Provider Note (Addendum)
History     CSN: [id number]  Arrival date and time: 05/03/18 1858   First Provider Initiated Contact with Patient 05/03/18 1930      Chief Complaint  Patient presents with  . Abdominal Pain   G2P0101 @28 .5 wks here with abdominal pain. Pain started around 3pm today. Describes as constant, fullness, in upper and lower abdomen. Rates pain 5/10. Has not treated symptoms. Denies VB, LOF, or ctx. Good FM today. Denies urinary sx. No N/V. No fevers. Reports intaking 2 bottles of water today. Her pregnancy is complicated by E1DE, AMA, and Polyhydramnios.    OB History    Gravida  2   Para  0   Term      Preterm      AB  1   Living  0     SAB  1   TAB      Ectopic      Multiple      Live Births              Past Medical History:  Diagnosis Date  . Diabetes mellitus without complication ([place])    DM type 2 metformin   . Missed ab     Past Surgical History:  Procedure Laterality Date  . uterin polyps      Family History  Problem Relation Age of Onset  . Diabetes Other   . Cancer Other   . Hypertension Other   . Heart disease Other   . Diabetes Mother   . Diabetes Father     Social History   Tobacco Use  . Smoking status: Former Research scientist (life sciences)  . Smokeless tobacco: Never Used  Substance Use Topics  . Alcohol use: Yes    Comment: before pregnancy  . Drug use: No    Allergies: No Known Allergies  Medications Prior to Admission  Medication Sig Dispense Refill Last Dose  . aspirin 81 MG chewable tablet Chew 1 tablet (81 mg total) by mouth daily. 30 tablet 12 Taking  . aspirin EC 81 MG tablet Take 1 tablet (81 mg total) by mouth daily. Take after 12 weeks for prevention of preeclampsia later in pregnancy 300 tablet 2 Taking  . glimepiride (AMARYL) 4 MG tablet Take 4 mg by mouth daily with breakfast.   Taking  . glucose blood (ACCU-CHEK GUIDE) test strip Use as instructed 100 each 12   . insulin aspart (NOVOLOG) 100 UNIT/ML injection Inject 12 Units into  the skin 3 (three) times daily before meals. 10 mL 12   . insulin NPH Human (HUMULIN N,NOVOLIN N) 100 UNIT/ML injection Inject subcutaneously 31 units in morning and 12 units at bedtime 10 mL 3   . Lancets (ACCU-CHEK SOFT TOUCH) lancets Use as instructed 100 each 12 Taking  . metFORMIN (GLUCOPHAGE) 1000 MG tablet Take 1 tablet (1,000 mg total) by mouth 2 (two) times daily with a meal. 180 tablet 3 Taking  . Prenat-Fe Poly-Methfol-FA-DHA (VITAFOL ULTRA) 29-0.6-0.4-200 MG CAPS Take 1 tablet by mouth daily. 30 capsule 12 Taking  . Prenatal Vit-Fe Fumarate-FA (MULTIVITAMIN-PRENATAL) 27-0.8 MG TABS tablet Take 1 tablet by mouth daily at 12 noon.   Taking    Review of Systems  Constitutional: Negative for fever.  Gastrointestinal: Positive for abdominal pain. Negative for constipation, diarrhea, nausea and vomiting.  Genitourinary: Negative for dysuria, hematuria, urgency, vaginal bleeding and vaginal discharge.   Physical Exam   Blood pressure 134/74, pulse 93, temperature 97.7 F (36.5 C), temperature source Oral, resp. rate 19, weight 165  lb (74.8 kg), last menstrual period 08/28/2017.  Physical Exam  Nursing note and vitals reviewed. Constitutional: She is oriented to person, place, and time. She appears well-developed and well-nourished.  HENT:  Head: Normocephalic and atraumatic.  Neck: Normal range of motion.  Cardiovascular: Normal rate.  Respiratory: Effort normal. No respiratory distress.  GI: Soft. She exhibits no distension. There is no tenderness.  gravid  Genitourinary:  Genitourinary Comments: SVE: closed/thick  Musculoskeletal: Normal range of motion.  Neurological: She is alert and oriented to person, place, and time.  Skin: Skin is warm and dry.  Psychiatric: She has a normal mood and affect.  EFM: 150 bpm, mod variability, + accels, no decels Toco: 2-3  Results for orders placed or performed during the hospital encounter of 05/03/18 (from the past 24 hour(s))   Urinalysis, Routine w reflex microscopic     Status: Abnormal   Collection Time: 05/03/18  7:01 PM  Result Value Ref Range   Color, Urine STRAW (A) YELLOW   APPearance CLEAR CLEAR   Specific Gravity, Urine 1.003 (L) 1.005 - 1.030   pH 6.0 5.0 - 8.0   Glucose, UA >=500 (A) NEGATIVE mg/dL   Hgb urine dipstick SMALL (A) NEGATIVE   Bilirubin Urine NEGATIVE NEGATIVE   Ketones, ur 5 (A) NEGATIVE mg/dL   Protein, ur NEGATIVE NEGATIVE mg/dL   Nitrite NEGATIVE NEGATIVE   Leukocytes, UA NEGATIVE NEGATIVE   RBC / HPF 0-5 0 - 5 RBC/hpf   WBC, UA 0-5 0 - 5 WBC/hpf   Bacteria, UA RARE (A) NONE SEEN   Squamous Epithelial / LPF 0-5 0 - 5  Wet prep, genital     Status: Abnormal   Collection Time: 05/03/18  7:53 PM  Result Value Ref Range   Yeast Wet Prep HPF POC NONE SEEN NONE SEEN   Trich, Wet Prep NONE SEEN NONE SEEN   Clue Cells Wet Prep HPF POC NONE SEEN NONE SEEN   WBC, Wet Prep HPF POC FEW (A) NONE SEEN   Sperm NONE SEEN   CBC     Status: Abnormal   Collection Time: 05/03/18  8:06 PM  Result Value Ref Range   WBC 10.7 (H) 4.0 - 10.5 K/uL   RBC 4.11 3.87 - 5.11 MIL/uL   Hemoglobin 11.8 (L) 12.0 - 15.0 g/dL   HCT 34.2 (L) 36.0 - 46.0 %   MCV 83.2 78.0 - 100.0 fL   MCH 28.7 26.0 - 34.0 pg   MCHC 34.5 30.0 - 36.0 g/dL   RDW 12.6 11.5 - 15.5 %   Platelets 278 150 - 400 K/uL  Comprehensive metabolic panel     Status: Abnormal   Collection Time: 05/03/18  8:06 PM  Result Value Ref Range   Sodium 134 (L) 135 - 145 mmol/L   Potassium 4.1 3.5 - 5.1 mmol/L   Chloride 106 101 - 111 mmol/L   CO2 17 (L) 22 - 32 mmol/L   Glucose, Bld 172 (H) 65 - 99 mg/dL   BUN 11 6 - 20 mg/dL   Creatinine, Ser 0.41 (L) 0.44 - 1.00 mg/dL   Calcium 9.3 8.9 - 10.3 mg/dL   Total Protein 6.3 (L) 6.5 - 8.1 g/dL   Albumin 3.1 (L) 3.5 - 5.0 g/dL   AST 24 15 - 41 U/L   ALT 18 14 - 54 U/L   Alkaline Phosphatase 82 38 - 126 U/L   Total Bilirubin 0.5 0.3 - 1.2 mg/dL   GFR calc non Af Amer >60 >  60 mL/min   GFR  calc Af Amer >60 >60 mL/min   Anion gap 11 5 - 15   MAU Course  Procedures Procardia Po hydration  MDM Labs ordered and reviewed.  Transfer of care given to [facility] [place]. Julianne Handler, CNM  05/03/2018 9:02 PM   Cervix unchanged after observation & ctx spaced out. Pt reports pain decreased to 3/10 and feels better. Assessment and Plan  A: 1. Preterm uterine contractions in third trimester, antepartum   2. [redacted] weeks gestation of pregnancy   3. Polyhydramnios affecting pregnancy in third trimester    P: Discharge home Discussed reasons to return to MAU  Keep f/u with OB  Jorje Guild, NP

## 2018-05-03 NOTE — Discharge Instructions (Signed)
Polihidramnios (Polyhydramnios) Cuando una mujer queda [place], se forma un saco alrededor del vulo fertilizado (embrin), que luego se convierte en el beb en desarrollo (feto). Este saco se llama saco amnitico. El saco amnitico se llena de lquido. Y va aumentando su tamao a medida que Sports administrator. Cuando hay mucho lquido dentro del saco, se denomina polihidramnios. Se deben investigar las anomalas congnitas en todos los bebs que nacen con polihidramnios. El lquido amnitico protege al beb y sirve como amortiguacin. Tambin suministra lquido al beb y es fundamental para un desarrollo normal. El beb respira este lquido, lo traga y lo lleva a los pulmones. Esto ayuda a fomentar un desarrollo normal de los pulmones y del tracto gastrointestinal. El lquido amnitico tambin ayuda a que el beb se mueva dentro del tero y que los huesos y los msculos se Corporate investment banker. CAUSAS  Diabetes mellitus.  Cuando hay sndrome de [place], anomalas fetales del tracto intestinal y anencefalia (un feto sin cerebro), el feto puede tener problemas para tragar el lquido amnitico.  Un gemelo que transmite (transfunde) su sangre al otro gemelo (sndrome de transfusin de gemelo a gemelo).  Enfermedad cardaca o de otro tipo [employer].  Enfermedades renales.  Tumor (corioangioma) en la placenta. SNTOMAS  El tero se agranda ms all del tamao normal para una etapa especfica del embarazo.  La madre siente mayor presin y Tree surgeon que lo previsto.  La madre nota un agrandamiento repentino e imprevisto del abdomen. DIAGNSTICO  Al medirla, su mdico observa que el tamao del tero es mayor a lo que corresponde a su etapa del Media planner.  Entonces, se utiliza una ecografa (abdominal o vaginal) para ver si est embarazada de gemelos o de ms fetos, medir el crecimiento del beb, descubrir defectos congnitos y Psychologist, sport and exercise cantidad de lquido en el saco amnitico.  ndice  de lquido amnitico (ILA). El ILA mide la cantidad de lquido en el saco amnitico en cuatro reas diferentes. Si mide ms de 24centmetros, tiene polihidramnios. TRATAMIENTO  Extraer parte del lquido del saco amnitico.  Administrar medicamentos para reducir los lquidos en su cuerpo.  Dejar de consumir sal o alimentos salados para evitar la acumulacin de lquidos en el cuerpo (retencin).  Si su mdico cree que usted tiene polihidramnios, es probable que requiera otras pruebas. Permanecer en observacin durante el resto del [place]. INSTRUCCIONES PARA EL CUIDADO EN EL HOGAR  Cumpla con todas sus citas prenatales. Siga las recomendaciones de su mdico.  No consuma mucha sal ni alimentos salados.  Si tiene diabetes, mantngala controlada.  Si tiene una enfermedad cardaca o renal, haga un tratamiento segn las indicaciones de su mdico. SOLICITE ATENCIN MDICA SI:  Cree que su tero ha crecido muy rpido en muy poco tiempo.  Siente mucha presin en la parte inferior del abdomen (pelvis) y ms molestias que lo previsto. SOLICITE ATENCIN MDICA DE INMEDIATO SI:  Tiene una prdida de lquido importante o sale lquido a chorros por Geneticist, molecular.  Siente que el beb dej de moverse.  No siente las patadas habituales del beb.  Tiene problemas para controlar la diabetes.  Tiene problemas cardacos o renales. Esta informacin no tiene Marine scientist el consejo del mdico. Asegrese de hacerle al mdico cualquier pregunta que tenga. Document Released: 02/09/2009 Document Revised: 09/03/2013 Document Reviewed: 06/12/2013 Elsevier Interactive Patient Education  2018 [place] parto y Geophysical data processor de parto prematuros (Preterm Labor and Birth Information) La duracin de un Media planner  normal es de 39 a 41semanas. Se llama trabajo de parto prematuro cuando se inicia antes de las 37semanas de [place]. [place]? Existen mayores probabilidades de trabajo de parto prematuro en mujeres con las siguientes caractersticas:  Tienen ciertas infecciones durante el embarazo, como infeccin de vejiga, infeccin de transmisin sexual o infeccin en el tero (corioamnionitis).  Tienen el cuello del tero ms corto que lo normal.  Tuvieron trabajo de parto prematuro anteriormente.  Se sometieron a una ciruga en el cuello del tero.  Son menores de 17aos o mayores de 62aos de edad.  Son afroamericanas.  Estn embarazadas de [employer] o de varios bebs (gestacin mltiple).  Consumen drogas o fuman mientras estn embarazadas.  No aumentan de peso lo suficiente durante el [employer] Corporation.  Se embarazan poco despus de SUPERVALU INC. CULES SON LOS SNTOMAS DEL [place]? Los sntomas del trabajo de parto prematuro incluyen lo siguiente:  Marketing executive similares a los que ocurren durante el perodo menstrual. Los calambres pueden presentarse con diarrea.  Dolor en el abdomen o en la parte inferior de la espalda.  Contracciones uterinas regulares que se pueden sentir como una presin en el abdomen.  Una sensacin de mayor presin en la pelvis.  Aumento de la secrecin de moco acuoso o sanguinolento en la vagina.  Rotura de bolsa (rotura de saco amnitico). POR QU ES IMPORTANTE RECONOCER LOS SIGNOS DEL [place]? Es Glass blower/designer los signos del trabajo de parto prematuro porque los bebs que nacen de forma prematura pueden no estar completamente desarrollados. Por lo tanto, pueden correr mayor riesgo de lo siguiente:  Problemas cardacos y pulmonares a Barrister's clerk (crnicos).  Inmediatamente despus del parto, dificultades para regular los sistemas corporales, que incluyen glucemia, temperatura corporal, frecuencia cardaca y frecuencia respiratoria.  Hemorragia cerebral.  Parlisis cerebral.  Dificultades en el  aprendizaje.  [place]uerte. Estos riesgos son [employer] para bebs que nacen antes de las 34semanas de [place]. [place]? El tratamiento depende del tiempo de su [place], su afeccin y la salud de su beb. Puede incluir lo siguiente:  Tener un punto (sutura) en el cuello del tero para evitar que este se abra demasiado pronto (cerclaje).  Tomar medicamentos, por ejemplo: ? Medicamentos hormonales. Estos se pueden administrar de forma temprana en el embarazo para ayudar a Comptroller. ? [place] contracciones. ? Medicamentos que ayudan a [employer] del beb. Estos se pueden recetar si el riesgo de parto es [place]. ? Medicamentos para evitar que el beb desarrolle parlisis cerebral. Si el trabajo de parto de inicia antes de las 34semanas de [place], es posible que deba hospitalizarse. QU DEBO HACER SI CREO QUE ESTOY EN TRABAJO DE [place]? Si cree que est iniciando trabajo de parto prematuro, llame al mdico de inmediato. [place] DE PARTO PREMATURO EN FUTUROS EMBARAZOS? Para aumentar las probabilidades de tener un embarazo a trmino, Dance movement psychotherapist en cuenta lo siguiente:  No consuma ningn producto que contenga tabaco, lo que incluye cigarrillos, tabaco de Higher education careers adviser y Psychologist, sport and exercise. Si necesita ayuda para dejar de fumar, consulte al mdico.  No consuma drogas ni medicamentos que no sean recetados Solicitor.  Hable con el mdico antes de tomar suplementos a base de hierbas aunque los [employer].  Asegrese de llegar a un peso Tax adviser.  Tenga cuidado con las  infecciones. Si cree que puede tener una infeccin, consulte al mdico para que la revisen.  Asegrese de informarle al mdico si ha tenido trabajo de parto prematuro antes. Esta informacin no tiene Marine scientist el consejo del mdico. Asegrese de hacerle al mdico  cualquier pregunta que tenga. Document Released: 02/20/2008 Document Revised: 07/16/2013 Document Reviewed: 04/05/2016 Elsevier Interactive Patient Education  [employer].

## 2018-05-06 LAB — GC/CHLAMYDIA PROBE AMP (~~LOC~~) NOT AT ARMC
Chlamydia: NEGATIVE
Neisseria Gonorrhea: NEGATIVE

## 2018-05-07 ENCOUNTER — Ambulatory Visit (INDEPENDENT_AMBULATORY_CARE_PROVIDER_SITE_OTHER): Payer: Self-pay | Admitting: Obstetrics & Gynecology

## 2018-05-07 ENCOUNTER — Ambulatory Visit: Payer: Self-pay

## 2018-05-07 ENCOUNTER — Ambulatory Visit (INDEPENDENT_AMBULATORY_CARE_PROVIDER_SITE_OTHER): Payer: Self-pay | Admitting: *Deleted

## 2018-05-07 VITALS — BP 111/72 | HR 85 | Wt 162.3 lb

## 2018-05-07 VITALS — BP 123/71 | HR 88 | Wt 163.1 lb

## 2018-05-07 DIAGNOSIS — O24119 Pre-existing diabetes mellitus, type 2, in pregnancy, unspecified trimester: Secondary | ICD-10-CM

## 2018-05-07 DIAGNOSIS — O403XX0 Polyhydramnios, third trimester, not applicable or unspecified: Secondary | ICD-10-CM

## 2018-05-07 DIAGNOSIS — O409XX0 Polyhydramnios, unspecified trimester, not applicable or unspecified: Secondary | ICD-10-CM

## 2018-05-07 DIAGNOSIS — O0993 Supervision of high risk pregnancy, unspecified, third trimester: Secondary | ICD-10-CM

## 2018-05-07 DIAGNOSIS — O0990 Supervision of high risk pregnancy, unspecified, unspecified trimester: Secondary | ICD-10-CM

## 2018-05-07 DIAGNOSIS — O24113 Pre-existing diabetes mellitus, type 2, in pregnancy, third trimester: Secondary | ICD-10-CM

## 2018-05-07 NOTE — Progress Notes (Signed)

## 2018-05-07 NOTE — Progress Notes (Signed)
   PRENATAL VISIT NOTE  Subjective:  Chloe Perez is a 42 y.o. G2P0010 at [redacted]w[redacted]d being seen today for ongoing prenatal care.  She is currently monitored for the following issues for this high-risk pregnancy and has Oligomenorrhea; Type II diabetes mellitus ([place]); Infertility, female, secondary; PCOS (polycystic ovarian syndrome); Female hypogonadism; Sebaceous cyst; Supervision of high risk pregnancy, antepartum; Non-English speaking patient; Advanced maternal age in multigravida; Diabetes mellitus in pregnancy, antepartum; and Polyhydramnios affecting pregnancy on their problem list.  Patient reports occasional contractions.  Contractions: Irregular. Vag. Bleeding: None.  Movement: Present. Denies leaking of fluid.   The following portions of the patient's history were reviewed and updated as appropriate: allergies, current medications, past family history, past medical history, past social history, past surgical history and problem list. Problem list updated.  Objective:   Vitals:   05/07/18 0831  BP: 111/72  Pulse: 85  Weight: 162 lb 4.8 oz (73.6 kg)    Fetal Status:     Movement: Present     General:  Alert, oriented and cooperative. Patient is in no acute distress.  Skin: Skin is warm and dry. No rash noted.   Cardiovascular: Normal heart rate noted  Respiratory: Normal respiratory effort, no problems with respiration noted  Abdomen: Soft, gravid, appropriate for gestational age.  Pain/Pressure: Present     Pelvic: Cervical exam deferred        Extremities: Normal range of motion.  Edema: None  Mental Status: Normal mood and affect. Normal behavior. Normal judgment and thought content.   Assessment and Plan:  Pregnancy: G2P0010 at [redacted]w[redacted]d  1. Polyhydramnios affecting pregnancy [place] [facility] today  2. Pre-existing type 2 diabetes mellitus during pregnancy, antepartum Needs f/u growth - [place] MFM OB FOLLOW UP; Future  3. Supervision of high risk pregnancy,  antepartum States BG values 80-90's  Preterm labor symptoms and general obstetric precautions including but not limited to vaginal bleeding, contractions, leaking of fluid and fetal movement were reviewed in detail with the patient. Please refer to After Visit Summary for other counseling recommendations.  Return in about 2 weeks (around 05/21/2018).  Future Appointments  Date Time Provider [place]  05/07/2018 10:15 AM WOC-WOCA NST WOC-WOCA WOC  05/14/2018 10:15 AM WOC-WOCA NST WOC-WOCA WOC  05/21/2018 10:15 AM WOC-WOCA NST WOC-WOCA WOC  05/28/2018 10:15 AM WOC-WOCA NST WOC-WOCA WOC  06/04/2018 10:15 AM WOC-WOCA NST WOC-WOCA WOC  06/11/2018 10:15 AM WOC-WOCA NST WOC-WOCA WOC  06/18/2018 10:15 AM WOC-WOCA NST WOC-WOCA WOC  06/25/2018 10:15 AM WOC-WOCA NST WOC-WOCA WOC  07/02/2018 10:15 AM WOC-WOCA NST WOC-WOCA WOC  07/09/2018 10:15 AM WOC-WOCA NST WOC-WOCA WOC  07/16/2018 10:15 AM WOC-WOCA NST WOC-WOCA WOC    Emeterio Reeve, MD

## 2018-05-14 ENCOUNTER — Ambulatory Visit (INDEPENDENT_AMBULATORY_CARE_PROVIDER_SITE_OTHER): Payer: Self-pay | Admitting: *Deleted

## 2018-05-14 ENCOUNTER — Ambulatory Visit: Payer: Self-pay

## 2018-05-14 VITALS — BP 118/76 | HR 86 | Wt 164.5 lb

## 2018-05-14 DIAGNOSIS — O409XX0 Polyhydramnios, unspecified trimester, not applicable or unspecified: Secondary | ICD-10-CM

## 2018-05-14 DIAGNOSIS — O403XX0 Polyhydramnios, third trimester, not applicable or unspecified: Secondary | ICD-10-CM

## 2018-05-14 NOTE — Progress Notes (Signed)
Pt informed that the ultrasound is considered a limited OB ultrasound and is not intended to be a complete ultrasound exam.  Patient also informed that the ultrasound is not being completed with the intent of assessing for fetal or placental anomalies or any pelvic abnormalities.  Explained that the purpose of today's ultrasound is to assess for presentation, BPP and amniotic fluid volume.  Patient acknowledges the purpose of the exam and the limitations of the study.    NST performed due to BPP 6/8, 0-breathing

## 2018-05-21 ENCOUNTER — Ambulatory Visit (INDEPENDENT_AMBULATORY_CARE_PROVIDER_SITE_OTHER): Payer: Self-pay | Admitting: Obstetrics and Gynecology

## 2018-05-21 ENCOUNTER — Encounter: Payer: Self-pay | Admitting: Obstetrics and Gynecology

## 2018-05-21 ENCOUNTER — Other Ambulatory Visit: Payer: Self-pay

## 2018-05-21 ENCOUNTER — Ambulatory Visit (HOSPITAL_COMMUNITY)
Admission: RE | Admit: 2018-05-21 | Discharge: 2018-05-21 | Disposition: A | Discharge status: Home / Self Care | Payer: Self-pay | Source: Ambulatory Visit | Attending: Obstetrics & Gynecology | Admitting: Obstetrics & Gynecology

## 2018-05-21 ENCOUNTER — Encounter (HOSPITAL_COMMUNITY): Payer: Self-pay

## 2018-05-21 ENCOUNTER — Other Ambulatory Visit (HOSPITAL_COMMUNITY): Payer: Self-pay | Admitting: *Deleted

## 2018-05-21 ENCOUNTER — Other Ambulatory Visit: Payer: Self-pay | Admitting: Obstetrics & Gynecology

## 2018-05-21 VITALS — BP 127/78 | HR 78 | Wt 166.2 lb

## 2018-05-21 DIAGNOSIS — Z3A31 31 weeks gestation of pregnancy: Secondary | ICD-10-CM | POA: Insufficient documentation

## 2018-05-21 DIAGNOSIS — O09523 Supervision of elderly multigravida, third trimester: Secondary | ICD-10-CM | POA: Insufficient documentation

## 2018-05-21 DIAGNOSIS — O409XX0 Polyhydramnios, unspecified trimester, not applicable or unspecified: Secondary | ICD-10-CM

## 2018-05-21 DIAGNOSIS — O24119 Pre-existing diabetes mellitus, type 2, in pregnancy, unspecified trimester: Secondary | ICD-10-CM

## 2018-05-21 DIAGNOSIS — Z362 Encounter for other antenatal screening follow-up: Secondary | ICD-10-CM

## 2018-05-21 DIAGNOSIS — O24113 Pre-existing diabetes mellitus, type 2, in pregnancy, third trimester: Secondary | ICD-10-CM | POA: Insufficient documentation

## 2018-05-21 DIAGNOSIS — Z789 Other specified health status: Secondary | ICD-10-CM

## 2018-05-21 DIAGNOSIS — O0990 Supervision of high risk pregnancy, unspecified, unspecified trimester: Secondary | ICD-10-CM

## 2018-05-21 DIAGNOSIS — O403XX0 Polyhydramnios, third trimester, not applicable or unspecified: Secondary | ICD-10-CM

## 2018-05-21 NOTE — Progress Notes (Signed)
Pt c/o sinus and throat pain with greenish drainage from nostrils x 1 day - Temp 97.1. BPP 8/8/ completed today.

## 2018-05-21 NOTE — Addendum Note (Signed)
Encounter addended by: Novella Rob, RT on: 05/21/2018 10:26 AM  Actions taken: Imaging Exam ended

## 2018-05-21 NOTE — Patient Instructions (Addendum)
For colds and allergies  Any anti-histamine including benadryl, allegra, claritin, etc.  Mucinex  Robitussin  For Reflux/heartburn  Pepcid Zantac Tums Prilosec Prevacid  For yeast infections  Monistat  For constipation  Colace  For minor aches and pains  Tylenol-do not take more than 4000mg  in 24 hours. Therma-care or like heat packs       Deciding about Circumcision in Baby Boys  (The Basics)  What is circumcision?  Circumcision is a surgery that removes the skin that covers the tip of the penis, called the "foreskin" Circumcision is usually done when a boy is between 62 and 56 days old. In the [place], circumcision is common. In some other countries, fewer boys are circumcised. Circumcision is a common tradition in some religions.  Should I have my baby boy circumcised?  There is no easy answer. Circumcision has some benefits. But it also has risks. After talking with your doctor, you will have to decide for yourself what is right for your family.  What are the benefits of circumcision?  Circumcised boys seem to have slightly lower rates of: ?Urinary tract infections ?Swelling of the opening at the tip of the penis Circumcised men seem to have slightly lower rates of: ?Urinary tract infections ?Swelling of the opening at the tip of the penis ?Penis cancer ?HIV and other infections that you catch during sex ?Cervical cancer in the women they have sex with Even so, in the [place], the risks of these problems are small - even in boys and men who have not been circumcised. Plus, boys and men who are not circumcised can reduce these extra risks by: ?Cleaning their penis well ?Using condoms during sex  What are the risks of circumcision?  Risks include: ?Bleeding or infection from the surgery ?Damage to or amputation of the penis ?A chance that the doctor will cut off too much or not enough of the foreskin ?A chance that sex won't feel as good  later in life Only about 1 out of every 200 circumcisions leads to problems. There is also a chance that your health insurance won't pay for circumcision.  How is circumcision done in baby boys?  First, the baby gets medicine for pain relief. This might be a cream on the skin or a shot into the base of the penis. Next, the doctor cleans the baby's penis well. Then he or she uses special tools to cut off the foreskin. Finally, the doctor wraps a bandage (called gauze) around the baby's penis. If you have your baby circumcised, his doctor or nurse will give you instructions on how to care for him after the surgery. It is important that you follow those instructions carefully.

## 2018-05-21 NOTE — Progress Notes (Signed)
   PRENATAL VISIT NOTE  Subjective:  Chloe Perez is a 42 y.o. G2P0010 at [redacted]w[redacted]d being seen today for ongoing prenatal care.  She is currently monitored for the following issues for this high-risk pregnancy and has Oligomenorrhea; Type II diabetes mellitus ([place]); Infertility, female, secondary; PCOS (polycystic ovarian syndrome); Female hypogonadism; Sebaceous cyst; Supervision of high risk pregnancy, antepartum; Non-English speaking patient; Advanced maternal age in multigravida; Diabetes mellitus in pregnancy, antepartum; and Polyhydramnios affecting pregnancy on their problem list.  Patient reports occasional contractions.  Contractions: Irregular. Vag. Bleeding: None.  Movement: Present. Denies leaking of fluid.   The following portions of the patient's history were reviewed and updated as appropriate: allergies, current medications, past family history, past medical history, past social history, past surgical history and problem list. Problem list updated.  Objective:   Vitals:   05/21/18 1043  BP: 127/78  Pulse: 78  Weight: 166 lb 3.2 oz (75.4 kg)    Fetal Status: Fetal Heart Rate (bpm): 160   Movement: Present     General:  Alert, oriented and cooperative. Patient is in no acute distress.  Skin: Skin is warm and dry. No rash noted.   Cardiovascular: Normal heart rate noted  Respiratory: Normal respiratory effort, no problems with respiration noted  Abdomen: Soft, gravid, appropriate for gestational age.  Pain/Pressure: Present     Pelvic: Cervical exam deferred        Extremities: Normal range of motion.  Edema: Trace  Mental Status: Normal mood and affect. Normal behavior. Normal judgment and thought content.   Assessment and Plan:  Pregnancy: G2P0010 at [redacted]w[redacted]d  1. Supervision of high risk pregnancy, antepartum  2. Non-English speaking patient Patent attorney used  3. Polyhydramnios affecting pregnancy AFI 41 today Repeat 4 weeks  4. Multigravida of  advanced maternal age in third trimester  5. Pre-existing type 2 diabetes mellitus during pregnancy, antepartum CBGs, patient reports she is checking but did not bring log today On insulin which she reports she always takes and takes the metformin and glimepiride sporadically when sugar is high, instructed her to continue metformin and stop glimepiride  Novolog (clear) 12 units TID NPH (white) 37 units am, 12 units pm) FG: 68-70 PP: 90-120 growth today > 90%tile BPP today 8/8 at MFM Cont weekly BPP Encouraged her to bring log to next visit Cont baby ASA   Preterm labor symptoms and general obstetric precautions including but not limited to vaginal bleeding, contractions, leaking of fluid and fetal movement were reviewed in detail with the patient. Please refer to After Visit Summary for other counseling recommendations.  Return in about 2 weeks (around 06/04/2018) for OB visit (MD).  Future Appointments  Date Time Provider [place]  05/28/2018 10:15 AM WOC-WOCA NST [place] WOC  06/04/2018 10:15 AM WOC-WOCA NST WOC-WOCA WOC  06/11/2018 10:15 AM WOC-WOCA NST WOC-WOCA WOC  06/18/2018  8:30 AM WH-MFC [place] 5 WH-MFCUS MFC-US  06/18/2018 10:15 AM WOC-WOCA NST WOC-WOCA WOC  06/25/2018 10:15 AM WOC-WOCA NST WOC-WOCA WOC  07/02/2018 10:15 AM WOC-WOCA NST WOC-WOCA WOC  07/09/2018 10:15 AM WOC-WOCA NST WOC-WOCA WOC  07/16/2018 10:15 AM WOC-WOCA NST WOC-WOCA WOC    Sloan Leiter, MD

## 2018-05-28 ENCOUNTER — Observation Stay (HOSPITAL_COMMUNITY): Payer: Medicaid Other | Admitting: Anesthesiology

## 2018-05-28 ENCOUNTER — Encounter (HOSPITAL_COMMUNITY): Payer: Self-pay | Admitting: *Deleted

## 2018-05-28 ENCOUNTER — Ambulatory Visit: Payer: Self-pay

## 2018-05-28 ENCOUNTER — Other Ambulatory Visit: Payer: Self-pay

## 2018-05-28 ENCOUNTER — Inpatient Hospital Stay (HOSPITAL_COMMUNITY)
Admission: AD | Admit: 2018-05-28 | Discharge: 2018-05-31 | DRG: 786 | Disposition: A | Discharge status: Home / Self Care | Payer: Medicaid Other | Attending: Family Medicine | Admitting: Family Medicine

## 2018-05-28 ENCOUNTER — Ambulatory Visit (INDEPENDENT_AMBULATORY_CARE_PROVIDER_SITE_OTHER): Payer: Medicaid Other | Admitting: *Deleted

## 2018-05-28 ENCOUNTER — Encounter (HOSPITAL_COMMUNITY)
Admission: AD | Disposition: A | Discharge status: Home / Self Care | Payer: Self-pay | Source: Home / Self Care | Attending: Family Medicine

## 2018-05-28 DIAGNOSIS — O24119 Pre-existing diabetes mellitus, type 2, in pregnancy, unspecified trimester: Secondary | ICD-10-CM | POA: Diagnosis present

## 2018-05-28 DIAGNOSIS — Z87891 Personal history of nicotine dependence: Secondary | ICD-10-CM | POA: Diagnosis not present

## 2018-05-28 DIAGNOSIS — O2412 Pre-existing diabetes mellitus, type 2, in childbirth: Secondary | ICD-10-CM | POA: Diagnosis present

## 2018-05-28 DIAGNOSIS — O368390 Maternal care for abnormalities of the fetal heart rate or rhythm, unspecified trimester, not applicable or unspecified: Secondary | ICD-10-CM | POA: Diagnosis present

## 2018-05-28 DIAGNOSIS — E118 Type 2 diabetes mellitus with unspecified complications: Secondary | ICD-10-CM

## 2018-05-28 DIAGNOSIS — E119 Type 2 diabetes mellitus without complications: Secondary | ICD-10-CM | POA: Diagnosis present

## 2018-05-28 DIAGNOSIS — E282 Polycystic ovarian syndrome: Secondary | ICD-10-CM | POA: Diagnosis present

## 2018-05-28 DIAGNOSIS — Z98891 History of uterine scar from previous surgery: Secondary | ICD-10-CM

## 2018-05-28 DIAGNOSIS — O403XX0 Polyhydramnios, third trimester, not applicable or unspecified: Secondary | ICD-10-CM

## 2018-05-28 DIAGNOSIS — O0990 Supervision of high risk pregnancy, unspecified, unspecified trimester: Secondary | ICD-10-CM

## 2018-05-28 DIAGNOSIS — O409XX0 Polyhydramnios, unspecified trimester, not applicable or unspecified: Secondary | ICD-10-CM

## 2018-05-28 DIAGNOSIS — Z9189 Other specified personal risk factors, not elsewhere classified: Secondary | ICD-10-CM

## 2018-05-28 DIAGNOSIS — O24919 Unspecified diabetes mellitus in pregnancy, unspecified trimester: Secondary | ICD-10-CM | POA: Diagnosis present

## 2018-05-28 DIAGNOSIS — Z794 Long term (current) use of insulin: Secondary | ICD-10-CM

## 2018-05-28 DIAGNOSIS — Z3A33 33 weeks gestation of pregnancy: Secondary | ICD-10-CM | POA: Diagnosis not present

## 2018-05-28 DIAGNOSIS — Z789 Other specified health status: Secondary | ICD-10-CM | POA: Diagnosis present

## 2018-05-28 DIAGNOSIS — O09529 Supervision of elderly multigravida, unspecified trimester: Secondary | ICD-10-CM

## 2018-05-28 LAB — COMPREHENSIVE METABOLIC PANEL
ALBUMIN: 2.7 g/dL — AB (ref 3.5–5.0)
ALT: 18 U/L (ref 0–44)
AST: 21 U/L (ref 15–41)
Alkaline Phosphatase: 118 U/L (ref 38–126)
Anion gap: 9 (ref 5–15)
BILIRUBIN TOTAL: 0.4 mg/dL (ref 0.3–1.2)
BUN: 13 mg/dL (ref 6–20)
CHLORIDE: 105 mmol/L (ref 98–111)
CO2: 18 mmol/L — ABNORMAL LOW (ref 22–32)
Calcium: 8.8 mg/dL — ABNORMAL LOW (ref 8.9–10.3)
Creatinine, Ser: 0.58 mg/dL (ref 0.44–1.00)
GFR calc Af Amer: >60 mL/min (ref >60)
GFR calc non Af Amer: >60 mL/min (ref >60)
GLUCOSE: 70 mg/dL (ref 70–99)
POTASSIUM: 4.3 mmol/L (ref 3.5–5.1)
Sodium: 132 mmol/L — ABNORMAL LOW (ref 135–145)
TOTAL PROTEIN: 5.9 g/dL — AB (ref 6.5–8.1)

## 2018-05-28 LAB — CBC
HEMATOCRIT: 36.4 % (ref 36.0–46.0)
HEMOGLOBIN: 12.2 g/dL (ref 12.0–15.0)
MCH: 27.2 pg (ref 26.0–34.0)
MCHC: 33.5 g/dL (ref 30.0–36.0)
MCV: 81.1 fL (ref 78.0–100.0)
Platelets: 225 10*3/uL (ref 150–400)
RBC: 4.49 MIL/uL (ref 3.87–5.11)
RDW: 13.2 % (ref 11.5–15.5)
WBC: 8.9 10*3/uL (ref 4.0–10.5)

## 2018-05-28 LAB — TYPE AND SCREEN
ABO/RH(D): B POS
ANTIBODY SCREEN: NEGATIVE

## 2018-05-28 LAB — PROTEIN / CREATININE RATIO, URINE
Creatinine, Urine: 67 mg/dL
PROTEIN CREATININE RATIO: 0.4 mg/mg{creat} — AB (ref 0.00–0.15)
Total Protein, Urine: 27 mg/dL

## 2018-05-28 LAB — GLUCOSE, CAPILLARY
GLUCOSE-CAPILLARY: 104 mg/dL — AB (ref 70–99)
Glucose-Capillary: 66 mg/dL — ABNORMAL LOW (ref 70–99)
Glucose-Capillary: 97 mg/dL (ref 70–99)

## 2018-05-28 LAB — ABO/RH: ABO/RH(D): B POS

## 2018-05-28 SURGERY — Surgical Case
Anesthesia: Spinal

## 2018-05-28 MED ORDER — ONDANSETRON HCL 4 MG/2ML IJ SOLN: 4.0000 mg | Freq: Three times a day (TID) | INTRAMUSCULAR | Status: DC | PRN

## 2018-05-28 MED ORDER — ASPIRIN EC 81 MG PO TBEC
81.0000 mg | DELAYED_RELEASE_TABLET | Freq: Every day | ORAL | Status: DC
  Filled 2018-05-28: qty 1

## 2018-05-28 MED ORDER — MORPHINE SULFATE (PF) 0.5 MG/ML IJ SOLN
INTRAMUSCULAR | Status: AC
  Filled 2018-05-28: qty 10

## 2018-05-28 MED ORDER — DIPHENHYDRAMINE HCL 25 MG PO CAPS
25.0000 mg | ORAL_CAPSULE | ORAL | Status: DC | PRN
  Administered 2018-05-29: 25 mg via ORAL

## 2018-05-28 MED ORDER — FENTANYL CITRATE (PF) 100 MCG/2ML IJ SOLN
INTRAMUSCULAR | Status: AC
  Filled 2018-05-28: qty 2

## 2018-05-28 MED ORDER — SOD CITRATE-CITRIC ACID 500-334 MG/5ML PO SOLN: 30.0000 mL | Freq: Once | ORAL | Status: DC

## 2018-05-28 MED ORDER — OXYTOCIN 10 UNIT/ML IJ SOLN
INTRAMUSCULAR | Status: AC
  Filled 2018-05-28: qty 4

## 2018-05-28 MED ORDER — FENTANYL CITRATE (PF) 100 MCG/2ML IJ SOLN
INTRAMUSCULAR | Status: DC | PRN
  Administered 2018-05-28: 10 ug via INTRATHECAL

## 2018-05-28 MED ORDER — KETOROLAC TROMETHAMINE 30 MG/ML IJ SOLN: 30.0000 mg | Freq: Four times a day (QID) | INTRAMUSCULAR | Status: AC | PRN

## 2018-05-28 MED ORDER — FENTANYL CITRATE (PF) 100 MCG/2ML IJ SOLN: 25.0000 ug | INTRAMUSCULAR | Status: DC | PRN

## 2018-05-28 MED ORDER — NALBUPHINE HCL 10 MG/ML IJ SOLN: 5.0000 mg | Freq: Once | INTRAMUSCULAR | Status: DC | PRN

## 2018-05-28 MED ORDER — DEXTROSE IN LACTATED RINGERS 5 % IV SOLN
INTRAVENOUS | Status: DC
  Administered 2018-05-28: 15:00:00 via INTRAVENOUS

## 2018-05-28 MED ORDER — SOD CITRATE-CITRIC ACID 500-334 MG/5ML PO SOLN
ORAL | Status: AC
  Administered 2018-05-28: 30 mL
  Filled 2018-05-28: qty 15

## 2018-05-28 MED ORDER — OXYCODONE HCL 5 MG PO TABS: 5.0000 mg | ORAL_TABLET | Freq: Once | ORAL | Status: DC | PRN

## 2018-05-28 MED ORDER — SCOPOLAMINE 1 MG/3DAYS TD PT72
MEDICATED_PATCH | TRANSDERMAL | Status: AC
  Filled 2018-05-28: qty 1

## 2018-05-28 MED ORDER — KETOROLAC TROMETHAMINE 30 MG/ML IJ SOLN
30.0000 mg | Freq: Four times a day (QID) | INTRAMUSCULAR | Status: AC | PRN
  Administered 2018-05-28: 30 mg via INTRAMUSCULAR

## 2018-05-28 MED ORDER — CEFAZOLIN SODIUM-DEXTROSE 2-4 GM/100ML-% IV SOLN
2.0000 g | INTRAVENOUS | Status: AC
  Administered 2018-05-28: 2 g via INTRAVENOUS
  Filled 2018-05-28: qty 100

## 2018-05-28 MED ORDER — MEPERIDINE HCL 25 MG/ML IJ SOLN: 6.2500 mg | INTRAMUSCULAR | Status: DC | PRN

## 2018-05-28 MED ORDER — SCOPOLAMINE 1 MG/3DAYS TD PT72
1.0000 | MEDICATED_PATCH | Freq: Once | TRANSDERMAL | Status: DC
  Filled 2018-05-28: qty 1

## 2018-05-28 MED ORDER — BETAMETHASONE SOD PHOS & ACET 6 (3-3) MG/ML IJ SUSP
12.0000 mg | INTRAMUSCULAR | Status: DC
  Administered 2018-05-28: 12 mg via INTRAMUSCULAR
  Filled 2018-05-28 (×2): qty 2

## 2018-05-28 MED ORDER — DEXTROSE IN LACTATED RINGERS 5 % IV SOLN: INTRAVENOUS | Status: DC

## 2018-05-28 MED ORDER — PHENYLEPHRINE 8 MG IN D5W 100 ML (0.08MG/ML) PREMIX OPTIME
INJECTION | INTRAVENOUS | Status: DC | PRN
  Administered 2018-05-28: 60 ug/min via INTRAVENOUS

## 2018-05-28 MED ORDER — LACTATED RINGERS IV SOLN
INTRAVENOUS | Status: DC
  Administered 2018-05-28: 13:00:00 via INTRAVENOUS

## 2018-05-28 MED ORDER — NALBUPHINE HCL 10 MG/ML IJ SOLN: 5.0000 mg | INTRAMUSCULAR | Status: DC | PRN

## 2018-05-28 MED ORDER — MORPHINE SULFATE (PF) 0.5 MG/ML IJ SOLN
INTRAMUSCULAR | Status: DC | PRN
  Administered 2018-05-28: .2 mg via INTRATHECAL

## 2018-05-28 MED ORDER — BUPIVACAINE IN DEXTROSE 0.75-8.25 % IT SOLN
INTRATHECAL | Status: DC | PRN
  Administered 2018-05-28: 12 mg via INTRATHECAL

## 2018-05-28 MED ORDER — NALOXONE HCL 4 MG/10ML IJ SOLN
1.0000 ug/kg/h | INTRAVENOUS | Status: DC | PRN
  Filled 2018-05-28: qty 5

## 2018-05-28 MED ORDER — NALOXONE HCL 0.4 MG/ML IJ SOLN: 0.4000 mg | INTRAMUSCULAR | Status: DC | PRN

## 2018-05-28 MED ORDER — ZOLPIDEM TARTRATE 5 MG PO TABS: 5.0000 mg | ORAL_TABLET | Freq: Every evening | ORAL | Status: DC | PRN

## 2018-05-28 MED ORDER — LACTATED RINGERS IV SOLN
INTRAVENOUS | Status: DC | PRN
  Administered 2018-05-28: 18:00:00 via INTRAVENOUS

## 2018-05-28 MED ORDER — PHENYLEPHRINE 8 MG IN D5W 100 ML (0.08MG/ML) PREMIX OPTIME
INJECTION | INTRAVENOUS | Status: AC
  Filled 2018-05-28: qty 100

## 2018-05-28 MED ORDER — ONDANSETRON HCL 4 MG/2ML IJ SOLN
INTRAMUSCULAR | Status: DC | PRN
  Administered 2018-05-28: 4 mg via INTRAVENOUS

## 2018-05-28 MED ORDER — OXYCODONE HCL 5 MG/5ML PO SOLN: 5.0000 mg | Freq: Once | ORAL | Status: DC | PRN

## 2018-05-28 MED ORDER — OXYTOCIN 10 UNIT/ML IJ SOLN
INTRAMUSCULAR | Status: DC | PRN
  Administered 2018-05-28: 40 [IU] via INTRAVENOUS

## 2018-05-28 MED ORDER — CEFAZOLIN SODIUM-DEXTROSE 2-4 GM/100ML-% IV SOLN
INTRAVENOUS | Status: AC
  Filled 2018-05-28: qty 100

## 2018-05-28 MED ORDER — ONDANSETRON HCL 4 MG/2ML IJ SOLN
INTRAMUSCULAR | Status: AC
  Filled 2018-05-28: qty 2

## 2018-05-28 MED ORDER — LACTATED RINGERS IV SOLN
INTRAVENOUS | Status: DC | PRN
  Administered 2018-05-28 (×3): via INTRAVENOUS

## 2018-05-28 MED ORDER — ACETAMINOPHEN 325 MG PO TABS
650.0000 mg | ORAL_TABLET | ORAL | Status: DC | PRN
  Administered 2018-05-29: 650 mg via ORAL
  Filled 2018-05-28: qty 2

## 2018-05-28 MED ORDER — CALCIUM CARBONATE ANTACID 500 MG PO CHEW: 2.0000 | CHEWABLE_TABLET | ORAL | Status: DC | PRN

## 2018-05-28 MED ORDER — SCOPOLAMINE 1 MG/3DAYS TD PT72
MEDICATED_PATCH | TRANSDERMAL | Status: DC | PRN
  Administered 2018-05-28: 1 via TRANSDERMAL

## 2018-05-28 MED ORDER — KETOROLAC TROMETHAMINE 30 MG/ML IJ SOLN
INTRAMUSCULAR | Status: AC
  Filled 2018-05-28: qty 1

## 2018-05-28 MED ORDER — SODIUM CHLORIDE 0.9 % IR SOLN
Status: DC | PRN
  Administered 2018-05-28: 1

## 2018-05-28 MED ORDER — DIPHENHYDRAMINE HCL 50 MG/ML IJ SOLN: 12.5000 mg | INTRAMUSCULAR | Status: DC | PRN

## 2018-05-28 MED ORDER — DEXAMETHASONE SODIUM PHOSPHATE 4 MG/ML IJ SOLN
INTRAMUSCULAR | Status: AC
  Filled 2018-05-28: qty 1

## 2018-05-28 MED ORDER — PROMETHAZINE HCL 25 MG/ML IJ SOLN: 6.2500 mg | INTRAMUSCULAR | Status: DC | PRN

## 2018-05-28 MED ORDER — DOCUSATE SODIUM 100 MG PO CAPS
100.0000 mg | ORAL_CAPSULE | Freq: Every day | ORAL | Status: DC
  Filled 2018-05-28 (×2): qty 1

## 2018-05-28 MED ORDER — SODIUM CHLORIDE 0.9% FLUSH: 3.0000 mL | INTRAVENOUS | Status: DC | PRN

## 2018-05-28 MED ORDER — PRENATAL MULTIVITAMIN CH
1.0000 | ORAL_TABLET | Freq: Every day | ORAL | Status: DC
  Filled 2018-05-28 (×2): qty 1

## 2018-05-28 SURGICAL SUPPLY — 33 items
BENZOIN TINCTURE PRP APPL 2/3 (GAUZE/BANDAGES/DRESSINGS) ×3 IMPLANT
CHLORAPREP W/TINT 26ML (MISCELLANEOUS) ×3 IMPLANT
CLAMP CORD UMBIL (MISCELLANEOUS) IMPLANT
CLOSURE STERI STRIP 1/2 X4 (GAUZE/BANDAGES/DRESSINGS) ×2 IMPLANT
CLOSURE WOUND 1/2 X4 (GAUZE/BANDAGES/DRESSINGS) ×1
CLOTH BEACON ORANGE TIMEOUT ST (SAFETY) ×3 IMPLANT
DRSG OPSITE POSTOP 4X10 (GAUZE/BANDAGES/DRESSINGS) ×3 IMPLANT
ELECT REM PT RETURN 9FT ADLT (ELECTROSURGICAL) ×3
ELECTRODE REM PT RTRN 9FT ADLT (ELECTROSURGICAL) ×1 IMPLANT
EXTRACTOR VACUUM M CUP 4 TUBE (SUCTIONS) IMPLANT
EXTRACTOR VACUUM M CUP 4' TUBE (SUCTIONS)
GLOVE BIOGEL PI IND STRL 7.0 (GLOVE) ×2 IMPLANT
GLOVE BIOGEL PI IND STRL 7.5 (GLOVE) ×2 IMPLANT
GLOVE BIOGEL PI INDICATOR 7.0 (GLOVE) ×4
GLOVE BIOGEL PI INDICATOR 7.5 (GLOVE) ×4
GLOVE ECLIPSE 7.5 STRL STRAW (GLOVE) ×3 IMPLANT
GOWN STRL REUS W/TWL LRG LVL3 (GOWN DISPOSABLE) ×9 IMPLANT
KIT ABG SYR 3ML LUER SLIP (SYRINGE) IMPLANT
NEEDLE HYPO 25X5/8 SAFETYGLIDE (NEEDLE) IMPLANT
NS IRRIG 1000ML POUR BTL (IV SOLUTION) ×3 IMPLANT
PACK C SECTION WH (CUSTOM PROCEDURE TRAY) ×3 IMPLANT
PAD OB MATERNITY 4.3X12.25 (PERSONAL CARE ITEMS) ×3 IMPLANT
PENCIL SMOKE EVAC W/HOLSTER (ELECTROSURGICAL) ×3 IMPLANT
RTRCTR C-SECT PINK 25CM LRG (MISCELLANEOUS) ×3 IMPLANT
STRIP CLOSURE SKIN 1/2X4 (GAUZE/BANDAGES/DRESSINGS) ×2 IMPLANT
SUT PLAIN 2 0 XLH (SUTURE) ×3 IMPLANT
SUT VIC AB 0 CTX 36 (SUTURE) ×6
SUT VIC AB 0 CTX36XBRD ANBCTRL (SUTURE) ×3 IMPLANT
SUT VIC AB 2-0 CT1 27 (SUTURE) ×2
SUT VIC AB 2-0 CT1 TAPERPNT 27 (SUTURE) ×1 IMPLANT
SUT VIC AB 4-0 KS 27 (SUTURE) ×3 IMPLANT
TOWEL OR 17X24 6PK STRL BLUE (TOWEL DISPOSABLE) ×3 IMPLANT
TRAY FOLEY W/BAG SLVR 14FR LF (SET/KITS/TRAYS/PACK) ×3 IMPLANT

## 2018-05-28 NOTE — Progress Notes (Signed)
Pt informed that the ultrasound is considered a limited OB ultrasound and is not intended to be a complete ultrasound exam.  Patient also informed that the ultrasound is not being completed with the intent of assessing for fetal or placental anomalies or any pelvic abnormalities.  Explained that the purpose of today's ultrasound is to assess for presentation, BPP and amniotic fluid volume.  Patient acknowledges the purpose of the exam and the limitations of the study.   1210  Pt taken to MAU for prolonged EFM due to NR NST and BPP - 6/8, 0-breathing.

## 2018-05-28 NOTE — MAU Note (Signed)
Pt sent up from clinic. NST was non-reactive today. Polyhydramnios- AFI of 37 today.  Denies bleeding or leaking.  BPP 6/8

## 2018-05-28 NOTE — Anesthesia Preprocedure Evaluation (Signed)
Anesthesia Evaluation  Patient identified by MRN, date of birth, ID band Patient awake    Reviewed: Allergy & Precautions, NPO status , Patient's Chart, lab work & pertinent test results  Airway Mallampati: II  TM Distance: >3 FB Neck ROM: Full    Dental  (+) Dental Advisory Given   Pulmonary former smoker,    breath sounds clear to auscultation       Cardiovascular negative cardio ROS   Rhythm:Regular Rate:Normal     Neuro/Psych negative neurological ROS  negative psych ROS   GI/Hepatic negative GI ROS, Neg liver ROS,   Endo/Other  diabetes, Type 2, Oral Hypoglycemic Agents, Insulin Dependent  Renal/GU negative Renal ROS  negative genitourinary   Musculoskeletal negative musculoskeletal ROS (+)   Abdominal   Peds  Hematology negative hematology ROS (+)   Anesthesia Other Findings   Reproductive/Obstetrics                            Anesthesia Physical Anesthesia Plan  ASA: II  Anesthesia Plan: Spinal   Post-op Pain Management:    Induction:   PONV Risk Score and Plan: Treatment may vary due to age or medical condition, Ondansetron and Scopolamine patch - Pre-op  Airway Management Planned: Natural Airway  Additional Equipment: None  Intra-op Plan:   Post-operative Plan:   Informed Consent: I have reviewed the patients History and Physical, chart, labs and discussed the procedure including the risks, benefits and alternatives for the proposed anesthesia with the patient or authorized representative who has indicated his/her understanding and acceptance.     Plan Discussed with: CRNA and Anesthesiologist  Anesthesia Plan Comments: (Labs reviewed. Platelets acceptable, patient not taking any blood thinning medications. Risks and benefits discussed with patient, patient expressed understanding and wished to proceed.)        Anesthesia Quick Evaluation

## 2018-05-28 NOTE — Op Note (Signed)
Chloe Perez PROCEDURE DATE: 05/28/2018  PREOPERATIVE DIAGNOSIS: Intrauterine pregnancy at  [redacted]w[redacted]d weeks gestation; non-reassuring fetal status  POSTOPERATIVE DIAGNOSIS: The same  PROCEDURE: Primary Low Transverse Cesarean Section  SURGEON:  Dr. Loma Boston  ASSISTANT: Harless Litten, MS3  INDICATIONS: Chloe Perez is a 42 y.o. G2P0010 at [redacted]w[redacted]d scheduled for cesarean section secondary to non-reassuring fetal status.  The risks of cesarean section discussed with the patient included but were not limited to: bleeding which may require transfusion or reoperation; infection which may require antibiotics; injury to bowel, bladder, ureters or other surrounding organs; injury to the fetus; need for additional procedures including hysterectomy in the event of a life-threatening hemorrhage; placental abnormalities wth subsequent pregnancies, incisional problems, thromboembolic phenomenon and other postoperative/anesthesia complications. The patient concurred with the proposed plan, giving informed written consent for the procedure.    FINDINGS:  Viable female infant in vertex presentation.  Apgars 7 and 8, weight, 6 pounds and 5 ounces.  Clear amniotic fluid.  Intact placenta, three vessel cord.  Normal uterus, fallopian tubes and ovaries bilaterally.  ANESTHESIA:    Spinal INTRAVENOUS FLUIDS: 2400 ml ESTIMATED BLOOD LOSS: 900 ml URINE OUTPUT:  75 ml SPECIMENS: Placenta sent to pathology COMPLICATIONS: None immediate  PROCEDURE IN DETAIL:  The patient received intravenous antibiotics and had sequential compression devices applied to her lower extremities while in the preoperative area.  She was then taken to the operating room where spinal anesthesia was administered and was found to be adequate. She was then placed in a dorsal supine position with a leftward tilt, and prepped and draped in a sterile manner.  A foley catheter was placed into her bladder and attached to constant  gravity, which drained clear fluid throughout.  After an adequate timeout was performed, a Pfannenstiel skin incision was made with scalpel and carried through to the underlying layer of fascia. The fascia was incised in the midline and this incision was extended bilaterally using the Mayo scissors. Kocher clamps were applied to the superior aspect of the fascial incision and the underlying rectus muscles were dissected off bluntly. A similar process was carried out on the inferior aspect of the facial incision. The rectus muscles were separated in the midline bluntly and the peritoneum was entered bluntly. An Alexis retractor was placed to aid in visualization of the uterus.  Attention was turned to the lower uterine segment where a transverse hysterotomy was made with a scalpel and extended bilaterally bluntly. The infant was successfully delivered, and cord was clamped and cut and infant was handed over to awaiting neonatology team. Uterine massage was then administered and the placenta delivered intact with three-vessel cord. The uterus was then cleared of clot and debris.  The hysterotomy was closed with 0 Vicryl in a running locked fashion, and an imbricating layer was also placed with a 0 Vicryl. Overall, excellent hemostasis was noted. The abdomen and the pelvis were cleared of all clot and debris and the Ubaldo Glassing was removed. Hemostasis was confirmed on all surfaces.  The peritoneum was reapproximated using 2-0 vicryl running stitches. The fascia was then closed using 0 Vicryl in a running fashion. The subcutaneous layer was reapproximated with plain gut and the skin was closed with 4-0 vicryl. The patient tolerated the procedure well. Sponge, lap, instrument and needle counts were correct x 2. She was taken to the recovery room in stable condition.    Truett Mainland, DO 05/28/2018 6:57 PM

## 2018-05-28 NOTE — Consult Note (Signed)
Neonatology Note:   Attendance at C-section:    I was asked by Dr. Nehemiah Settle to attend this C/S at 33 2/7 weeks due to fetal distress. The mother is a G2P0010, GBS unknown with good prenatal care complicated by polyhydramnios (AFI 41), insulin dep T2 diabetes (plus metformin), advanced maternal age. Growth ~90th%.  This afternoon while monitoring, noted minimal variability and repetitive decelerations warranting delivery.  ROM 0 hours before delivery, fluid clear. Infant fairly vigorous with fair spontaneous cry and tone. [place] only ~30sec.  Brought to warmer and dried and stimulated.  HR >100, responsive to stim with decreased tone and some respiratory effort though insufficient to recruit lungs.  SaO2~60%.  CPAP 5cm started and fio2 titrated.  Max ~40%. Received ~3-4 minutes of CPAP and able to wean to 21% with improvements in effort and air exchange, including tone. Facial hyperpigmentation noted suspicious for bruising. Introduced to mother and dad.   Ap 7/8.  Admit to NICU due to prematurity.  Transported to NICU on RA without issues. Father accompanied [place] and general management plans discussed and questions answered.  Monia Sabal Katherina Mires, MD

## 2018-05-28 NOTE — Anesthesia Procedure Notes (Signed)
Spinal  Patient location during procedure: OR Start time: 05/28/2018 5:35 PM End time: 05/28/2018 5:38 PM Staffing Anesthesiologist: Audry Pili, MD Performed: anesthesiologist  Preanesthetic Checklist Completed: patient identified, surgical consent, pre-op evaluation, timeout performed, IV checked, risks and benefits discussed and monitors and equipment checked Spinal Block Patient position: sitting Prep: DuraPrep Patient monitoring: heart rate, cardiac monitor, continuous pulse ox and blood pressure Approach: midline Location: L2-3 Injection technique: single-shot Needle Needle type: Pencan  Needle gauge: 24 G Additional Notes Functioning IV was confirmed and monitors were applied. Sterile prep and drape, including hand hygiene, mask, and sterile gloves were used. The patient was positioned and the spine was prepped. The skin was anesthetized with lidocaine. Free flow of clear CSF was obtained prior to injecting local anesthetic into the CSF. The spinal needle aspirated freely following injection. The needle was carefully withdrawn. The patient tolerated the procedure well. Consent was obtained prior to the procedure with all questions answered and concerns addressed. Risks including, but not limited to, bleeding, infection, nerve damage, paralysis, failed block, inadequate analgesia, allergic reaction, high spinal, itching, and headache were discussed and the patient wished to proceed.  Renold Don, MD

## 2018-05-28 NOTE — H&P (Signed)
History     CSN: [id number]  Arrival date and time: 05/28/18 1217   First Provider Initiated Contact with Patient 05/28/18 1243      Chief Complaint  Patient presents with  . non-reactivefetal tracing   HPI Chloe Perez 42 y.o. [redacted]w[redacted]d  (Dating adjusted today based on earliest ultrasound of 02-02-18 - was not referenced in anatomy US on 02-21-18).  Comes from clinic with Non reactive NST and BPP 6/8 No breathing - and baby was not moving during testing time.  Here for prolonged monitoring.  Dr. Rosana Hoes on and reviewed initial strip just after arrival to MAU.   Having contractions and minimal variability on monitor strip.  Will try maternal position change and IVF.  Patient Active Problem List   Diagnosis Date Noted  . Polyhydramnios affecting pregnancy 05/03/2018  . Diabetes mellitus in pregnancy, antepartum 02/25/2018  . Non-English speaking patient 02/14/2018  . Advanced maternal age in multigravida 02/14/2018  . Supervision of high risk pregnancy, antepartum 02/06/2018  . PCOS (polycystic ovarian syndrome) 02/08/2017  . 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 Infertility, female, secondary 06/25/2012  . Type II diabetes mellitus ([place]) 06/03/2012      OB History    Gravida  2   Para  0   Term      Preterm      AB  1   Living  0     SAB  1   TAB      Ectopic      Multiple      Live Births              Past Medical History:  Diagnosis Date  . Diabetes mellitus without complication ([place])    DM type 2 metformin   . Missed ab     Past Surgical History:  Procedure Laterality Date  . uterin polyps      Family History  Problem Relation Age of Onset  . Diabetes Other   . Cancer Other   . Hypertension Other   . Heart disease Other   . Diabetes Mother   . Diabetes Father     Social History   Tobacco Use  . Smoking status: Former Research scientist (life sciences)  . Smokeless tobacco: Never Used  Substance Use Topics  . Alcohol use: Yes    Comment: before pregnancy   . Drug use: No    Allergies: No Known Allergies  Medications Prior to Admission  Medication Sig Dispense Refill Last Dose  . aspirin EC 81 MG tablet Take 1 tablet (81 mg total) by mouth daily. Take after 12 weeks for prevention of preeclampsia later in pregnancy 300 tablet 2 05/28/2018 at Unknown time  . insulin aspart (NOVOLOG) 100 UNIT/ML injection Inject 12 Units into the skin 3 (three) times daily before meals. 10 mL 12 05/28/2018 at Unknown time  . insulin NPH Human (HUMULIN N,NOVOLIN N) 100 UNIT/ML injection Inject subcutaneously 31 units in morning and 12 units at bedtime 10 mL 3 05/27/2018 at Unknown time  . metFORMIN (GLUCOPHAGE) 1000 MG tablet Take 1 tablet (1,000 mg total) by mouth 2 (two) times daily with a meal. 180 tablet 3 05/27/2018 at Unknown time  . Prenat-Fe Poly-Methfol-FA-DHA (VITAFOL ULTRA) 29-0.6-0.4-200 MG CAPS Take 1 tablet by mouth daily. 30 capsule 12 05/27/2018 at Unknown time  . glucose blood (ACCU-CHEK GUIDE) test strip Use as instructed 100 each 12 Taking  . Lancets (ACCU-CHEK SOFT TOUCH) lancets Use as instructed 100 each 12 Taking    Review of  Systems  Constitutional: Negative for fever.  Gastrointestinal: Negative for abdominal pain, nausea and vomiting.  Genitourinary: Negative for vaginal bleeding and vaginal discharge.  Musculoskeletal: Positive for back pain.   Physical Exam   Blood pressure (!) 148/81, pulse 73, temperature 97.9 F (36.6 C), resp. rate 18, last menstrual period 08/28/2017.  Physical Exam  Nursing note and vitals reviewed. Constitutional: She is oriented to person, place, and time. She appears well-developed and well-nourished.  HENT:  Head: Normocephalic.  Eyes: EOM are normal.  Neck: Neck supple.  GI: Soft. There is no tenderness. There is no rebound and no guarding.  FHT baseline 140 with minimal variability.  No accels, Contractions every 3-4 minutes.  No deep decelerations.  Strip is very suspicious for mild late decelerations.   Client moved to side lying rather than on her back.  IVF started.  Musculoskeletal: Normal range of motion.  Neurological: She is alert and oriented to person, place, and time.  Skin: Skin is warm and dry.  Psychiatric: She has a normal mood and affect.    MAU Course  Procedures  MDM   Assessment and Plan   1. nonreassuring fetal status 2. Polyhydramnios 3. DM2 4. [redacted]w[redacted]d  Reviewed tracing with Dr Donalee Citrin - recommended observation with rpt BPP this evening. Continuous monitoring Change to D5LR Continue NPO Check CBGs q2 hours until BPP  Truett Mainland, DO 05/28/2018 2:58 PM

## 2018-05-28 NOTE — MAU Provider Note (Signed)
History     CSN: [id number]  Arrival date and time: 05/28/18 1217   First Provider Initiated Contact with Patient 05/28/18 1243      Chief Complaint  Patient presents with  . non-reactivefetal tracing   HPI Chloe Perez 42 y.o. [redacted]w[redacted]d  (Dating adjusted today based on earliest ultrasound of 02-02-18 - was not referenced in anatomy US on 02-21-18).  Comes from clinic with Non reactive NST and BPP 6/8 No breathing - and baby was not moving during testing time.  Here for prolonged monitoring.  Dr. Rosana Hoes on and reviewed initial strip just after arrival to MAU.   Having contractions and minimal variability on monitor strip.  Will try maternal position change and IVF.  Patient Active Problem List   Diagnosis Date Noted  . Polyhydramnios affecting pregnancy 05/03/2018  . Diabetes mellitus in pregnancy, antepartum 02/25/2018  . Non-English speaking patient 02/14/2018  . Advanced maternal age in multigravida 02/14/2018  . Supervision of high risk pregnancy, antepartum 02/06/2018  . PCOS (polycystic ovarian syndrome) 02/08/2017  . 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 Infertility, female, secondary 06/25/2012  . Type II diabetes mellitus ([place]) 06/03/2012      OB History    Gravida  2   Para  0   Term      Preterm      AB  1   Living  0     SAB  1   TAB      Ectopic      Multiple      Live Births              Past Medical History:  Diagnosis Date  . Diabetes mellitus without complication ([place])    DM type 2 metformin   . Missed ab     Past Surgical History:  Procedure Laterality Date  . uterin polyps      Family History  Problem Relation Age of Onset  . Diabetes Other   . Cancer Other   . Hypertension Other   . Heart disease Other   . Diabetes Mother   . Diabetes Father     Social History   Tobacco Use  . Smoking status: Former Research scientist (life sciences)  . Smokeless tobacco: Never Used  Substance Use Topics  . Alcohol use: Yes    Comment: before pregnancy   . Drug use: No    Allergies: No Known Allergies  Medications Prior to Admission  Medication Sig Dispense Refill Last Dose  . aspirin EC 81 MG tablet Take 1 tablet (81 mg total) by mouth daily. Take after 12 weeks for prevention of preeclampsia later in pregnancy 300 tablet 2 05/28/2018 at Unknown time  . insulin aspart (NOVOLOG) 100 UNIT/ML injection Inject 12 Units into the skin 3 (three) times daily before meals. 10 mL 12 05/28/2018 at Unknown time  . insulin NPH Human (HUMULIN N,NOVOLIN N) 100 UNIT/ML injection Inject subcutaneously 31 units in morning and 12 units at bedtime 10 mL 3 05/27/2018 at Unknown time  . metFORMIN (GLUCOPHAGE) 1000 MG tablet Take 1 tablet (1,000 mg total) by mouth 2 (two) times daily with a meal. 180 tablet 3 05/27/2018 at Unknown time  . Prenat-Fe Poly-Methfol-FA-DHA (VITAFOL ULTRA) 29-0.6-0.4-200 MG CAPS Take 1 tablet by mouth daily. 30 capsule 12 05/27/2018 at Unknown time  . glucose blood (ACCU-CHEK GUIDE) test strip Use as instructed 100 each 12 Taking  . Lancets (ACCU-CHEK SOFT TOUCH) lancets Use as instructed 100 each 12 Taking    Review of Systems  Constitutional: Negative for fever.  Gastrointestinal: Negative for abdominal pain, nausea and vomiting.  Genitourinary: Negative for vaginal bleeding and vaginal discharge.  Musculoskeletal: Positive for back pain.   Physical Exam   Blood pressure (!) 148/81, pulse 73, temperature 97.9 F (36.6 C), resp. rate 18, last menstrual period 08/28/2017.  Physical Exam  Nursing note and vitals reviewed. Constitutional: She is oriented to person, place, and time. She appears well-developed and well-nourished.  HENT:  Head: Normocephalic.  Eyes: EOM are normal.  Neck: Neck supple.  GI: Soft. There is no tenderness. There is no rebound and no guarding.  FHT baseline 140 with minimal variability.  No accels, Contractions every 3-4 minutes.  No deep decelerations.  Strip is very suspicious for mild late decelerations.   Client moved to side lying rather than on her back.  IVF started.  Musculoskeletal: Normal range of motion.  Neurological: She is alert and oriented to person, place, and time.  Skin: Skin is warm and dry.  Psychiatric: She has a normal mood and affect.    MAU Course  Procedures Results for orders placed or performed during the hospital encounter of 05/28/18 (from the past 24 hour(s))  Glucose, capillary     Status: Abnormal   Collection Time: 05/28/18  1:31 PM  Result Value Ref Range   Glucose-Capillary 66 (L) 70 - 99 mg/dL  CBC     Status: None   Collection Time: 05/28/18  1:48 PM  Result Value Ref Range   WBC 8.9 4.0 - 10.5 K/uL   RBC 4.49 3.87 - 5.11 MIL/uL   Hemoglobin 12.2 12.0 - 15.0 g/dL   HCT 36.4 36.0 - 46.0 %   MCV 81.1 78.0 - 100.0 fL   MCH 27.2 26.0 - 34.0 pg   MCHC 33.5 30.0 - 36.0 g/dL   RDW 13.2 11.5 - 15.5 %   Platelets 225 150 - 400 K/uL  Type and screen [place]     Status: None   Collection Time: 05/28/18  1:48 PM  Result Value Ref Range   ABO/RH(D) B POS    Antibody Screen NEG    Sample Expiration      05/31/2018 Performed at [facility], [address], [place], [place] [address]   Comprehensive metabolic panel     Status: Abnormal   Collection Time: 05/28/18  1:48 PM  Result Value Ref Range   Sodium 132 (L) 135 - 145 mmol/L   Potassium 4.3 3.5 - 5.1 mmol/L   Chloride 105 98 - 111 mmol/L   CO2 18 (L) 22 - 32 mmol/L   Glucose, Bld 70 70 - 99 mg/dL   BUN 13 6 - 20 mg/dL   Creatinine, Ser 0.58 0.44 - 1.00 mg/dL   Calcium 8.8 (L) 8.9 - 10.3 mg/dL   Total Protein 5.9 (L) 6.5 - 8.1 g/dL   Albumin 2.7 (L) 3.5 - 5.0 g/dL   AST 21 15 - 41 U/L   ALT 18 0 - 44 U/L   Alkaline Phosphatase 118 38 - 126 U/L   Total Bilirubin 0.4 0.3 - 1.2 mg/dL   GFR calc non Af Amer >60 >60 mL/min   GFR calc Af Amer >60 >60 mL/min   Anion gap 9 5 - 15    MDM Dr. Nehemiah Settle in to review the FM strip - minimal variablilty - and to see client.   Interpreter present for the conversation. Dr Nehemiah Settle in consultation with MFM re: management plan betamenthasone given while in  MAU Assessment and Plan  Will place on observation for Non reassuring FHT tracing and further testing this evening. Dr. Nehemiah Settle will enter admit orders.  [place] Weekly L Peder Allums 05/28/2018, 2:38 PM

## 2018-05-28 NOTE — Progress Notes (Signed)
Patient ID: Chloe Perez, female   DOB: [birth year], 42 y.o.   MRN: [id number]  FHT looking worse with minimal variability and repetitive decelerations. Reviewed FHT with Dr Donalee Citrin - who recommended delivery. I do not think the baby would tolerate labor. Will proceed with vaginal delivery.  The risks of cesarean section discussed with the patient included but were not limited to: bleeding which may require transfusion or reoperation; infection which may require antibiotics; injury to bowel, bladder, ureters or other surrounding organs; injury to the fetus; need for additional procedures including hysterectomy in the event of a life-threatening hemorrhage; placental abnormalities wth subsequent pregnancies, incisional problems, thromboembolic phenomenon and other postoperative/anesthesia complications. The patient concurred with the proposed plan, giving informed written consent for the procedure.   Patient has been NPO since 6am. She will remain NPO for procedure. Anesthesia and OR aware.  Preoperative prophylactic Ancef ordered on call to the OR.  To OR when ready.  Truett Mainland, DO 05/28/2018 5:01 PM

## 2018-05-28 NOTE — Transfer of Care (Signed)
Immediate Anesthesia Transfer of Care Note  Patient: Chloe Perez  Procedure(s) Performed: CESAREAN SECTION (N/A )  Patient Location: PACU  Anesthesia Type:Spinal  Level of Consciousness: awake, alert  and oriented  Airway & Oxygen Therapy: Patient Spontanous Breathing  Post-op Assessment: Report given to RN and Post -op Vital signs reviewed and stable  Post vital signs: Reviewed and stable HR 72, RR 14, SaO2 97%, BP 109/62  Last Vitals:  Vitals Value Taken Time  BP    Temp    Pulse    Resp    SpO2      Last Pain:  Vitals:   05/28/18 1528  TempSrc:   PainSc: 0-No pain         Complications: No apparent anesthesia complications

## 2018-05-29 ENCOUNTER — Encounter (HOSPITAL_COMMUNITY): Payer: Self-pay | Admitting: Family Medicine

## 2018-05-29 DIAGNOSIS — Z98891 History of uterine scar from previous surgery: Secondary | ICD-10-CM

## 2018-05-29 LAB — CBC
HCT: 33 % — ABNORMAL LOW (ref 36.0–46.0)
Hemoglobin: 11.2 g/dL — ABNORMAL LOW (ref 12.0–15.0)
MCH: 27.3 pg (ref 26.0–34.0)
MCHC: 33.9 g/dL (ref 30.0–36.0)
MCV: 80.3 fL (ref 78.0–100.0)
Platelets: 240 10*3/uL (ref 150–400)
RBC: 4.11 MIL/uL (ref 3.87–5.11)
RDW: 13.1 % (ref 11.5–15.5)
WBC: 14.9 10*3/uL — AB (ref 4.0–10.5)

## 2018-05-29 LAB — GLUCOSE, CAPILLARY: GLUCOSE-CAPILLARY: 248 mg/dL — AB (ref 70–99)

## 2018-05-29 MED ORDER — LACTATED RINGERS IV SOLN: INTRAVENOUS | Status: DC

## 2018-05-29 MED ORDER — SOD CITRATE-CITRIC ACID 500-334 MG/5ML PO SOLN: 30.0000 mL | Freq: Once | ORAL | Status: DC

## 2018-05-29 MED ORDER — OXYTOCIN 40 UNITS IN LACTATED RINGERS INFUSION - SIMPLE MED: 2.5000 [IU]/h | INTRAVENOUS | Status: AC

## 2018-05-29 MED ORDER — DIBUCAINE 1 % RE OINT: 1.0000 "application " | TOPICAL_OINTMENT | RECTAL | Status: DC | PRN

## 2018-05-29 MED ORDER — SENNOSIDES-DOCUSATE SODIUM 8.6-50 MG PO TABS
2.0000 | ORAL_TABLET | ORAL | Status: DC
  Administered 2018-05-29 – 2018-05-31 (×3): 2 via ORAL
  Filled 2018-05-29 (×4): qty 2

## 2018-05-29 MED ORDER — METFORMIN HCL 500 MG PO TABS
1000.0000 mg | ORAL_TABLET | Freq: Two times a day (BID) | ORAL | Status: DC
  Administered 2018-05-29 – 2018-05-31 (×5): 1000 mg via ORAL
  Filled 2018-05-29 (×7): qty 2

## 2018-05-29 MED ORDER — SCOPOLAMINE 1 MG/3DAYS TD PT72
1.0000 | MEDICATED_PATCH | Freq: Once | TRANSDERMAL | Status: DC
  Filled 2018-05-29: qty 1

## 2018-05-29 MED ORDER — TETANUS-DIPHTH-ACELL PERTUSSIS 5-2.5-18.5 LF-MCG/0.5 IM SUSP
0.5000 mL | Freq: Once | INTRAMUSCULAR | Status: AC
  Administered 2018-05-31: 0.5 mL via INTRAMUSCULAR

## 2018-05-29 MED ORDER — ENOXAPARIN SODIUM 40 MG/0.4ML ~~LOC~~ SOLN
40.0000 mg | SUBCUTANEOUS | Status: DC
  Administered 2018-05-30 – 2018-05-31 (×2): 40 mg via SUBCUTANEOUS
  Filled 2018-05-29 (×3): qty 0.4

## 2018-05-29 MED ORDER — INSULIN ASPART 100 UNIT/ML ~~LOC~~ SOLN
10.0000 [IU] | Freq: Once | SUBCUTANEOUS | Status: AC
  Administered 2018-05-29: 10 [IU] via SUBCUTANEOUS

## 2018-05-29 MED ORDER — FAMOTIDINE 20 MG PO TABS
20.0000 mg | ORAL_TABLET | Freq: Once | ORAL | Status: AC
  Administered 2018-05-29: 20 mg via ORAL
  Filled 2018-05-29: qty 1

## 2018-05-29 MED ORDER — SIMETHICONE 80 MG PO CHEW
80.0000 mg | CHEWABLE_TABLET | Freq: Three times a day (TID) | ORAL | Status: DC
  Administered 2018-05-29 – 2018-05-31 (×6): 80 mg via ORAL
  Filled 2018-05-29 (×6): qty 1

## 2018-05-29 MED ORDER — DIPHENHYDRAMINE HCL 25 MG PO CAPS
25.0000 mg | ORAL_CAPSULE | Freq: Four times a day (QID) | ORAL | Status: DC | PRN
  Filled 2018-05-29: qty 1

## 2018-05-29 MED ORDER — ACETAMINOPHEN 325 MG PO TABS: 650.0000 mg | ORAL_TABLET | ORAL | Status: DC | PRN

## 2018-05-29 MED ORDER — COCONUT OIL OIL
1.0000 "application " | TOPICAL_OIL | Status: DC | PRN
  Administered 2018-05-29: 1 via TOPICAL
  Filled 2018-05-29: qty 120

## 2018-05-29 MED ORDER — MENTHOL 3 MG MT LOZG: 1.0000 | LOZENGE | OROMUCOSAL | Status: DC | PRN

## 2018-05-29 MED ORDER — SIMETHICONE 80 MG PO CHEW
80.0000 mg | CHEWABLE_TABLET | ORAL | Status: DC
  Filled 2018-05-29: qty 1

## 2018-05-29 MED ORDER — WITCH HAZEL-GLYCERIN EX PADS: 1.0000 "application " | MEDICATED_PAD | CUTANEOUS | Status: DC | PRN

## 2018-05-29 MED ORDER — OXYCODONE-ACETAMINOPHEN 5-325 MG PO TABS
2.0000 | ORAL_TABLET | ORAL | Status: DC | PRN
  Administered 2018-05-30: 2 via ORAL
  Filled 2018-05-29 (×2): qty 2

## 2018-05-29 MED ORDER — ZOLPIDEM TARTRATE 5 MG PO TABS: 5.0000 mg | ORAL_TABLET | Freq: Every evening | ORAL | Status: DC | PRN

## 2018-05-29 MED ORDER — PRENATAL MULTIVITAMIN CH
1.0000 | ORAL_TABLET | Freq: Every day | ORAL | Status: DC
  Administered 2018-05-29 – 2018-05-31 (×3): 1 via ORAL
  Filled 2018-05-29 (×3): qty 1

## 2018-05-29 MED ORDER — SIMETHICONE 80 MG PO CHEW: 80.0000 mg | CHEWABLE_TABLET | ORAL | Status: DC | PRN

## 2018-05-29 MED ORDER — OXYCODONE-ACETAMINOPHEN 5-325 MG PO TABS
1.0000 | ORAL_TABLET | ORAL | Status: DC | PRN
  Administered 2018-05-29 – 2018-05-31 (×4): 1 via ORAL
  Filled 2018-05-29 (×3): qty 1

## 2018-05-29 MED ORDER — IBUPROFEN 600 MG PO TABS
600.0000 mg | ORAL_TABLET | Freq: Four times a day (QID) | ORAL | Status: DC
  Administered 2018-05-29 – 2018-05-31 (×9): 600 mg via ORAL
  Filled 2018-05-29 (×9): qty 1

## 2018-05-29 NOTE — Plan of Care (Signed)
  Problem: Education: Goal: Knowledge of General Education information will improve Outcome: Progressing   Problem: Health Behavior/Discharge Planning: Goal: Ability to manage health-related needs will improve Outcome: Progressing   Problem: Clinical Measurements: Goal: Ability to maintain clinical measurements within normal limits will improve Outcome: Progressing   Problem: Clinical Measurements: Goal: Will remain free from infection Outcome: Progressing   Problem: Clinical Measurements: Goal: Diagnostic test results will improve Outcome: Progressing

## 2018-05-29 NOTE — Anesthesia Postprocedure Evaluation (Signed)
Anesthesia Post Note  Patient: Chloe Perez  Procedure(s) Performed: CESAREAN SECTION (N/A )     Patient location during evaluation: [facility] Anesthesia Type: Spinal Level of consciousness: awake Pain management: pain level controlled Vital Signs Assessment: post-procedure vital signs reviewed and stable Respiratory status: spontaneous breathing Cardiovascular status: stable Postop Assessment: no headache, no backache, spinal receding, patient able to bend at knees, no apparent nausea or vomiting, adequate PO intake and able to ambulate Anesthetic complications: no    Last Vitals:  Vitals:   05/29/18 0828 05/29/18 1243  BP: 111/65 107/66  Pulse: 73 60  Resp: 18 18  Temp: 37.2 C 36.9 C  SpO2: 95% 98%    Last Pain:  Vitals:   05/29/18 1243  TempSrc: Oral  PainSc:    Pain Goal:                 Jayveion Stalling

## 2018-05-29 NOTE — Progress Notes (Signed)
Faculty Attending Note  Post Op Day 1  Subjective: Patient is feeling well. She reports well controlled pain on PO pain meds. She is ambulating and denies light-headedness or dizziness. She is not passing flatus yet. She is tolerating a regular diet without nausea/vomiting. Bleeding is moderate. She is breast feeding. Baby is in NICU for prematurity and doing well.  Objective: Blood pressure 111/65, pulse 73, temperature 98.9 F (37.2 C), temperature source Oral, resp. rate 18, last menstrual period 08/28/2017, SpO2 95 %. Temp:  [97.8 F (36.6 C)-99.1 F (37.3 C)] 98.9 F (37.2 C) (07/03 0828) Pulse Rate:  [68-87] 73 (07/03 0828) Resp:  [9-30] 18 (07/03 0828) BP: (107-148)/(50-90) 111/65 (07/03 0828) SpO2:  [94 %-100 %] 95 % (07/03 0828)  Physical Exam:  General: alert, oriented, cooperative Chest: normal respiratory effort Heart: RRR  Abdomen: soft, appropriately tender to palpation, incision covered by dressing with no evidence of active bleeding  Uterine Fundus: firm, 2 fingers below the umbilicus Lochia: moderate, rubra DVT Evaluation: no evidence of DVT Extremities: no edema, no calf tenderness  UOP: voiding spontaneously   Current Facility-Administered Medications:  .  acetaminophen (TYLENOL) tablet 650 mg, 650 mg, Oral, Q4H PRN, Stinson, Jacob J, DO .  coconut oil, 1 application, Topical, PRN, Truett Mainland, DO, 1 application at 08/67/61 0846 .  witch hazel-glycerin (TUCKS) pad 1 application, 1 application, Topical, PRN **AND** dibucaine (NUPERCAINAL) 1 % rectal ointment 1 application, 1 application, Rectal, PRN, Truett Mainland, DO .  diphenhydrAMINE (BENADRYL) injection 12.5 mg, 12.5 mg, Intravenous, Q4H PRN **OR** diphenhydrAMINE (BENADRYL) capsule 25 mg, 25 mg, Oral, Q4H PRN, Audry Pili, MD .  diphenhydrAMINE (BENADRYL) capsule 25 mg, 25 mg, Oral, Q6H PRN, Truett Mainland, DO .  [START ON 05/30/2018] enoxaparin (LOVENOX) injection 40 mg, 40 mg, Subcutaneous,  Q24H, Stinson, Jacob J, DO .  ibuprofen (ADVIL,MOTRIN) tablet 600 mg, 600 mg, Oral, Q6H, Truett Mainland, DO, 600 mg at 05/29/18 0846 .  ketorolac (TORADOL) 30 MG/ML injection 30 mg, 30 mg, Intravenous, Q6H PRN **OR** ketorolac (TORADOL) 30 MG/ML injection 30 mg, 30 mg, Intramuscular, Q6H PRN, Audry Pili, MD, 30 mg at 05/28/18 2028 .  lactated ringers infusion, , Intravenous, Continuous, Stinson, Jacob J, DO .  menthol-cetylpyridinium (CEPACOL) lozenge 3 mg, 1 lozenge, Oral, Q2H PRN, Truett Mainland, DO .  metFORMIN (GLUCOPHAGE) tablet 1,000 mg, 1,000 mg, Oral, BID WC, Truett Mainland, DO, 1,000 mg at 05/29/18 0848 .  nalbuphine (NUBAIN) injection 5 mg, 5 mg, Intravenous, Q4H PRN **OR** nalbuphine (NUBAIN) injection 5 mg, 5 mg, Subcutaneous, Q4H PRN, Audry Pili, MD .  nalbuphine (NUBAIN) injection 5 mg, 5 mg, Intravenous, Once PRN **OR** nalbuphine (NUBAIN) injection 5 mg, 5 mg, Subcutaneous, Once PRN, Audry Pili, MD .  naloxone [facility]) injection 0.4 mg, 0.4 mg, Intravenous, PRN **AND** sodium chloride flush (NS) 0.9 % injection 3 mL, 3 mL, Intravenous, PRN, Audry Pili, MD .  naloxone HCl (NARCAN) 2 mg in dextrose 5 % 250 mL infusion, 1-4 mcg/kg/hr, Intravenous, Continuous PRN, Audry Pili, MD .  ondansetron [facility]) injection 4 mg, 4 mg, Intravenous, Q8H PRN, Audry Pili, MD .  oxyCODONE-acetaminophen (PERCOCET/ROXICET) 5-325 MG per tablet 1 tablet, 1 tablet, Oral, Q4H PRN, Nehemiah Settle, Jacob J, DO .  oxyCODONE-acetaminophen (PERCOCET/ROXICET) 5-325 MG per tablet 2 tablet, 2 tablet, Oral, Q4H PRN, Truett Mainland, DO .  oxytocin (PITOCIN) IV infusion 40 units in LR 1000 mL - Premix, 2.5 Units/hr, Intravenous, Continuous,  Truett Mainland, DO .  prenatal multivitamin tablet 1 tablet, 1 tablet, Oral, Q1200, Truett Mainland, DO .  scopolamine (TRANSDERM-SCOP) 1 MG/3DAYS 1.5 mg, 1 patch, Transdermal, Once, Audry Pili, MD .  scopolamine (TRANSDERM-SCOP) 1 MG/3DAYS 1.5  mg, 1 patch, Transdermal, Once, Audry Pili, MD .  senna-docusate (Senokot-S) tablet 2 tablet, 2 tablet, Oral, Q24H, Stinson, Tanna Savoy, DO .  simethicone [facility]) chewable tablet 80 mg, 80 mg, Oral, TID PC, Truett Mainland, DO, 80 mg at 05/29/18 0848 .  simethicone (MYLICON) chewable tablet 80 mg, 80 mg, Oral, Q24H, Stinson, Jacob J, DO .  simethicone (MYLICON) chewable tablet 80 mg, 80 mg, Oral, PRN, Truett Mainland, DO .  sodium citrate-citric acid (ORACIT) solution 30 mL, 30 mL, Oral, Once, Audry Pili, MD .  Tdap (BOOSTRIX) injection 0.5 mL, 0.5 mL, Intramuscular, Once, Stinson, Jacob J, DO .  zolpidem (AMBIEN) tablet 5 mg, 5 mg, Oral, QHS PRN, Truett Mainland, DO Recent Labs    05/28/18 1348 05/29/18 0818  HGB 12.2 11.2*  HCT 36.4 33.0*    Assessment/Plan:  Patient is 42 y.o. [id number] POD#1 s/p 1LTCS at [redacted]w[redacted]d for non-reassuring fetal status. She is doing well, recovering appropriately and complains only of some pain and soreness. Restarted on home metformin 1000 mg BID, will cont to check CBGs.    Spanish interpretor used  Diabetic diet Metformin 1000 mg BID Continue routine post partum care Pain meds prn Regular diet   Feliz Beam, M.D. Attending [place] for [facility]  05/29/2018, 9:48 AM

## 2018-05-29 NOTE — Lactation Note (Signed)
This note was copied from a baby's chart. Lactation Consultation Note  Patient Name: Chloe Perez [id number] Date: 05/29/2018    [facility] Initial Visit:  P1 mother whose baby is 33+2 weeks and in the NICU  Spoke with RN to determine if mother has been set up with the DEBP.  She verified that she had started mother pumping.  I will not awaken her at this time.                Tirth Cothron R Kiley Solimine 05/29/2018, 4:21 AM

## 2018-05-30 LAB — GLUCOSE, CAPILLARY: GLUCOSE-CAPILLARY: 117 mg/dL — AB (ref 70–99)

## 2018-05-30 NOTE — Progress Notes (Signed)
Subjective: Postpartum Day 2: Cesarean Delivery Patient reports incisional pain. She is ambulating, tolerating a regular diet, voiding without difficulty    Objective: Vital signs in last 24 hours: Temp:  [98.3 F (36.8 C)-98.9 F (37.2 C)] 98.5 F (36.9 C) (07/04 0801) Pulse Rate:  [60-74] 74 (07/04 0801) Resp:  [16-18] 18 (07/04 0801) BP: (104-121)/(56-66) 104/56 (07/04 0801) SpO2:  [98 %-100 %] 100 % (07/04 0801) CBG (last 3)  Recent Labs    05/28/18 1614 05/29/18 1710 05/30/18 0715  GLUCAP 104* 248* 117*    Physical Exam:  General: alert, cooperative and no distress Lochia: appropriate Uterine Fundus: firm Incision: honeycomb dressing minimally stained DVT Evaluation: No evidence of DVT seen on physical exam.  Recent Labs    05/28/18 1348 05/29/18 0818  HGB 12.2 11.2*  HCT 36.4 33.0*    Assessment/Plan: Status post Cesarean section. Doing well postoperatively.  Encouraged continued ambulation Plan for discharge tomorrow.  Damontre Millea 05/30/2018, 9:31 AM

## 2018-05-30 NOTE — Lactation Note (Signed)
This note was copied from a baby's chart. Lactation Consultation Note  Patient Name: Chloe Perez [id number] Date: 05/30/2018  Mom is pumping every 2-3 hours and obtaining small amounts of colostrum.  Denies questions or concerns.  Encouraged to call for concerns/assist prn.   Maternal Data    Feeding    LATCH Score                   Interventions    Lactation Tools Discussed/Used     Consult Status      Ave Filter 05/30/2018, 2:57 PM

## 2018-05-31 DIAGNOSIS — O368390 Maternal care for abnormalities of the fetal heart rate or rhythm, unspecified trimester, not applicable or unspecified: Secondary | ICD-10-CM | POA: Diagnosis present

## 2018-05-31 LAB — GLUCOSE, CAPILLARY: Glucose-Capillary: 117 mg/dL — ABNORMAL HIGH (ref 70–99)

## 2018-05-31 MED ORDER — IBUPROFEN 600 MG PO TABS: 600.0000 mg | ORAL_TABLET | Freq: Four times a day (QID) | ORAL | 1 refills | Status: DC | PRN

## 2018-05-31 MED ORDER — OXYCODONE-ACETAMINOPHEN 5-325 MG PO TABS: 1.0000 | ORAL_TABLET | ORAL | 0 refills | Status: DC | PRN

## 2018-05-31 MED ORDER — GLIMEPIRIDE 2 MG PO TABS: 2.0000 mg | ORAL_TABLET | Freq: Every day | ORAL | 2 refills | Status: DC

## 2018-05-31 MED ORDER — METFORMIN HCL 1000 MG PO TABS: 1000.0000 mg | ORAL_TABLET | Freq: Two times a day (BID) | ORAL | 3 refills | Status: DC

## 2018-05-31 NOTE — Discharge Summary (Signed)
Physician Discharge Summary  Patient ID: Chloe Perez MRN: [id number] DOB/AGE: 1975/12/16 42 y.o.  Admit date: 05/28/2018 Discharge date: 05/31/2018  Discharge Diagnoses:  Principal Problem:   Non-reassuring electronic fetal monitoring tracing Active Problems:   Type II diabetes mellitus (HCC)   PCOS (polycystic ovarian syndrome)   Supervision of high risk pregnancy, antepartum   Non-English speaking patient   Advanced maternal age in multigravida   Diabetes mellitus in pregnancy, antepartum   Polyhydramnios affecting pregnancy   At risk for alteration in fetal status   Status post cesarean section   Reason for Admission: non-reassuring fetal tracing Prenatal Procedures: NST and ultrasound Intrapartum Procedures: low transverse c-section Postpartum Procedures: none Complications-Operative and Postpartum: none    Hospital Course: Please see HPI dated 05/28/2018 for details. This is a 42 y.o. G2P0010 now POD#3 from a primary c-section for non-reassuring fetal tracing at Chloe Perez. Her antepartum course was complicated by polyhydramnios and Type II DM on metformin and insulin. Her post partum course was uncomplicated. By day 3, she was ambulating, passing flatus and pain was well controlled. She does report headache this am that is relieved by pain medication. She was discharged home POD#3 in good condition. Baby remains in NICU and is doing well. Instructions for follow up given. She will follow up in 2 weeks for incision check.   She was restarted on metformin on hospital and has had elevated blood glucose which in house, will be sent home on metformin as well as half of pre-pregnancy dosing of glimepiride (2mg  daily). She will f/u with PCP for further diabetic management.    Physical exam  Vitals:   05/30/18 1611 05/30/18 2018 05/30/18 2348 05/31/18 0758  BP: (!) 123/55 129/61 118/61 115/64  Pulse: 81 76 73 73  Resp: 16 18 18 18   Temp: 98 F (36.7 C) 98.5 F (36.9 C)  98.8 F (37.1 C) 98.4 F (36.9 C)  TempSrc: Oral   Oral  SpO2: 99% 99% 99% 98%   Physical Exam:  General: alert, oriented, cooperative Chest: CTAB, normal respiratory effort Heart: RRR  Abdomen: +BS, soft, appropriately tender to palpation, incision with dressing intact, no evidence of bleeding or infection Uterine Fundus: firm, 2 fingers below the umbilicus Lochia: moderate, rubra DVT Evaluation: no evidence of DVT Extremities: no edema, no calf tenderness   Postpartum contraception: None  Disposition: home  Discharged Condition: good  Discharge Instructions    Call MD for:   Complete by:  As directed    Call MD for:  difficulty breathing, headache or visual disturbances   Complete by:  As directed    Call MD for:  extreme fatigue   Complete by:  As directed    Call MD for:  hives   Complete by:  As directed    Call MD for:  persistant dizziness or light-headedness   Complete by:  As directed    Call MD for:  persistant nausea and vomiting   Complete by:  As directed    Call MD for:  redness, tenderness, or signs of infection (pain, swelling, redness, odor or green/yellow discharge around incision site)   Complete by:  As directed    Call MD for:  severe uncontrolled pain   Complete by:  As directed    Call MD for:  temperature >100.4   Complete by:  As directed    Diet - low sodium heart healthy   Complete by:  As directed    Lifting restrictions   Complete by:  As directed    Weight restriction of 20 lbs.     Allergies as of 05/31/2018   No Known Allergies     Medication List    STOP taking these medications   accu-chek soft touch lancets   aspirin EC 81 MG tablet   glucose blood test strip Commonly known as:  ACCU-CHEK GUIDE   insulin aspart 100 UNIT/ML injection Commonly known as:  novoLOG   insulin NPH Human 100 UNIT/ML injection Commonly known as:  HUMULIN N,NOVOLIN N     TAKE these medications   glimepiride 2 MG tablet Commonly known as:   AMARYL Take 1 tablet (2 mg total) by mouth daily with breakfast.   ibuprofen 600 MG tablet Commonly known as:  ADVIL,MOTRIN Take 1 tablet (600 mg total) by mouth every 6 (six) hours as needed.   metFORMIN 1000 MG tablet Commonly known as:  GLUCOPHAGE Take 1 tablet (1,000 mg total) by mouth 2 (two) times daily with a meal.   oxyCODONE-acetaminophen 5-325 MG tablet Commonly known as:  PERCOCET/ROXICET Take 1 tablet by mouth every 4 (four) hours as needed (pain scale 4-7).   VITAFOL ULTRA 29-0.6-0.4-200 MG Caps Take 1 tablet by mouth daily.      Follow-up Information    Chloe Perez. Schedule an appointment as soon as possible for a visit in 2 day(s).   Specialty:  Obstetrics and Gynecology Why:  Make appointment for 2 weeks and 4 weeks. Contact information: [address], [place] [phone]          Signed: Sloan Perez 05/31/2018, 11:40 AM

## 2018-05-31 NOTE — Discharge Instructions (Signed)
Cesarean Delivery, Care After Refer to this sheet in the next few weeks. These instructions provide you with information about caring for yourself after your procedure. Your health care provider may also give you more specific instructions. Your treatment has been planned according to current medical practices, but problems sometimes occur. Call your health care provider if you have any problems or questions after your procedure. What can I expect after the procedure? After the procedure, it is common to have:  A small amount of blood or clear fluid coming from the incision.  Some redness, swelling, and pain in your incision area.  Some abdominal pain and soreness.  Vaginal bleeding (lochia).  Pelvic cramps.  Fatigue.  Follow these instructions at home: Incision care   Follow instructions from your health care provider about how to take care of your incision. Make sure you: ? Wash your hands with soap and water before you change your bandage (dressing). If soap and water are not available, use hand sanitizer. ? If you have a dressing, change it as told by your health care provider. ? Leave stitches (sutures), skin staples, skin glue, or adhesive strips in place. These skin closures may need to stay in place for 2 weeks or longer. If adhesive strip edges start to loosen and curl up, you may trim the loose edges. Do not remove adhesive strips completely unless your health care provider tells you to do that.  Check your incision area every day for signs of infection. Check for: ? More redness, swelling, or pain. ? More fluid or blood. ? Warmth. ? Pus or a bad smell.  When you cough or sneeze, hug a pillow. This helps with pain and decreases the chance of your incision opening up (dehiscing). Do this until your incision heals. Medicines  Take over-the-counter and prescription medicines only as told by your health care provider.  If you were prescribed an antibiotic medicine, take it  as told by your health care provider. Do not stop taking the antibiotic until it is finished. Driving  Do not drive or operate heavy machinery while taking prescription pain medicine. Lifestyle  Do not drink alcohol. This is especially important if you are breastfeeding or taking pain medicine.  Do not use tobacco products, including cigarettes, chewing tobacco, or e-cigarettes. If you need help quitting, ask your health care provider. Tobacco can delay wound healing. Eating and drinking  Drink at least 8 eight-ounce glasses of water every day unless told not to by your health care provider. If you breastfeed, you may need to drink more water than this.  Eat high-fiber foods every day. These foods may help prevent or relieve constipation. High-fiber foods include: ? Whole grain cereals and breads. ? Brown rice. ? Beans. ? Fresh fruits and vegetables. Activity  Return to your normal activities as told by your health care provider. Ask your health care provider what activities are safe for you.  Rest as much as possible. Try to rest or take a nap while your baby is sleeping.  Do not lift anything that is heavier than your baby or 10 lb (4.5 kg) as told by your health care provider.  Ask your health care provider when you can engage in sexual activity. This may depend on your: ? Risk of infection. ? Healing rate. ? Comfort and desire to engage in sexual activity. Bathing  Do not take baths, swim, or use a hot tub until your health care provider approves. Ask your health care provider if  you can take showers. You may only be allowed to take sponge baths until your incision heals. General instructions  Do not use tampons or douches until your health care provider approves.  Wear: ? Loose, comfortable clothing. ? A supportive and well-fitting bra.  Watch for any blood clots that may pass from your vagina. These may look like clumps of dark red, brown, or black discharge.  Keep  your perineum clean and dry as told by your health care provider.  Wipe from front to back when you use the toilet.  If possible, have someone help you care for your baby and help with household activities for a few days after you leave the hospital.  Keep all follow-up visits for you and your baby as told by your health care provider. This is important. Contact a health care provider if:  You have: ? Bad-smelling vaginal discharge. ? Difficulty urinating. ? Pain when urinating. ? A sudden increase or decrease in the frequency of your bowel movements. ? More redness, swelling, or pain around your incision. ? More fluid or blood coming from your incision. ? Pus or a bad smell coming from your incision. ? A fever. ? A rash. ? Little or no interest in activities you used to enjoy. ? Questions about caring for yourself or your baby. ? Nausea.  Your incision feels warm to the touch.  Your breasts turn red or become painful or hard.  You feel unusually sad or worried.  You vomit.  You pass large blood clots from your vagina. If you pass a blood clot, save it to show to your health care provider. Do not flush blood clots down the toilet without showing your health care provider.  You urinate more than usual.  You are dizzy or light-headed.  You have not breastfed and have not had a menstrual period for 12 weeks after delivery.  You stopped breastfeeding and have not had a menstrual period for 12 weeks after stopping breastfeeding. Get help right away if:  You have: ? Pain that does not go away or get better with medicine. ? Chest pain. ? Difficulty breathing. ? Blurred vision or spots in your vision. ? Thoughts about hurting yourself or your baby. ? New pain in your abdomen or in one of your legs. ? A severe headache.  You faint.  You bleed from your vagina so much that you fill two sanitary pads in one hour. This information is not intended to replace advice given to  you by your health care provider. Make sure you discuss any questions you have with your health care provider. Document Released: 08/05/2002 Document Revised: 12/16/2016 Document Reviewed: 10/18/2015 Elsevier Interactive Patient Education  2018 [place] por Essie Christine, cuidados posteriores Cesarean Delivery, Care After Siga estas instrucciones durante las prximas semanas. Estas indicaciones le proporcionan informacin acerca de cmo deber cuidarse despus del procedimiento. Su mdico tambin podr darle indicaciones ms especficas. El tratamiento ha sido planificado segn las prcticas mdicas actuales, pero en algunos casos pueden ocurrir problemas. Comunquese con el mdico si tiene algn problema o preguntas despus del procedimiento. Qu puedo esperar despus del procedimiento? Despus del procedimiento, es comn [employer] siguientes sntomas:  Una pequea cantidad de sangre o un lquido transparente que sale de la incisin.  Tiene enrojecimiento, hinchazn o dolor en la zona de la incisin.  Dolores y [employer] [place] [employer].  Hemorragia vaginal (loquios).  Calambres plvicos.  Fatiga.  Siga estas indicaciones en su casa: Cuidados de la incisin  Siga las indicaciones del mdico acerca del cuidado de la incisin. Haga lo siguiente: ? Lvese las manos con agua y jabn antes de cambiar la venda (vendaje). Use desinfectante para manos si no dispone de [place]. ? Cambie el vendaje como se lo haya indicado el mdico. ? No retire los puntos (suturas), las grapas cutneas, la goma para cerrar la piel o las tiras Pescadero. Es posible que estos cierres cutneos Animal nutritionist en la piel durante 2semanas o ms tiempo. Si los bordes de las tiras adhesivas empiezan a despegarse y Therapist, sports, puede recortar los que estn sueltos. No retire las tiras [facility] por completo a menos que el mdico se lo indique.  [place] zona de la incisin para detectar  signos de infeccin. Est atenta a los siguientes signos: ? Aumento del enrojecimiento, la hinchazn o Conservation officer, historic buildings. ? Mayor presencia de lquido o [place]. ? Calor. ? Pus o mal olor.  [place], abrace [employer]. Esto ayuda con el dolor y [employer] posibilidad de que su incisin se abra (dehiscencia). Haga esto hasta que cicatrice completamente. Medicamentos  [employer] medicamentos de venta libre y los recetados solamente como se lo haya indicado el mdico.  Si le recetaron un antibitico, tmelo como se lo haya indicado el mdico. No interrumpa la administracin del antibitico hasta que lo haya terminado. Conducir  No conduzca ni opere maquinaria pesada mientras toma analgsicos recetados.  No conduzca durante 24horas si le administraron un sedante. Estilo de vida  No beba alcohol. Esto es de suma importancia si est amamantando o toma analgsicos.  No consuma productos que contengan tabaco, incluidos cigarrillos, tabaco de Higher education careers adviser o cigarrillos electrnicos. Si necesita ayuda para dejar de fumar, consulte al mdico. El tabaco puede retrasar la cicatrizacin. Qu debe comer y beber  Beba al menos 8vasos de ochoonzas (47cc) de agua todos los das a menos que el mdico le indique lo contrario. Si amamanta, quiz deba beber an ms cantidad de agua.  Coma alimentos ricos en [employer]. Estos alimentos pueden ayudarla a prevenir o Cytogeneticist. Los alimentos ricos en fibras incluyen, entre otros: ? Panes y cereales integrales. ? Arroz integral. ? Optometrist. ? Lambert Mody y verduras frescas. Actividad  Retome sus actividades normales como se lo haya indicado el mdico. Pregntele al mdico qu actividades son seguras para usted.  Descanse todo lo que pueda. Trate de descansar o tomar una siesta mientras el beb duerme.  No levante objetos que pesen ms que su beb o ms de 10 libras (4,5 kg), como se lo haya indicado el mdico.  Pregntele al  mdico cundo puede retomar la actividad sexual. Esto puede depender de lo siguiente: ? Riesgo de sufrir una infeccin. ? Velocidad de cicatrizacin. ? Comodidad y deseo de [employer] sexual. Baarse  No tome baos de inmersin, no nade ni use el jacuzzi hasta que el mdico lo autorice. Pregntele al mdico si puede ducharse. [place] Pounds solo le permitan darse baos de esponja hasta que la incisin se cure.  Mantenga el vendaje seco, como se lo haya indicado el mdico. Instrucciones generales  No use tampones ni se haga duchas vaginales hasta que el mdico la autorice.  Use lo siguiente: ? Ropa cmoda y suelta. ? Un sostn firme y [place].  Controle la sangre que elimina por la vagina para detectar cogulos de [place]. Estos pueden tener el aspecto de grumos de color rojo oscuro, o Administrator o  negra.  Mantenga el perineo limpio y seco, como se lo haya indicado el mdico.  Cuando vaya al bao, siempre higiencese de adelante hacia atrs.  Si es posible, pdale a alguien que la ayude a cuidar de su beb y con las tareas del hogar durante [employer] despus de que le den el alta del hospital.  Dallie Dad a todas las visitas de seguimiento para usted y el beb, como se lo haya indicado el mdico. Esto es importante. Comunquese con un mdico si:  Tiene los siguientes sntomas: ? Una secrecin vaginal con mal olor. ? Dificultad para orinar. ? Dolor al [employer]. ? Aumento o disminucin repentinos de la frecuencia de las deposiciones. ? Aumento del enrojecimiento, la hinchazn o el dolor alrededor de la incisin. ? Aumento del lquido o de la sangre que sale de la incisin. ? Pus o mal olor en TEFL teacher de la incisin. ? Fiebre. ? Erupcin cutnea. ? Poco inters o falta de inters en actividades que solan gustarle. ? Dudas sobre su cuidado y el del beb. ? Nuseas.  La incisin est caliente al tacto.  Siente dolor en las mamas y se ponen rojas o duras.  Siente  tristeza o preocupacin de forma inusual.  Vomita.  Elimina cogulos de sangre grandes por la vagina. Si expulsa un cogulo de sangre, gurdelo para mostrrselo al [employer]. No tire la cadena sin mostrarle los cogulos de sangre a su mdico.  Orina ms de lo habitual.  Se siente mareada o se desmaya.  No ha amamantado y no ha tenido un perodo menstrual durante 12 semanas despus del [place].  Dej de amamantar al beb y no ha tenido su perodo menstrual durante 12 semanas despus de haber dejado de Economist. [place] ayuda de inmediato si:  Tiene los siguientes sntomas: ? Dolor que no desaparece o no mejora con medicamentos. ? Tourist information centre manager. ? Dificultad para respirar. ? Visin borrosa o Geologist, engineering. ? Pensamientos de autolesionarse o lesionar al beb. ? Primary school teacher abdomen o en una de las piernas. ? Dolor de cabeza intenso.  Se desmaya.  Tiene una hemorragia tan intensa de la vagina que [employer] compresas higinicas en Leone Brand. Esta informacin no tiene Marine scientist el consejo del mdico. Asegrese de hacerle al mdico cualquier pregunta que tenga. Document Released: 11/13/2005 Document Revised: 03/05/2017 Document Reviewed: 10/18/2015 Elsevier Interactive Patient Education  [employer].

## 2018-06-01 ENCOUNTER — Encounter (HOSPITAL_COMMUNITY): Payer: Self-pay | Admitting: *Deleted

## 2018-06-01 ENCOUNTER — Inpatient Hospital Stay (HOSPITAL_COMMUNITY): Payer: Self-pay | Admitting: Anesthesiology

## 2018-06-01 ENCOUNTER — Inpatient Hospital Stay (HOSPITAL_COMMUNITY)
Admission: AD | Admit: 2018-06-01 | Discharge: 2018-06-01 | Disposition: A | Discharge status: Home / Self Care | Payer: Self-pay | Source: Ambulatory Visit | Attending: Obstetrics and Gynecology | Admitting: Obstetrics and Gynecology

## 2018-06-01 DIAGNOSIS — O368390 Maternal care for abnormalities of the fetal heart rate or rhythm, unspecified trimester, not applicable or unspecified: Secondary | ICD-10-CM

## 2018-06-01 DIAGNOSIS — G971 Other reaction to spinal and lumbar puncture: Secondary | ICD-10-CM

## 2018-06-01 DIAGNOSIS — Z87891 Personal history of nicotine dependence: Secondary | ICD-10-CM | POA: Insufficient documentation

## 2018-06-01 DIAGNOSIS — O409XX0 Polyhydramnios, unspecified trimester, not applicable or unspecified: Secondary | ICD-10-CM

## 2018-06-01 DIAGNOSIS — O894 Spinal and epidural anesthesia-induced headache during the puerperium: Secondary | ICD-10-CM | POA: Insufficient documentation

## 2018-06-01 MED ORDER — LACTATED RINGERS IV BOLUS
1000.0000 mL | Freq: Once | INTRAVENOUS | Status: AC
  Administered 2018-06-01: 1000 mL via INTRAVENOUS

## 2018-06-01 NOTE — Anesthesia Preprocedure Evaluation (Signed)
Anesthesia Evaluation  Patient identified by MRN, date of birth, ID band Patient awake    Reviewed: Allergy & Precautions, NPO status , Patient's Chart, lab work & pertinent test results  Airway Mallampati: II  TM Distance: >3 FB Neck ROM: Full    Dental  (+) Dental Advisory Given   Pulmonary former smoker,    breath sounds clear to auscultation       Cardiovascular negative cardio ROS   Rhythm:Regular Rate:Normal     Neuro/Psych negative neurological ROS  negative psych ROS   GI/Hepatic negative GI ROS, Neg liver ROS,   Endo/Other  diabetes, Type 2, Oral Hypoglycemic Agents, Insulin Dependent  Renal/GU negative Renal ROS  negative genitourinary   Musculoskeletal negative musculoskeletal ROS (+)   Abdominal   Peds  Hematology negative hematology ROS (+)   Anesthesia Other Findings   Reproductive/Obstetrics                             Anesthesia Physical  Anesthesia Plan  ASA: II  Anesthesia Plan: Epidural   Post-op Pain Management:    Induction:   PONV Risk Score and Plan: Treatment may vary due to age or medical condition  Airway Management Planned: Natural Airway  Additional Equipment: None  Intra-op Plan:   Post-operative Plan:   Informed Consent: I have reviewed the patients History and Physical, chart, labs and discussed the procedure including the risks, benefits and alternatives for the proposed anesthesia with the patient or authorized representative who has indicated his/her understanding and acceptance.     Plan Discussed with: CRNA and Anesthesiologist  Anesthesia Plan Comments:         Anesthesia Quick Evaluation

## 2018-06-01 NOTE — Anesthesia Procedure Notes (Signed)
Epidural Patient location during procedure: holding area Start time: 06/01/2018 1:46 PM End time: 06/01/2018 2:01 PM  Staffing Anesthesiologist: Lynda Rainwater, MD Performed: anesthesiologist   Preanesthetic Checklist Completed: patient identified, site marked, surgical consent, pre-op evaluation, timeout performed, IV checked, risks and benefits discussed and monitors and equipment checked  Epidural Patient position: sitting Prep: ChloraPrep Patient monitoring: heart rate, cardiac monitor, continuous pulse ox and blood pressure Approach: midline Location: L2-L3 Injection technique: LOR saline  Needle:  Needle type: Tuohy  Needle gauge: 17 G Needle length: 9 cm Needle insertion depth: 4 cm Catheter type: closed end flexible Catheter size: 20 Guage Test dose: negative  Assessment Events: blood not aspirated, injection not painful, no injection resistance, negative IV test and no paresthesia  Additional Notes 30 ml of patient's blood placed in epidural space.  Tolerated procedure well.Reason for block:Epidural Blood Patch

## 2018-06-01 NOTE — MAU Note (Signed)
Chloe Perez is a 42 y.o. here in MAU reporting:  +headache Delivered via c/section on tuesday Onset of complaint: started yesterday after pumping Pain score: 9/10 Has tried ibuprofen at 1030 am with no relief. States headache feels better when she lays down Vitals:   06/01/18 1248  BP: (!) 142/67  Pulse: 72  Resp: 17  Temp: 97.8 F (36.6 C)  SpO2: 100%     Lab orders placed from triage: ua

## 2018-06-01 NOTE — MAU Provider Note (Signed)
History   [id number] sp c/s of Tuesday in with c/o severe headache. States as long as she is lying flat headache goes away.but when she is upright it is booming and incapacitating. States it runs up the back of her neck and to the back of her head.  CSN: [id number]  Arrival date & time 06/01/18  1230   None     Chief Complaint  Patient presents with  . Headache    HPI  Past Medical History:  Diagnosis Date  . Diabetes mellitus without complication ([place])    DM type 2 metformin   . Missed ab     Past Surgical History:  Procedure Laterality Date  . CESAREAN SECTION N/A 05/28/2018   Procedure: CESAREAN SECTION;  Surgeon: Truett Mainland, DO;  Location: [place];  Service: Obstetrics;  Laterality: N/A;  . uterin polyps      Family History  Problem Relation Age of Onset  . Diabetes Other   . Cancer Other   . Hypertension Other   . Heart disease Other   . Diabetes Mother   . Diabetes Father     Social History   Tobacco Use  . Smoking status: Former Research scientist (life sciences)  . Smokeless tobacco: Never Used  Substance Use Topics  . Alcohol use: Not Currently    Comment: before pregnancy  . Drug use: No    OB History as of 05/28/2018    Gravida  2   Para  0   Term      Preterm      AB  1   Living  0     SAB  1   TAB      Ectopic      Multiple      Live Births              Review of Systems  Constitutional: Negative.   HENT: Negative.   Eyes: Negative.   Respiratory: Negative.   Cardiovascular: Negative.   Gastrointestinal: Negative.   Endocrine: Negative.   Genitourinary: Negative.   Musculoskeletal: Positive for neck pain.  Skin: Negative.   Neurological: Positive for headaches.  Hematological: Negative.   Psychiatric/Behavioral: Negative.     Allergies  Patient has no known allergies.  Home Medications    BP 133/72   Pulse 69   Temp 97.8 F (36.6 C) (Oral)   Resp 17   Wt 159 lb 1.3 oz (72.2 kg)   LMP 08/28/2017   SpO2 100% Comment:  ra  Breastfeeding? Unknown   BMI 31.07 kg/m   Physical Exam  Constitutional: She is oriented to person, place, and time. She appears well-developed and well-nourished.  HENT:  Head: Normocephalic.  Eyes: Pupils are equal, round, and reactive to light.  Cardiovascular: Normal rate, regular rhythm, normal heart sounds and intact distal pulses.  Pulmonary/Chest: Effort normal and breath sounds normal.  Abdominal: Soft. Bowel sounds are normal.  Musculoskeletal: Normal range of motion.  Neurological: She is alert and oriented to person, place, and time. She has normal strength. She displays a negative Romberg sign.  Skin: Skin is warm and dry.  Psychiatric: She has a normal mood and affect. Her behavior is normal.    MAU Course  Procedures (including critical care time)  Labs Reviewed  URINALYSIS, ROUTINE W REFLEX MICROSCOPIC   No results found.   1. Postoperative spinal headache   2. Non-reassuring electronic fetal monitoring tracing   3. Polyhydramnios affecting pregnancy       MDM  VSS, incision intact, no redness swelling or draiage. abd soft non tender . Spinal headache will consult anesthesia. Blood patch per anesthesia.  D/c home per anesthesia.

## 2018-06-01 NOTE — Anesthesia Postprocedure Evaluation (Signed)
Anesthesia Post Note  Patient: Chloe Perez  Procedure(s) Performed: AN AD HOC BLOOD PATCH     Patient location during evaluation: MAU Anesthesia Type: Epidural Level of consciousness: awake and alert Pain management: pain level controlled Vital Signs Assessment: post-procedure vital signs reviewed and stable Respiratory status: spontaneous breathing, nonlabored ventilation and respiratory function stable Cardiovascular status: stable Postop Assessment: no headache, no backache and epidural receding Anesthetic complications: no    Last Vitals:  Vitals:   06/01/18 1316 06/01/18 1350  BP: 133/72 (!) 137/58  Pulse: 69 74  Resp:    Temp:    SpO2:  99%    Last Pain:  Vitals:   06/01/18 1248  TempSrc: Oral  PainSc: 9    Pain Goal:                 Lynda Rainwater

## 2018-06-03 ENCOUNTER — Other Ambulatory Visit (HOSPITAL_COMMUNITY): Payer: Self-pay | Admitting: Obstetrics and Gynecology

## 2018-06-03 ENCOUNTER — Other Ambulatory Visit: Payer: Self-pay | Admitting: Obstetrics and Gynecology

## 2018-06-03 LAB — GLUCOSE, CAPILLARY: Glucose-Capillary: 122 mg/dL — ABNORMAL HIGH (ref 70–99)

## 2018-06-04 ENCOUNTER — Encounter: Payer: Self-pay | Admitting: Obstetrics

## 2018-06-04 ENCOUNTER — Other Ambulatory Visit: Payer: Self-pay

## 2018-06-07 ENCOUNTER — Other Ambulatory Visit: Payer: Self-pay | Admitting: Obstetrics and Gynecology

## 2018-06-07 ENCOUNTER — Ambulatory Visit: Payer: Self-pay

## 2018-06-07 DIAGNOSIS — O403XX0 Polyhydramnios, third trimester, not applicable or unspecified: Secondary | ICD-10-CM

## 2018-06-11 ENCOUNTER — Other Ambulatory Visit: Payer: Self-pay

## 2018-06-18 ENCOUNTER — Ambulatory Visit (HOSPITAL_COMMUNITY): Payer: Self-pay

## 2018-06-18 ENCOUNTER — Encounter (HOSPITAL_COMMUNITY): Payer: Self-pay

## 2018-06-18 ENCOUNTER — Other Ambulatory Visit: Payer: Self-pay

## 2018-06-25 ENCOUNTER — Ambulatory Visit (INDEPENDENT_AMBULATORY_CARE_PROVIDER_SITE_OTHER): Payer: Medicaid Other | Admitting: Obstetrics & Gynecology

## 2018-06-25 ENCOUNTER — Encounter: Payer: Self-pay | Admitting: Obstetrics & Gynecology

## 2018-06-25 ENCOUNTER — Other Ambulatory Visit: Payer: Self-pay

## 2018-06-25 DIAGNOSIS — Z98891 History of uterine scar from previous surgery: Secondary | ICD-10-CM

## 2018-06-25 DIAGNOSIS — E118 Type 2 diabetes mellitus with unspecified complications: Secondary | ICD-10-CM

## 2018-06-25 MED ORDER — VITAFOL ULTRA 29-0.6-0.4-200 MG PO CAPS: 1.0000 | ORAL_CAPSULE | Freq: Every day | ORAL | 12 refills | Status: DC

## 2018-06-25 NOTE — Patient Instructions (Signed)
Prueba de Papanicolaou (Pap Test) POR QU ME DEBO REALIZAR ESTA PRUEBA? A esta prueba tambin se la denomina "frotis de Pap". Es una prueba de deteccin que se [place] para Hydrographic surveyor signos de cncer de vagina, cuello del tero y tero. La prueba tambin puede identificar la presencia de infeccin o cambios precancerosos. El mdico probablemente le recomiende que se realice esta prueba en forma regular. Esta prueba puede realizarse de la siguiente manera:  Cada 3 aos, a partir de los 21 aos.  Cada 5 aos, en combinacin con las pruebas que se realizan para Product manager presencia del virus del Engineer, technical sales (VPH).  Con mayor o menor frecuencia, en funcin de otras enfermedades que tenga. QU TIPO DE MUESTRA SE TOMA? El mdico utilizar un pequeo hisopo de algodn, una esptula de plstico o un cepillo para Field seismologist una muestra de clulas de la superficie del cuello del tero. El cuello del tero es la apertura del tero, que tambin se conoce como matriz. Tambin pueden recolectarse las secreciones del cuello del tero y la vagina. Hissop?  Tenga en cuenta en qu etapa del ciclo menstrual se encuentra. Es posible que Estate agent la prueba si est Forensic psychologist en que debe realizrsela.  Si el da en que debe realizarse la prueba tiene una infeccin vaginal aparente, deber reprogramar la prueba.  Pueden pedirle que evite tomar una ducha o bao el da de la prueba o el da anterior.  Algunos medicamentos pueden [employer] de la prueba, como los digitlicos y Lexicographer. Si toma alguno de [employer], hable con su mdico antes de [employer] prueba. [place]? Los [employer] Entertainment de la prueba pueden indicar diversas enfermedades. Estas pueden incluir lo siguiente:  Cncer. Si bien los resultados de la prueba de Papanicolaou no pueden utilizarse para Community education officer de cuello del tero,  de vagina o de tero, pueden indicar que existe una posibilidad de presencia de cncer. En [place], ser necesario realizar pruebas adicionales para determinar la presencia de cncer.  Enfermedad de transmisin sexual.  Infecciones por hongos.  Infeccin por parsitos.  Infeccin por herpes.  Una enfermedad que causa o favorece la infertilidad. Es su responsabilidad retirar el resultado del [place]. Consulte en el laboratorio o en el departamento en el que fue realizado el estudio cundo y cmo podr [employer]. Comunquese con el mdico si tiene [employer]. Esta informacin no tiene Marine scientist el consejo del mdico. Asegrese de hacerle al mdico cualquier pregunta que tenga. Document Released: 05/01/2008 Document Revised: 12/04/2014 Document Reviewed: 04/06/2014 Elsevier Interactive Patient Education  2018 [place] anticonceptivo Contraception Choices La anticoncepcin, o los mtodos anticonceptivos, hace referencia a los mtodos o dispositivos que evitan el [place]. Mtodos hormonales Implante anticonceptivo Un implante anticonceptivo consiste en un tubo plstico delgado que contiene una hormona. Se inserta en la parte superior del brazo. Puede Technical brewer por 3 aos. Inyecciones de progestina sola Las inyecciones de progestina sola contienen progestina, una forma sinttica de la hormona progesterona. Un mdico las administra cada 3 meses. Pldoras anticonceptivas Las pldoras anticonceptivas son pastillas que contienen hormonas que evitan el [place]. Deben tomarse una vez al da, preferentemente a [place]. Parches anticonceptivos El parche anticonceptivo contiene hormonas que evitan el [place]. Se coloca en la piel, debe cambiarse una vez a la semana durante tres semanas y debe retirarse en la cuarta semana. Se  necesita una receta para utilizar este mtodo anticonceptivo. Anillo  vaginal Un anillo vaginal contiene hormonas que evitan el embarazo. Se coloca en la vagina durante tres semanas y se retira en la cuarta semana. Luego se repite el proceso con un anillo nuevo. Se necesita una receta para utilizar este mtodo anticonceptivo. Anticonceptivo de Freight forwarder Los anticonceptivos de emergencia evitan el embarazo despus de Best boy sexo sin proteccin. Vienen en forma de pldora y pueden tomarse hasta 5 das despus de [place]. Funcionan mejor cuando se toman lo ms pronto posible luego de Merrill [employer]. La mayora de los anticonceptivos de emergencia estn disponibles sin receta mdica. Este mtodo no debe utilizarse como el nico mtodo anticonceptivo. Mtodos de barrera Preservativo masculino Un preservativo masculino es una vaina delgada que se coloca sobre el pene durante el sexo. Los preservativos evitan que el esperma ingrese en el cuerpo de la [place]. Pueden utilizarse con un espermicida para aumentar la efectividad. Deben desecharse luego de su uso. Preservativo femenino Un preservativo femenino es una vaina blanda y holgada que se coloca en la vagina antes de [place]. El preservativo evita que el esperma ingrese en el cuerpo de la [place]. Deben desecharse luego de su uso. Diafragma Un diafragma es una barrera blanda con forma de cpula. Se inserta en la vagina antes del sexo, junto con un espermicida. El diafragma bloquea el ingreso de esperma en el tero, y el espermicida mata a los espermatozoides. El Designer, television/film set en la vagina durante 6 a 8 horas despus de Best boy sexo y debe retirarse en el plazo de las 24 horas. Un diafragma es recetado y colocado por un mdico. Debe reemplazarse cada 1 a 2 aos, despus de dar a luz, de aumentar ms de 15lb (6.8kg) y de [place] plvica. Capuchn cervical Un capuchn cervical es una copa redonda y blanda de ltex o plstico que se coloca en el cuello uterino. Se inserta en la vagina antes del sexo, junto con un  espermicida. Bloquea el ingreso del esperma en el tero. El capuchn Medical illustrator durante 6 a 8 horas despus de Best boy sexo y debe retirarse en el plazo de las 48 horas. Un capuchn cervical debe ser recetado y colocado por un mdico. Debe reemplazarse cada 2aos. Esponja Una esponja es una pieza blanda y circular de espuma de poliuretano que contiene espermicida. La esponja ayuda a bloquear el ingreso de esperma en el tero, y el espermicida mata a los espermatozoides. Marylyn Ishihara, debe humedecerla e insertarla en la vagina. Debe insertarse antes de Merrill [employer], debe permanecer dentro al menos durante 6 horas despus de tener sexo y debe retirarse y Aeronautical engineer en el plazo de las 30 horas. Espermicidas Los espermicidas son sustancias qumicas que matan o bloquean al esperma y no lo dejan ingresar al cuello uterino y al tero. Vienen en forma de crema, gel, supositorio, espuma o comprimido. Un espermicida debe insertarse en la vagina con un aplicador al menos 10 o 15 minutos antes de tener sexo para dar tiempo a que surta [place]. El proceso debe repetirse cada vez que tenga sexo. Los espermicidas no requieren Furniture conservator/restorer. Anticonceptivos intrauterinos Dispositivo intrauterino (DIU). Un DIU un dispositivo en forma de T que se coloca en el tero. Hay dos tipos:  DIU hormonal. Este tipo contiene progestina, una forma sinttica de la hormona progesterona. Este tipo puede permanecer colocado durante 3 a 5 aos.  DIU de cobre. Este tipo est recubierto con un alambre de cobre. Puede permanecer  colocado durante 10 aos.  Mtodos anticonceptivos permanentes Ligadura de trompas en la mujer En este mtodo, se sellan, atan u obstruyen las trompas de Falopio durante una ciruga para Product/process development scientist que el vulo descienda [place]. Esterilizacin histeroscpica En este mtodo, se coloca un implante pequeo y flexible dentro de cada trompa de Falopio. Los implantes hacen que se forme un tejido  cicatricial en las trompas de Falopio y que las obstruya para que el espermatozoide no pueda llegar al vulo. El procedimiento demora alrededor de 3 meses para que sea [place]. Debe utilizarse otro mtodo anticonceptivo durante esos 3 meses. Esterilizacin masculina Este es un procedimiento que consiste en atar los conductos que transportan el esperma (vasectoma). Luego del procedimiento, el hombre Equities trader lquido (semen). Mtodos de planificacin natural Planificacin familiar natural En este mtodo, la pareja no tiene SPX Corporation la mujer podra quedar [place]. Mtodo calendario Buyer, retail un seguimiento de la duracin de cada ciclo menstrual, identificar los [employer] que se puede producir un Media planner y no Best boy sexo durante esos [place]. Mtodo de la ovulacin En este mtodo, la pareja evita tener sexo durante la ovulacin. Mtodo sintotrmico Este mtodo implica no tener sexo durante la ovulacin. Normalmente, la mujer comprueba la ovulacin al observar cambios en su temperatura y en la consistencia del moco cervical. Mtodo posovulacin En este mtodo, la pareja espera a que finalice la ovulacin para Adult nurse. Resumen  La anticoncepcin, o los mtodos anticonceptivos, hace referencia a los mtodos o dispositivos que evitan el [place].  Los mtodos anticonceptivos hormonales incluyen implantes, inyecciones, pastillas, parches, anillos vaginales y anticonceptivos de Freight forwarder.  Los mtodos anticonceptivos de barrera pueden incluir preservativos masculinos, preservativos femeninos, diafragmas, capuchones cervicales, esponjas y espermicidas.  Hay dos tipos de DIU (dispositivos intrauterinos). Un DIU puede colocarse en el tero de una mujer para evitar el embarazo durante 3 a 5 aos.  La esterilizacin permanente puede realizarse mediante un procedimiento para hombres, mujeres o ambos.  Los NIKE de Marine scientist natural incluyen no  tener SPX Corporation la mujer podra quedar [place]. Esta informacin no tiene Marine scientist el consejo del mdico. Asegrese de hacerle al mdico cualquier pregunta que tenga. Document Released: 11/13/2005 Document Revised: 03/05/2017 Document Reviewed: 03/05/2017 Elsevier Interactive Patient Education  2018 [employer].

## 2018-06-25 NOTE — Progress Notes (Signed)
[place] Partum Exam  Chloe Perez is a 42 y.o. [id number] female who presents for a postpartum visit. She is 4 weeks postpartum following a low cervical transverse Cesarean section. I have fully reviewed the prenatal and intrapartum course. The delivery was at [redacted]w[redacted]d gestational weeks.  Anesthesia: epidural. Postpartum course has been unremarkable. Baby's course has been at [place]. Baby is feeding by breast. Bleeding staining only. Bowel function is normal. Bladder function is normal. Patient is not sexually active. Contraception method is none. Postpartum depression screening:neg  The following portions of the patient's history were reviewed and updated as appropriate: allergies, current medications, past family history, past medical history, past social history, past surgical history and problem list. Last pap smear done unsure and was not sure  Review of Systems Pertinent items are noted in HPI.    Objective:  unknown if currently breastfeeding.  General:  alert and cooperative           Abdomen: soft, non-tender; bowel sounds normal; no masses,  no organomegaly and incsion intact   Vulva:  not evaluated  Vagina: not evaluated                    Assessment:    normal 4 week postpartum exam. Pap smear she declined at today's visit.   Plan:   1. Contraception: none 2. Information on [facility] given 3. Follow up in as needed.   Woodroe Mode, MD

## 2018-07-02 ENCOUNTER — Other Ambulatory Visit: Payer: Self-pay

## 2018-07-09 ENCOUNTER — Other Ambulatory Visit: Payer: Self-pay

## 2018-07-16 ENCOUNTER — Other Ambulatory Visit: Payer: Self-pay

## 2018-07-23 ENCOUNTER — Other Ambulatory Visit: Payer: Self-pay

## 2018-08-23 ENCOUNTER — Encounter (INDEPENDENT_AMBULATORY_CARE_PROVIDER_SITE_OTHER): Payer: Self-pay | Admitting: Physician Assistant

## 2018-08-23 ENCOUNTER — Other Ambulatory Visit: Payer: Self-pay

## 2018-08-23 ENCOUNTER — Ambulatory Visit (INDEPENDENT_AMBULATORY_CARE_PROVIDER_SITE_OTHER): Payer: Self-pay | Admitting: Physician Assistant

## 2018-08-23 VITALS — BP 143/78 | HR 79 | Temp 97.9°F | Ht 62.0 in | Wt 145.0 lb

## 2018-08-23 DIAGNOSIS — E118 Type 2 diabetes mellitus with unspecified complications: Secondary | ICD-10-CM

## 2018-08-23 DIAGNOSIS — R21 Rash and other nonspecific skin eruption: Secondary | ICD-10-CM

## 2018-08-23 LAB — POCT GLYCOSYLATED HEMOGLOBIN (HGB A1C): Hemoglobin A1C: 8.1 % — AB (ref 4.0–5.6)

## 2018-08-23 MED ORDER — METFORMIN HCL 1000 MG PO TABS: 1000.0000 mg | ORAL_TABLET | Freq: Two times a day (BID) | ORAL | 3 refills | Status: DC

## 2018-08-23 MED ORDER — TRIAMCINOLONE ACETONIDE 0.5 % EX OINT: 1.0000 "application " | TOPICAL_OINTMENT | Freq: Two times a day (BID) | CUTANEOUS | 0 refills | Status: DC

## 2018-08-23 NOTE — Progress Notes (Signed)
Pt has a breakout on stomach and arm for about 1 week. She states that the bumps are hot and they itch.

## 2018-08-23 NOTE — Patient Instructions (Signed)
Erupcin cutnea (Rash) Una erupcin cutnea es un cambio en el color de la piel. Una erupcin tambin puede cambiar la forma en que se siente la piel. Hay muchas afecciones y SUPER[employer] INC que pueden causar una erupcin. CUIDADOS EN EL HOGAR Est atento a cualquier cambio en los sntomas. Estas indicaciones pueden ayudarlo con el trastorno: Northrop Grumman o Smur[employer]Stone Container medicamentos de venta libre y los recetados solamente como se lo haya indicado el mdico. Estos pueden incluir lo siguiente:  Rica Records con corticoides.  Lociones para Barrister's clerk.  Antihistamnicos por va oral. Cuidado de la piel  Coloque compresas fras en las zonas afectadas.  Trate de tomar un bao con lo siguiente: ? Sales de [place]. Siga las instrucciones del envase. Puede conseguirlas en la tienda de comestibles o la farmacia local. ? Bicarbonato de sodio. Vierta un poco en la baera como se lo haya indicado el mdico. ? Avena coloidal. Siga las instrucciones del envase. Puede conseguirla en la tienda de comestibles o la farmacia local.  Intente colocarse una pasta de bicarbonato de sodio sobre la piel. Agregue agua al bicarbonato de sodio hasta que se forme una pasta.  No se rasque ni se refriegue la piel.  Evite cubrir la erupcin. Asegrese de que la erupcin est expuesta al aire todo lo posible. Instrucciones generales  Evite los baos de inmersin y las duchas calientes ya que pueden empeorar la picazn. Ardelia Mems ducha fra puede Runner, broadcasting/film/video.  Evite los detergentes y los jabones perfumados, y los perfumes. Utilice jabones, detergentes, perfumes y cosmticos suaves.  Evite todo lo que le cause erupcin cutnea. Lleve un diario como ayuda para registrar lo que le causa erupcin. Escriba los siguientes datos: ? Lo que come. ? Los cosmticos que [place]. ? Lo que bebe. ? La ropa que [place]. [place] alhajas.  Concurra a todas las visitas de control como se lo haya indicado el mdico.  Esto es importante. SOLICITE AYUDA SI:  Transpira de noche.  Pierde peso.  Orina ms de lo normal.  Se siente dbil.  Vomita.  Tiene un color amarillo en la piel o en la zona blanca del ojo (ictericia).  La piel: ? Siente hormigueos. ? Se adormece.  La erupcin cutnea: ? No desaparece despus de Xcel Energy. ? [place].  Usted: ? Est ms sediento que lo habitual. ? Est ms cansado que lo habitual.  Tiene los siguientes sntomas: ? Sntomas nuevos. ? Dolor en el vientre (abdomen). ? Fiebre. ? Deposiciones lquidas (diarrea). SOLICITE AYUDA DE INMEDIATO SI:  La erupcin cubre todo el cuerpo o la mayor parte de [place]. La erupcin puede ser dolorosa o no.  Tiene ampollas con las siguientes caractersticas: ? Se ubican sobre la erupcin. ? Se agrandan. ? Crecen juntas. ? Son dolorosas. ? Estn dentro de la nariz o la boca.  Tiene una erupcin cutnea con estas caractersticas: ? Tiene pequeas manchas moradas, como si fueran pinchazos, en todo el cuerpo. ? Tiene un aspecto parecido a Earna Coder o a un blanco de tiro. ? Est enrojecida y le duele, le produce descamacin de la piel y no se relaciona con haber estado mucho tiempo bajo el sol. Esta informacin no tiene Marine scientist el consejo del mdico. Asegrese de hacerle al mdico cualquier pregunta que tenga. Document Released: 02/09/2009 Document Revised: 03/06/2016 Document Reviewed: 03/31/2015 Elsevier Interactive Patient Education  2018 [employer].

## 2018-08-23 NOTE — Progress Notes (Signed)
Subjective:  Patient ID: Chloe Perez, female    DOB: Jul 04, 1976  Age: 42 y.o. MRN: [id number]  CC: F/u DM2   HPI Chloe Perez is a 42 y.o. female with a medical history of DM2 presents to f/u on DM2. She gave birth to a premature infant two months ago and is currently breast feeding. Takes metformin as directed. A1c today 8.1%. Denies polyuria, polydipsia, fatigue, tingling, numbness, or visual blurring. Denies any other symptoms or complaints.       Outpatient Medications Prior to Visit  Medication Sig Dispense Refill  . metFORMIN (GLUCOPHAGE) 1000 MG tablet Take 1 tablet (1,000 mg total) by mouth 2 (two) times daily with a meal. 180 tablet 3  . Prenat-Fe Poly-Methfol-FA-DHA (VITAFOL ULTRA) 29-0.6-0.4-200 MG CAPS Take 1 tablet by mouth daily. 30 capsule 12  . glimepiride (AMARYL) 2 MG tablet Take 1 tablet (2 mg total) by mouth daily with breakfast. (Patient not taking: Reported on 06/25/2018) 30 tablet 2  . ibuprofen (ADVIL,MOTRIN) 600 MG tablet Take 1 tablet (600 mg total) by mouth every 6 (six) hours as needed. (Patient not taking: Reported on 06/25/2018) 30 tablet 1  . oxyCODONE-acetaminophen (PERCOCET/ROXICET) 5-325 MG tablet Take 1 tablet by mouth every 4 (four) hours as needed (pain scale 4-7). (Patient not taking: Reported on 06/25/2018) 30 tablet 0   No facility-administered medications prior to visit.      ROS Review of Systems  Constitutional: Negative for chills, fever and malaise/fatigue.  Eyes: Negative for blurred vision.  Respiratory: Negative for shortness of breath.   Cardiovascular: Negative for chest pain and palpitations.  Gastrointestinal: Negative for abdominal pain and nausea.  Genitourinary: Negative for dysuria and hematuria.  Musculoskeletal: Negative for joint pain and myalgias.  Skin: Negative for rash.  Neurological: Negative for tingling and headaches.  Psychiatric/Behavioral: Negative for depression. The patient is not  nervous/anxious.     Objective:  BP (!) 143/78 (BP Location: Right Arm, Patient Position: Sitting, Cuff Size: Normal)   Pulse 79   Temp 97.9 F (36.6 C) (Oral)   Ht 5\' 2"  (1.575 m)   Wt 145 lb (65.8 kg)   LMP 08/09/2018 (Approximate)   SpO2 98%   Breastfeeding? Yes   BMI 26.52 kg/m   BP/Weight 08/23/2018 6/56/8127 03/27/7000  Systolic BP 749 449 675  Diastolic BP 78 62 63  Wt. (Lbs) 145 143.6 159.08  BMI 26.52 28.04 31.07      Physical Exam  Constitutional: She is oriented to person, place, and time.  Well developed, well nourished, NAD, polite  HENT:  Head: Normocephalic and atraumatic.  Eyes: No scleral icterus.  Neck: Normal range of motion. Neck supple. No thyromegaly present.  Cardiovascular: Normal rate, regular rhythm and normal heart sounds.  Pulmonary/Chest: Effort normal and breath sounds normal.  Abdominal: Soft. Bowel sounds are normal. There is no tenderness.  Musculoskeletal: She exhibits no edema.  Neurological: She is alert and oriented to person, place, and time.  Skin: Skin is warm and dry. No rash noted. No erythema. No pallor.  Psychiatric: She has a normal mood and affect. Her behavior is normal. Thought content normal.  Vitals reviewed.    Assessment & Plan:    1. Rash and nonspecific skin eruption - Begin triamcinolone ointment (KENALOG) 0.5 %; Apply 1 application topically 2 (two) times daily.  Dispense: 30 g; Refill: 0  2. Type 2 diabetes mellitus with complication, without long-term current use of insulin (HCC) - HgB A1c 8.1% - May continue taking metFORMIN (GLUCOPHAGE)  1000 MG tablet; Take 1 tablet (1,000 mg total) by mouth 2 (two) times daily with a meal.  Dispense: 180 tablet; Refill: 3 - Not safe to use other anti-glycemics as they are transferred through breast milk. Pt to conduct routine pediatric testing for her infant son.   Meds ordered this encounter  Medications  . triamcinolone ointment (KENALOG) 0.5 %    Sig: Apply 1  application topically 2 (two) times daily.    Dispense:  30 g    Refill:  0    Order Specific Question:   Supervising Provider    Answer:   Charlott Rakes [[id number]]  . metFORMIN (GLUCOPHAGE) 1000 MG tablet    Sig: Take 1 tablet (1,000 mg total) by mouth 2 (two) times daily with a meal.    Dispense:  180 tablet    Refill:  3    Order Specific Question:   Supervising Provider    Answer:   Charlott Rakes [[id number]]    Follow-up: Return in about 3 months (around 11/22/2018) for DM.   Clent Demark PA

## 2018-10-04 IMAGING — US US MFM OB DETAIL+14 WK
1 series · 13 of 28 positions shown · non-contrast
Comparison: none

[Series 1: us mfm ob detail+14 wk · 95 acquisitions, 13 frames shown]
[im 4/95]
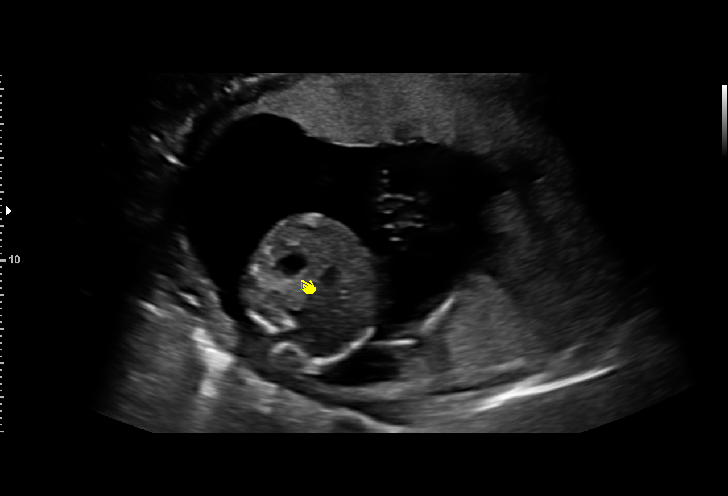
[im 11/95]
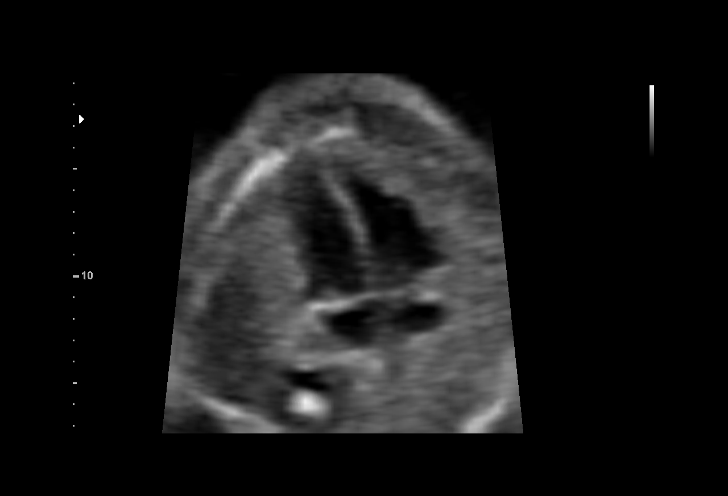
[im 18/95]
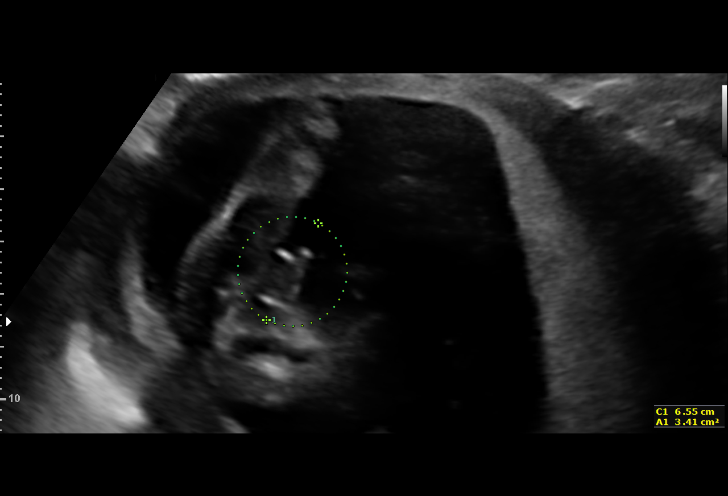
[im 25/95]
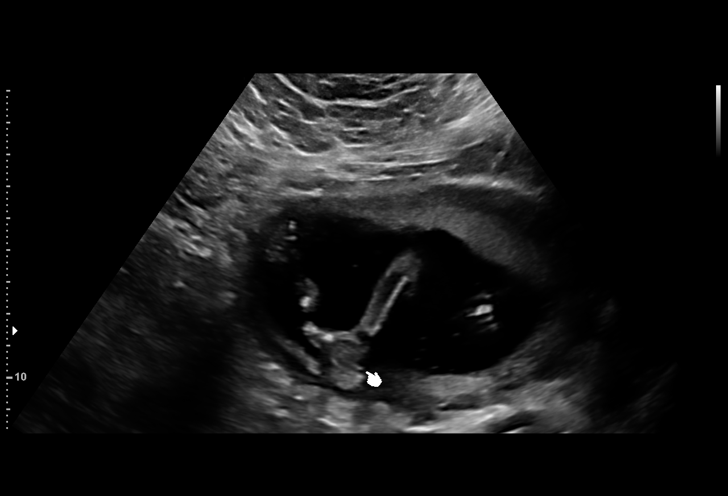
[im 32/95]
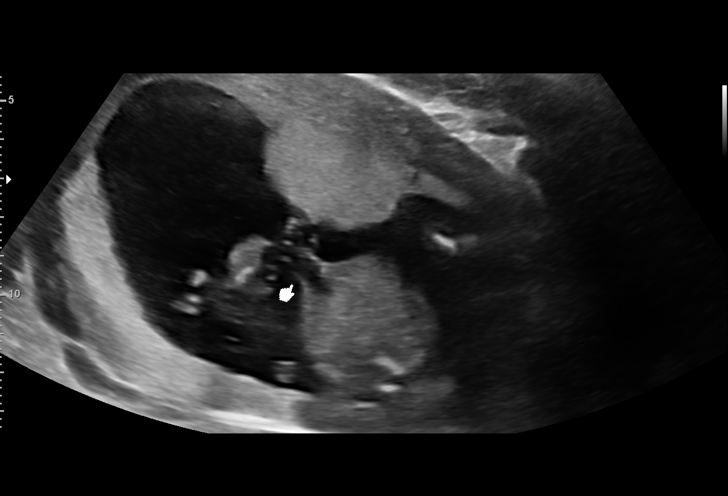
[im 39/95]
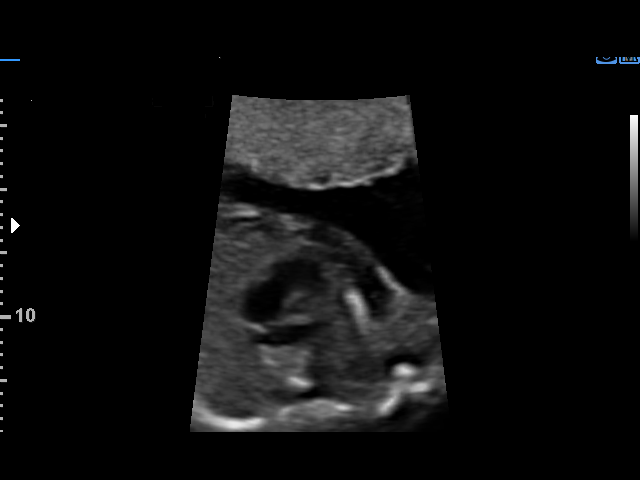
[im 49/95]
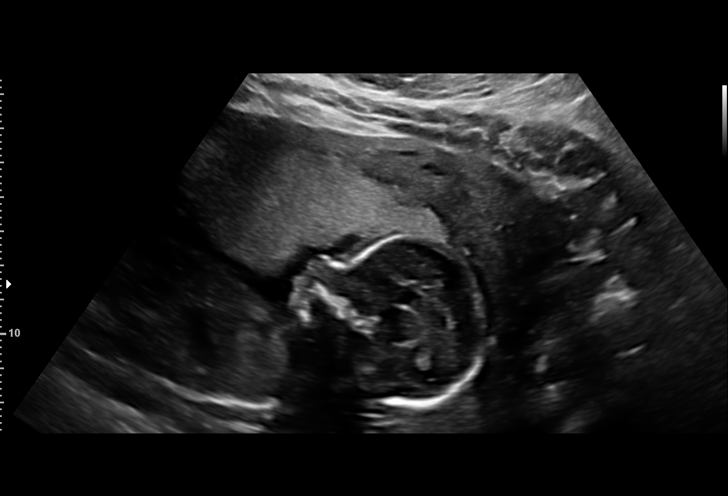
[im 56/95]
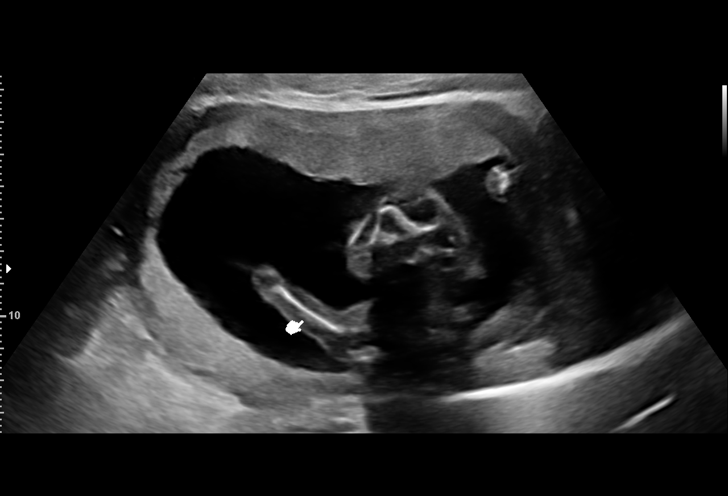
[im 63/95]
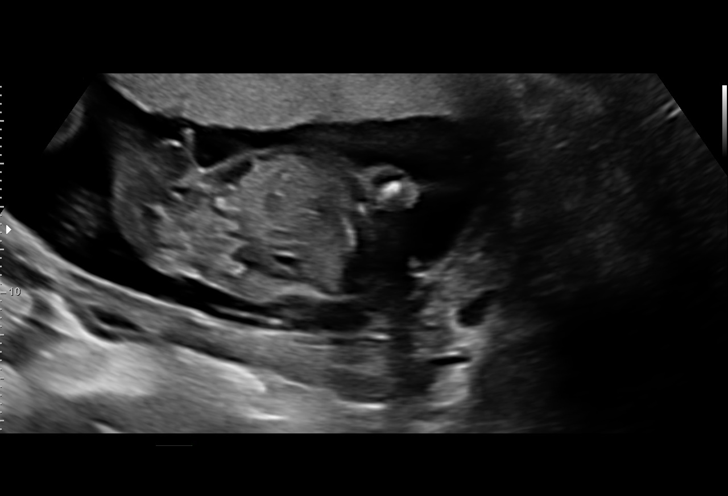
[im 70/95]
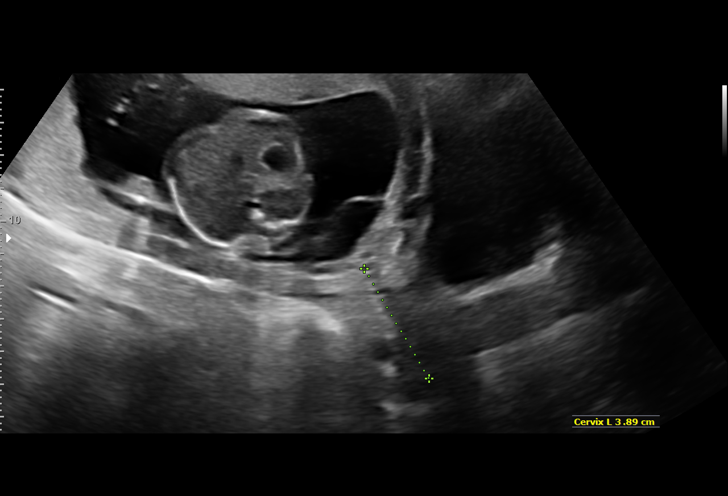
[im 77/95]
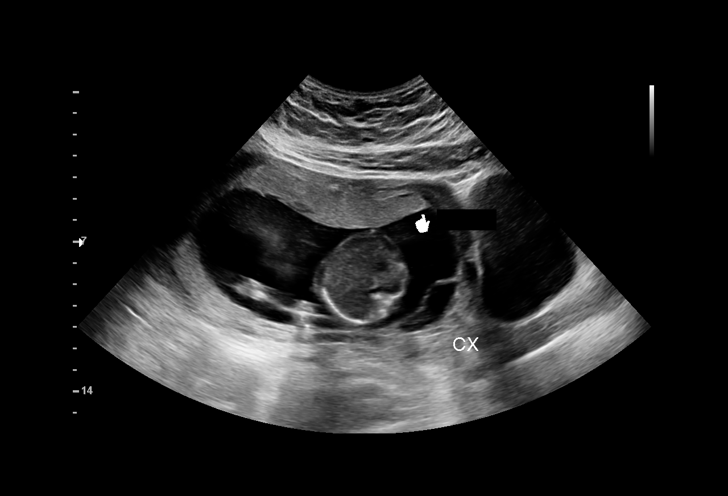
[im 84/95]
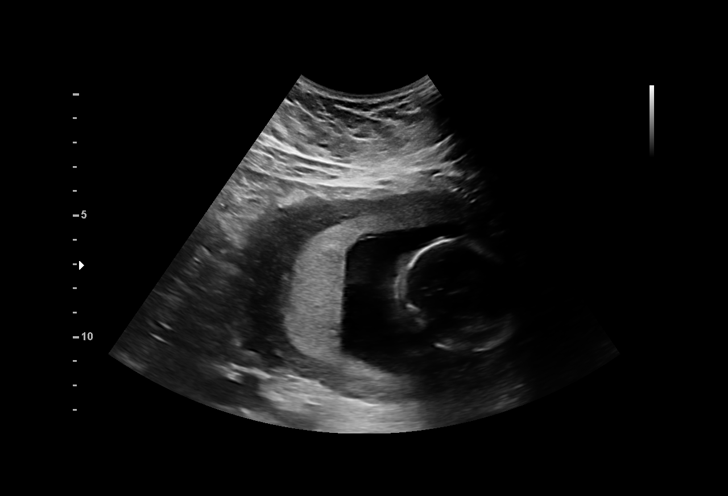
[im 91/95]
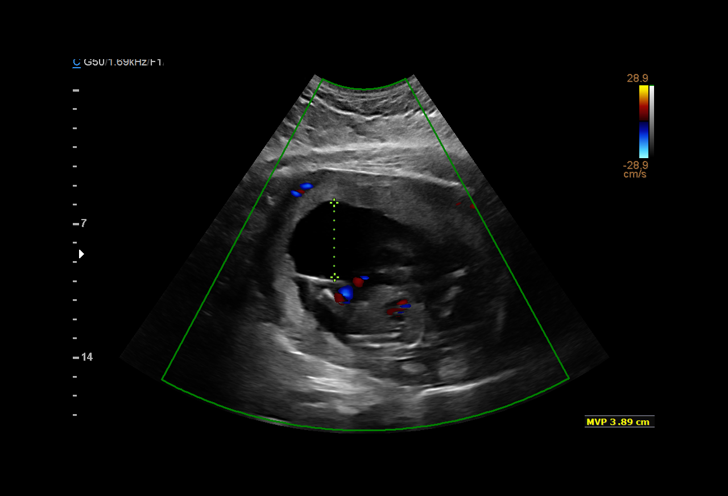

[13 of 28 positions shown; findings below may reference images not displayed]

[REDACTED] [HOSPITAL]

Indications

18 weeks gestation of pregnancy
Advanced maternal age multigravida 35+,
second trimester
Vaginal bleeding in pregnancy, second
trimester
Pre-existing diabetes, type 2, in pregnancy,
second trimester (metformin and glimepiride)
Encounter for fetal anatomic survey
Pregnancy resulting from assisted
reproductive technology (Clomid)
OB History

Blood Type:            Height:  5'2"   Weight (lb):  153       BMI:
Gravidity:    2         Term:   0        Prem:   0        SAB:   1
Fetal Evaluation

Num Of Fetuses:     1
Fetal Heart         151
Rate(bpm):
Cardiac Activity:   Observed
Presentation:       Variable
Placenta:           Anterior, Rt Lat above cervical os
P. Cord Insertion:  Visualized

Amniotic Fluid
AFI FV:      Subjectively within normal limits

Largest Pocket(cm)
3.89
Biometry

BPD:      43.7  mm     G. Age:  19w 1d         77  %    CI:        83.07   %    70 - 86
FL/HC:      16.5   %    16.1 -
HC:      151.2  mm     G. Age:  18w 1d         23  %    HC/AC:      1.08        1.09 -
AC:      140.4  mm     G. Age:  19w 3d         74  %    FL/BPD:     57.0   %
FL:       24.9  mm     G. Age:  17w 4d         12  %    FL/AC:      17.7   %    20 - 24
HUM:      25.3  mm     G. Age:  18w 0d         34  %
CER:      20.3  mm     G. Age:  19w 2d         71  %
NFT:       3.8  mm
CM:          4  mm

Est. FW:     244  gm      0 lb 9 oz     45  %
Gestational Age

LMP:           25w 2d        Date:  08/28/17                 EDD:   06/04/18
U/S Today:     18w 4d                                        EDD:   07/21/18
Best:          18w 4d     Det. By:  U/S (02/21/18)           EDD:   07/21/18
Anatomy

Cranium:               Appears normal         Aortic Arch:            Appears normal
Cavum:                 Appears normal         Ductal Arch:            Appears normal
Ventricles:            Appears normal         Diaphragm:              Appears normal
Choroid Plexus:        Appears normal         Stomach:                Appears normal, left
sided
Cerebellum:            Appears normal         Abdomen:                Appears normal
Posterior Fossa:       Appears normal         Abdominal Wall:         Appears nml (cord
insert, abd wall)
Nuchal Fold:           Appears normal         Cord Vessels:           Appears normal (3
vessel cord)
Face:                  Appears normal         Kidneys:                Appear normal
(orbits and profile)
Lips:                  Not well visualized    Bladder:                Appears normal
Thoracic:              Appears normal         Spine:                  Not well visualized
Heart:                 Appears normal         Upper Extremities:      Appears normal
(4CH, axis, and situs
RVOT:                  Appears normal         Lower Extremities:      Appears normal
LVOT:                  Not well visualized

Other:  Fetus appears to be a male. Heels visualized. 5th digit not well
visualized.  Nasal bone visualized.  Open hands visualized.
Cervix Uterus Adnexa

Cervix
Length:           3.89  cm.
Normal appearance by transabdominal scan.

Uterus
No abnormality visualized.

Left Ovary
Size(cm)       4.7  x   2.5    x  3.3       Vol(ml):
Within normal limits.

Right Ovary
Not visualized.

Comment:      Bleed seen from placental tip to cervix.
Appears to cover internal os.
Impression

Singleton intrauterine pregnancy at 18+4 weeks with AMA
and type 2 DM here for anatomic survey
Review of the anatomy shows no sonographic markers for
aneuploidy or structural anomalies
However, views of the fetal heart and spine should be
considered suboptimal secondary to fetal position
Amniotic fluid volume is normal
Estimated fetal weight shows growth in the 45th percentile
Recommendations

Recommend follow-up ultrasound examination in 4 weeks for
completion of the anatomic survey and growth
A growth scan should be performed every 4 weeks
Antepartum testing should be started at 30-32 weeks and
continued through delivery
Fetal echocardiology will be arranged through our office

## 2018-11-21 ENCOUNTER — Ambulatory Visit (INDEPENDENT_AMBULATORY_CARE_PROVIDER_SITE_OTHER): Payer: Self-pay | Admitting: Physician Assistant

## 2018-12-11 IMAGING — US US FETAL BPP W/O NONSTRESS
1 series · 13 of 15 positions shown · non-contrast
Comparison: none

[Series 1: us fetal bpp wo non stress · 15 acquisitions, 13 frames shown]
[im 1/15]
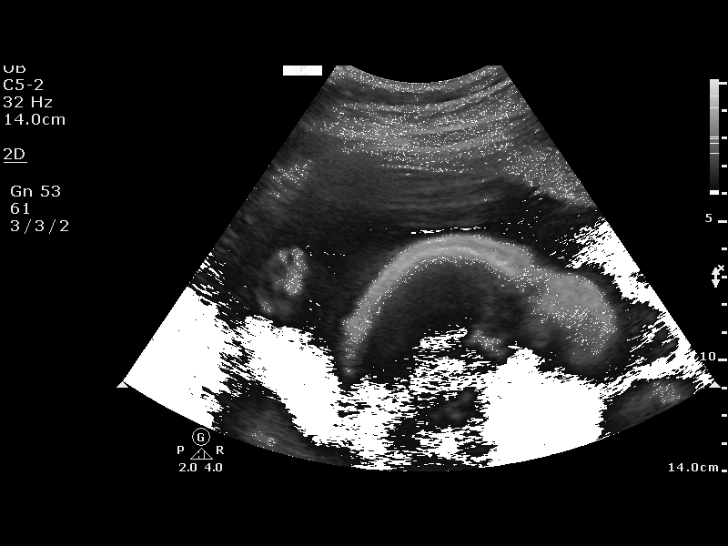
[im 2/15]
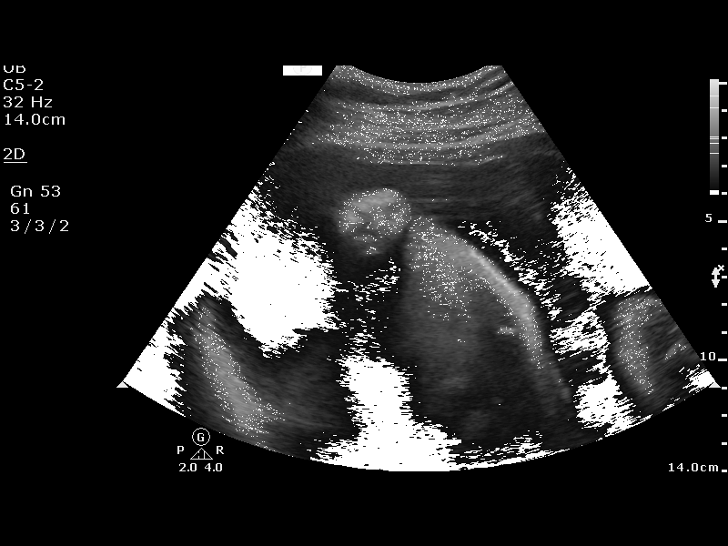
[im 3/15]
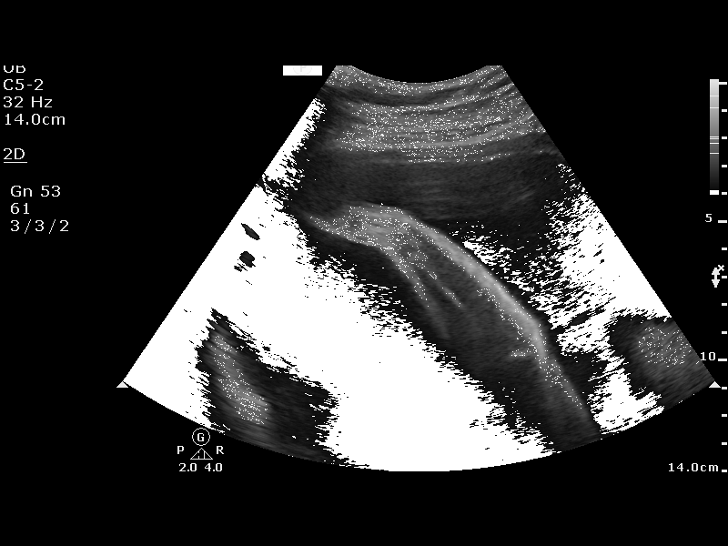
[im 5/15]
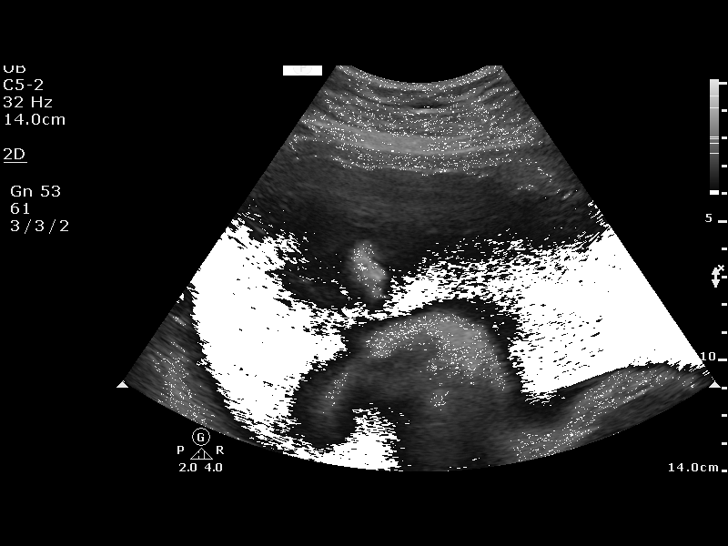
[im 6/15]
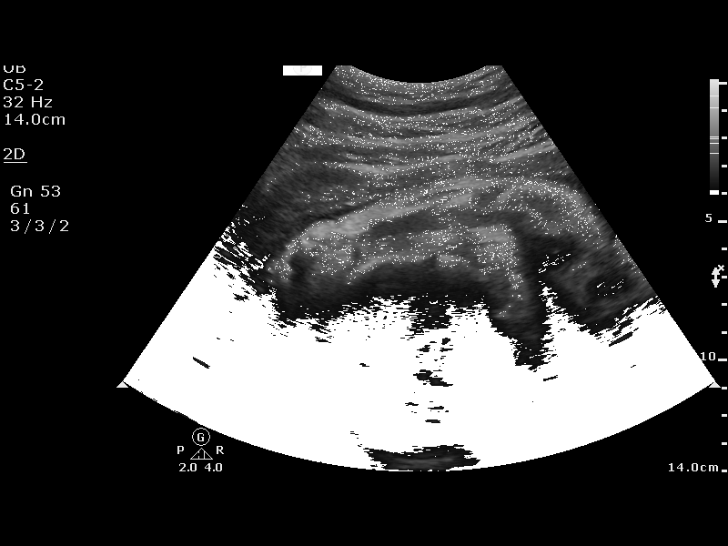
[im 7/15]
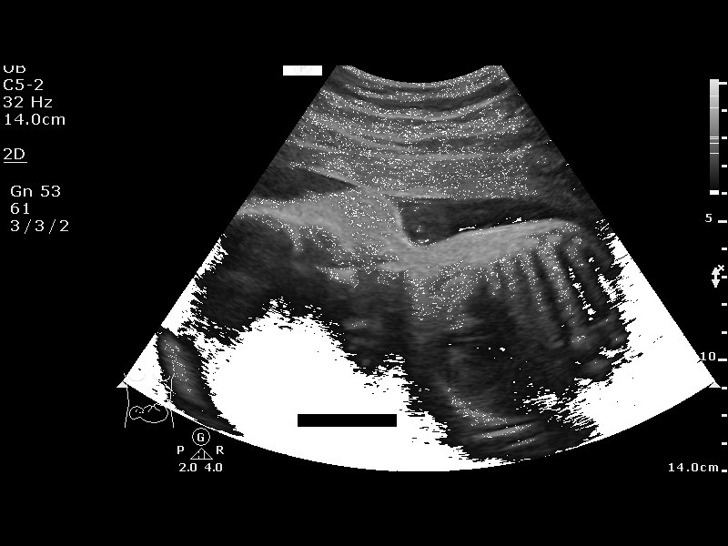
[im 8/15]
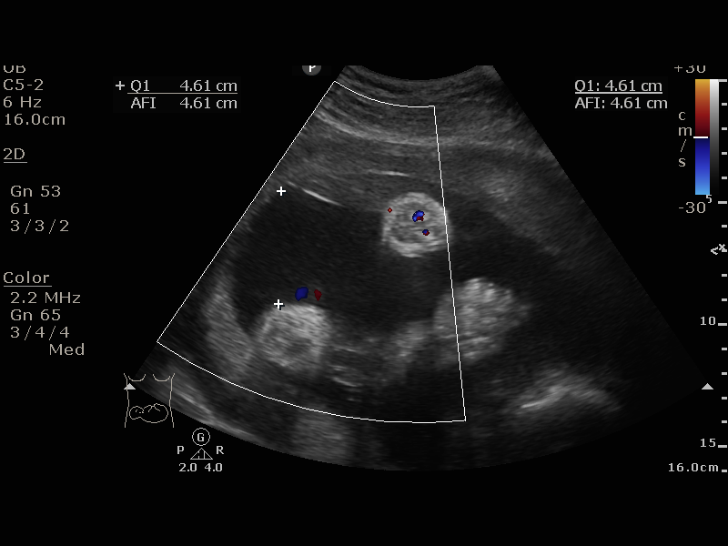
[im 9/15]
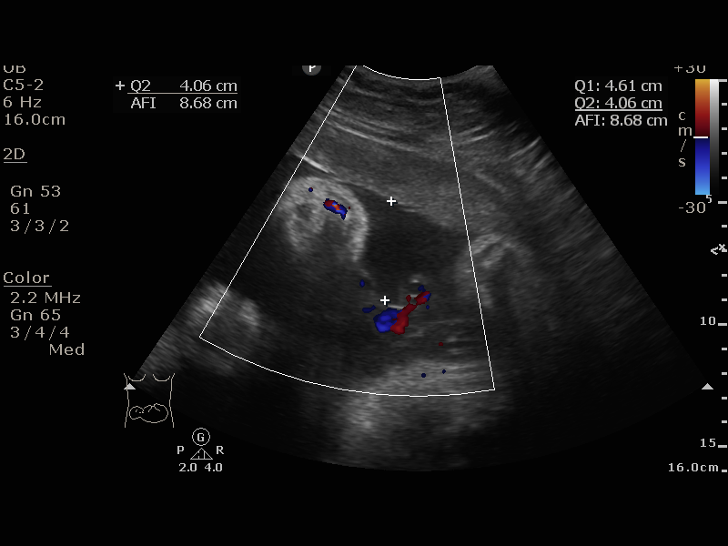
[im 10/15]
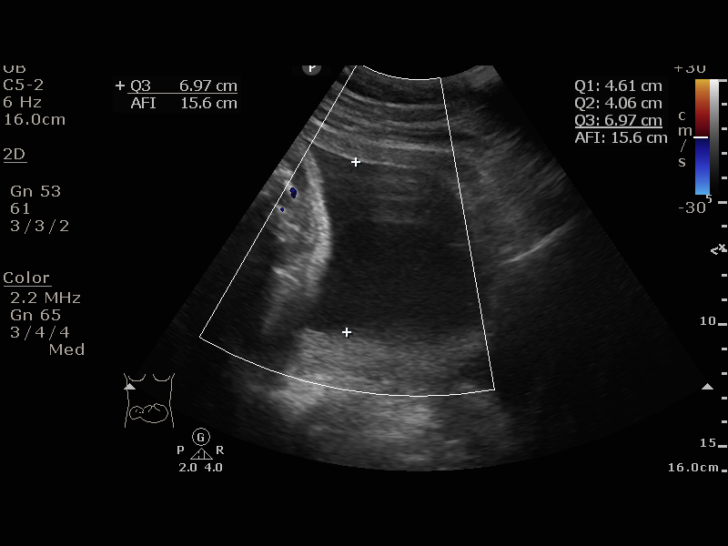
[im 11/15]
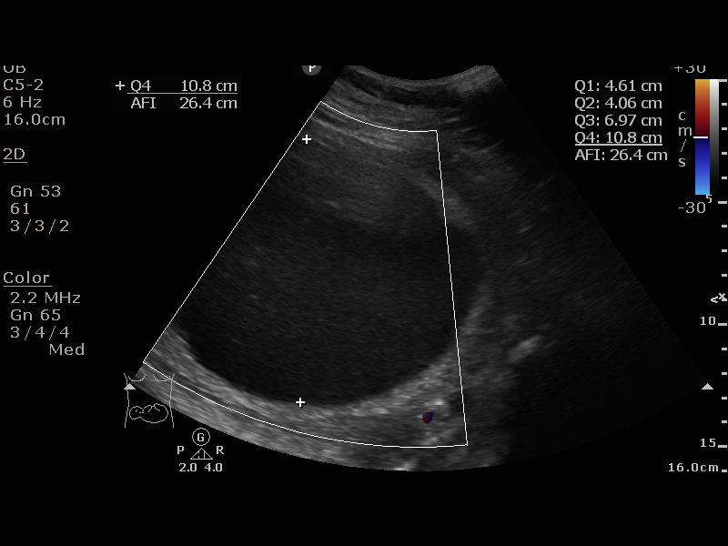
[im 13/15]
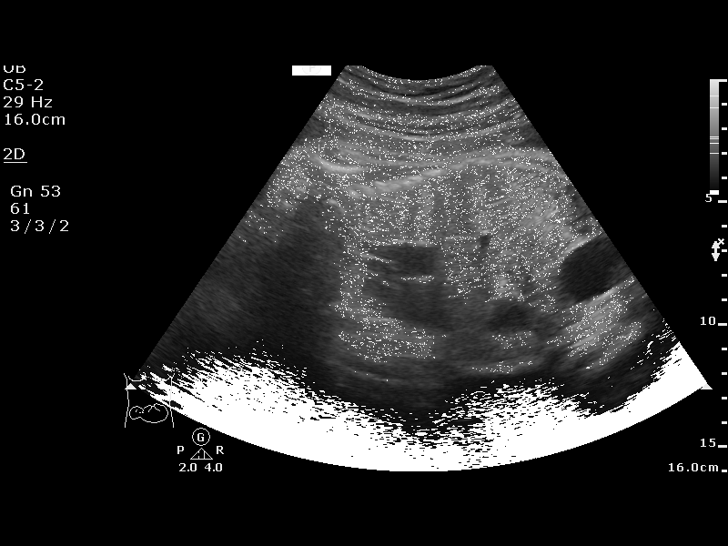
[im 14/15]
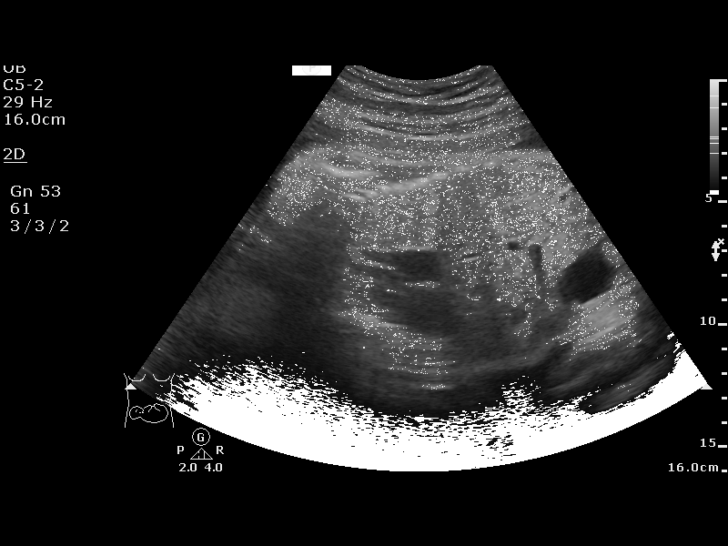
[im 15/15]
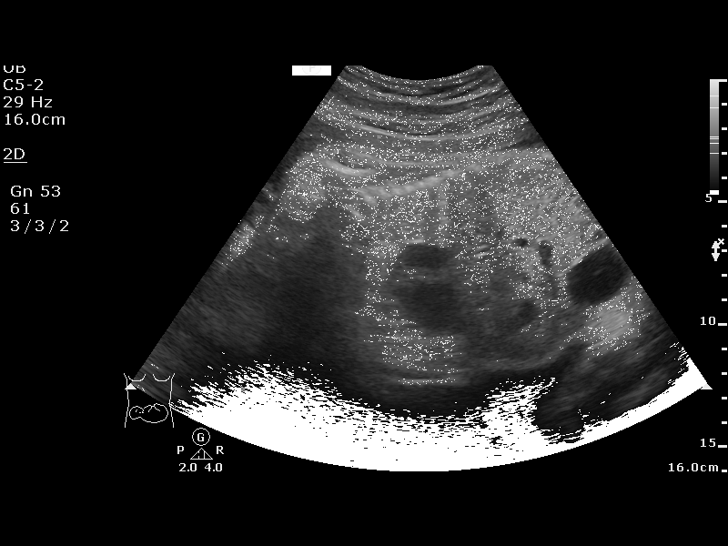

[13 of 15 positions shown; findings below may reference images not displayed]

[REDACTED]
[REDACTED]

1  DORICAH           [id number]      [id number]     [id number]
Service(s) Provided

Indications

28 weeks gestation of pregnancy
Pre-existing diabetes, type 2, in pregnancy,
third trimester
OB History

Blood Type:            Height:  5'2"   Weight (lb):  153       BMI:
Gravidity:    2         Term:   0        Prem:   0        SAB:   1
Fetal Evaluation

Num Of Fetuses:     1
Preg. Location:     Intrauterine
Cardiac Activity:   Observed
Presentation:       Transverse, head to maternal right

Amniotic Fluid
AFI FV:      Polyhydramnios

AFI Sum(cm)     %Tile       Largest Pocket(cm)
26.44           > 97

RUQ(cm)       RLQ(cm)       LUQ(cm)        LLQ(cm)
4.61
Biophysical Evaluation

Amniotic F.V:   Pocket => 2 cm two         F. Tone:        Observed
planes
F. Movement:    Observed                   Score:          [DATE]
F. Breathing:   Observed
Gestational Age

LMP:           35w 0d        Date:  08/28/17                 EDD:   06/04/18
Best:          28w 2d     Det. By:  U/S  (02/21/18)          EDD:   07/21/18
Impression

Viable fetus in transverse position
Polyhydramnios
Recommendations

Continue antenatal testing and serial ultrasound for fetal
growth.

## 2019-01-08 IMAGING — US US FETAL BPP W/ NON-STRESS
1 series · 11 of 11 positions shown · non-contrast
Comparison: none

[Series 1: us fetal bpp w/nonstress · 11 acquisitions, 11 frames shown]
[im 1/11]
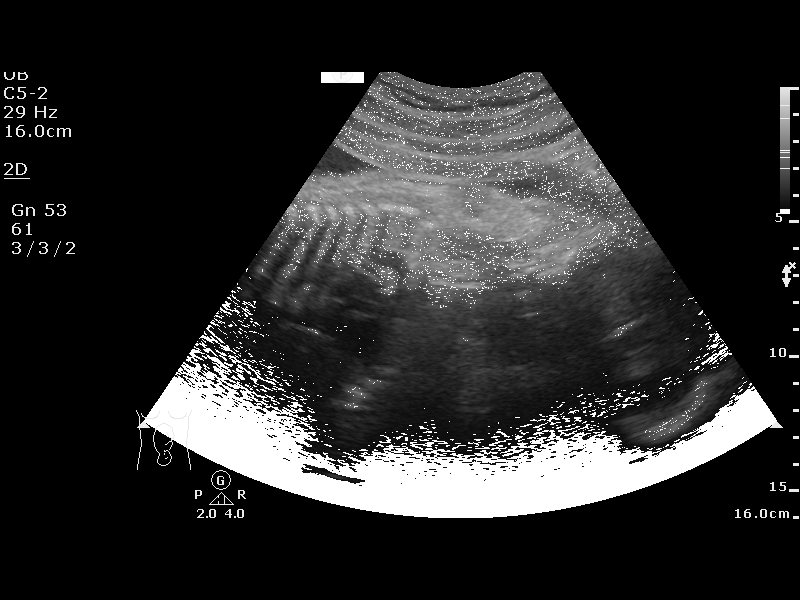
[im 2/11]
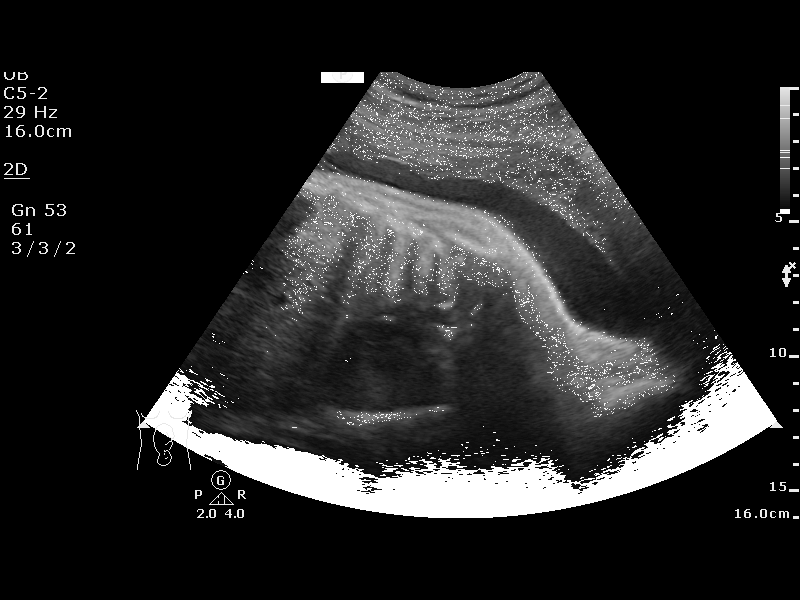
[im 3/11]
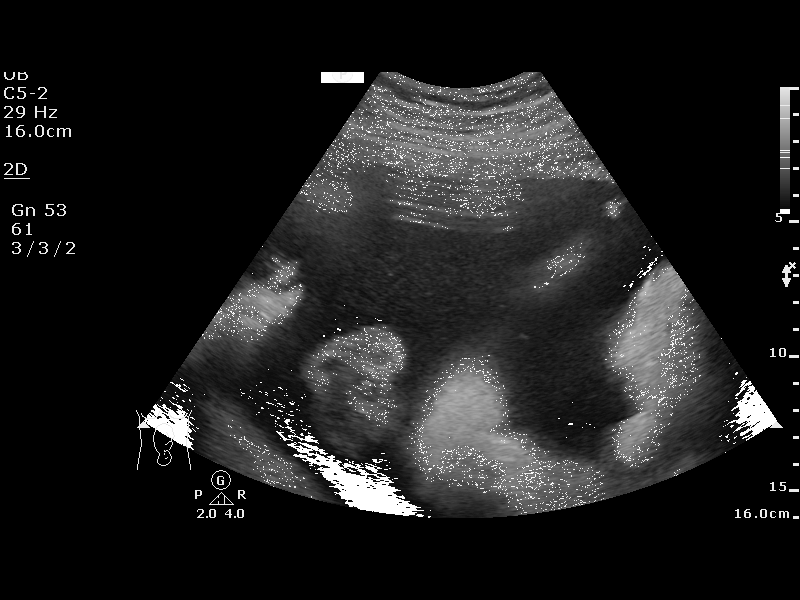
[im 4/11]
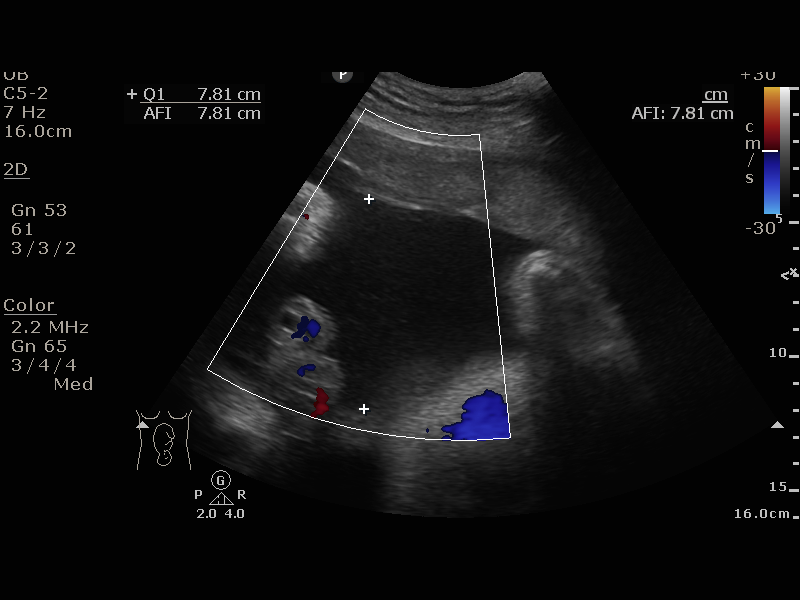
[im 5/11]
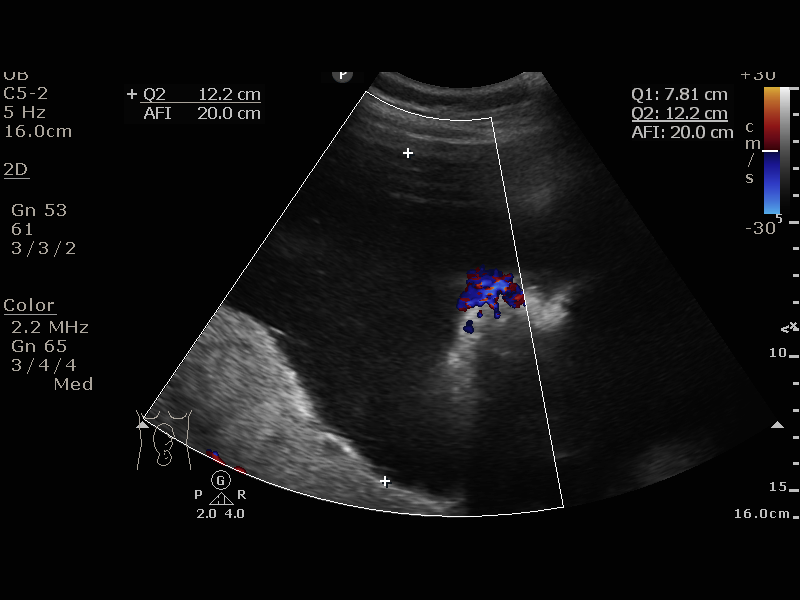
[im 6/11]
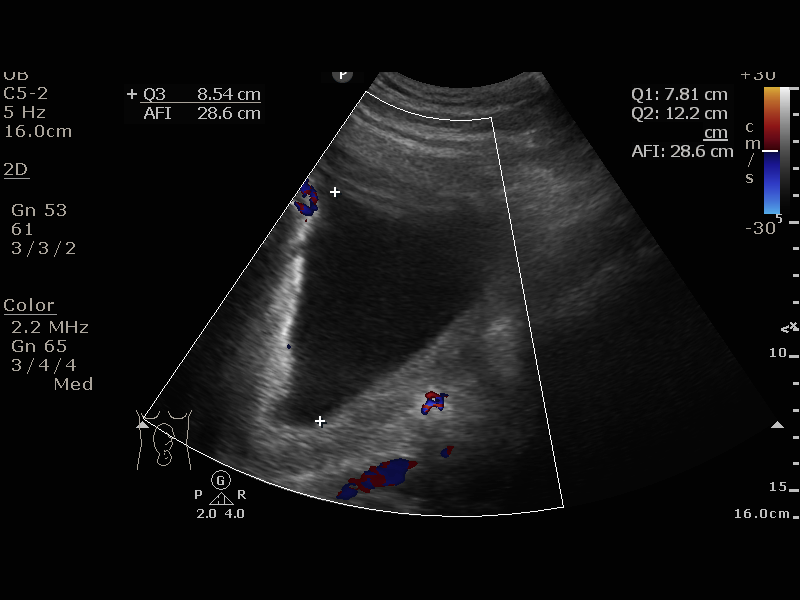
[im 7/11]
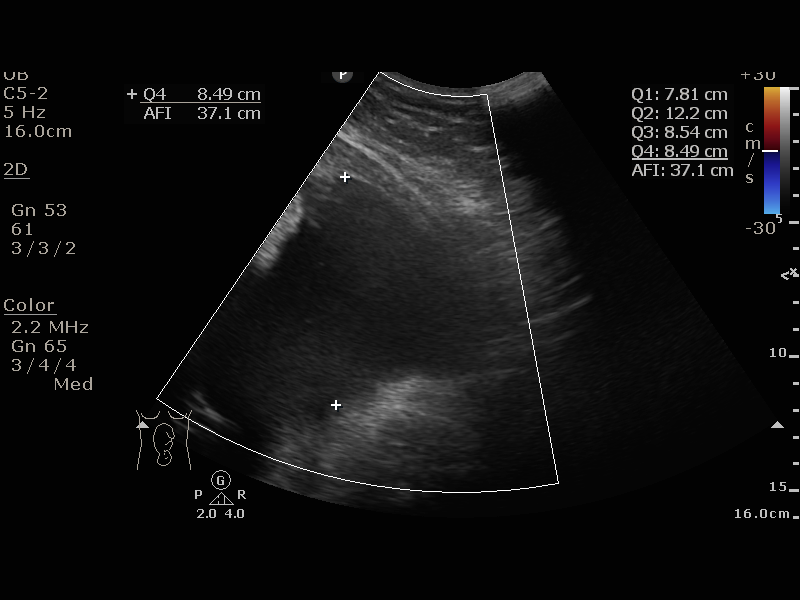
[im 8/11]
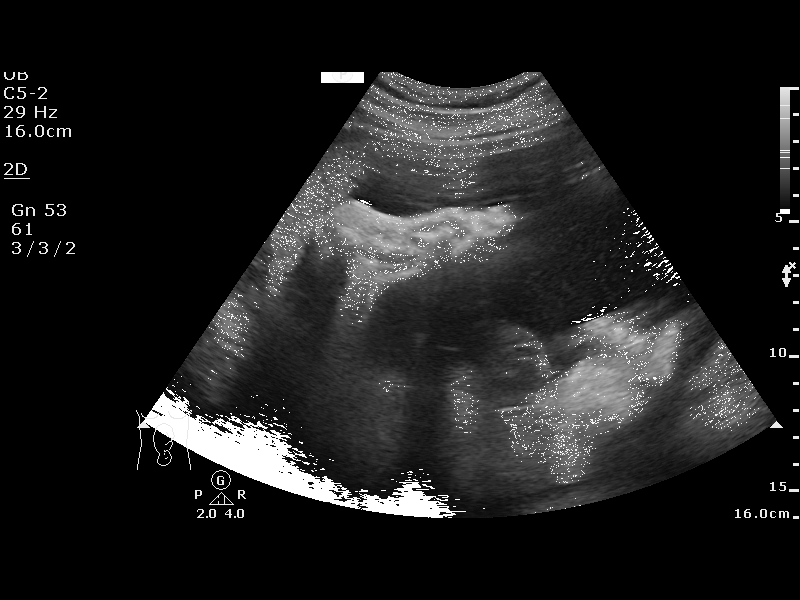
[im 9/11]
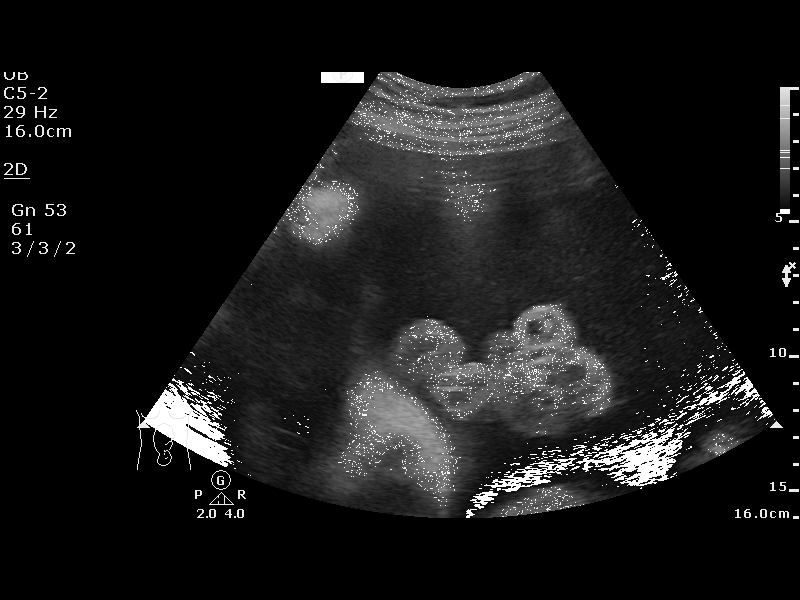
[im 10/11]
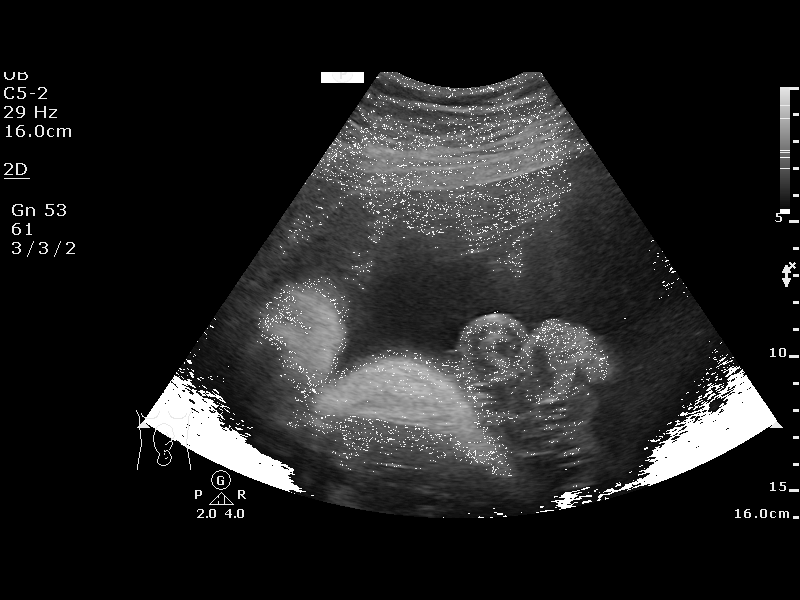
[im 11/11]
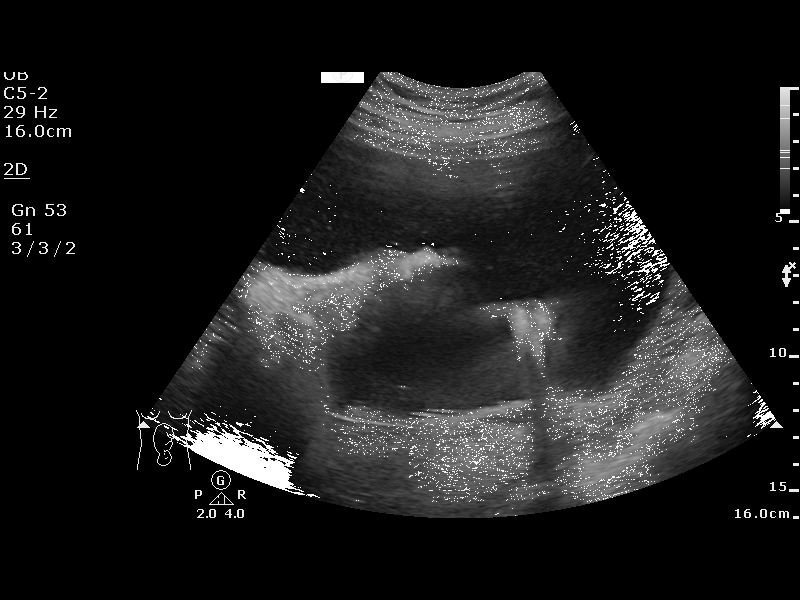

[11 of 11 positions shown; findings below may reference images not displayed]

pm)

[facility]AN[facility]

[facility]
[REDACTED]

1  US FETAL BPP W/NONSTRESS                    76818.4

1  RUDI JUMPER              [id number]      [id number]     [id number]
Service(s) Provided

Indications

32 weeks gestation of pregnancy
Polyhydramnios, third trimester, antepartum
condition or complication, unspecified fetus
Pre-existing diabetes, type 2, in pregnancy,
third trimester
OB History

Blood Type:            Height:  5'2"   Weight (lb):  153       BMI:
Gravidity:    2         Term:   0        Prem:   0        SAB:   1
Fetal Evaluation

Num Of Fetuses:     1
Preg. Location:     Intrauterine
Cardiac Activity:   Observed
Presentation:       Cephalic
Amniotic Fluid
AFI FV:      Polyhydramnios

AFI Sum(cm)     %Tile       Largest Pocket(cm)
37.04           > 97

RUQ(cm)       RLQ(cm)       LUQ(cm)        LLQ(cm)
7.81
Biophysical Evaluation

Amniotic F.V:   Pocket => 2 cm two         F. Tone:        Observed
planes
F. Movement:    Observed                   N.S.T:          Nonreactive
F. Breathing:   Not Observed               Score:          [DATE]
Gestational Age

LMP:           39w 0d        Date:  08/28/17                 EDD:   06/04/18
Best:          32w 2d     Det. By:  U/S  (02/21/18)          EDD:   07/21/18
Impression

IUP at 06w6d
Increased amniotic fluid volume consistent with
polyhydramnios
Nonreassuring BPP of [DATE]
Recommendations

Needs prolonged EFM monitoring and possible repeat BPP

## 2023-01-31 ENCOUNTER — Ambulatory Visit: Payer: Self-pay | Admitting: Internal Medicine

## 2023-08-06 ENCOUNTER — Ambulatory Visit (HOSPITAL_COMMUNITY)
Admission: EM | Admit: 2023-08-06 | Discharge: 2023-08-06 | Disposition: A | Discharge status: Home / Self Care | Payer: Self-pay | Attending: Internal Medicine | Admitting: Internal Medicine

## 2023-08-06 ENCOUNTER — Encounter (HOSPITAL_COMMUNITY): Payer: Self-pay

## 2023-08-06 DIAGNOSIS — E0865 Diabetes mellitus due to underlying condition with hyperglycemia: Secondary | ICD-10-CM

## 2023-08-06 DIAGNOSIS — R079 Chest pain, unspecified: Secondary | ICD-10-CM

## 2023-08-06 LAB — POCT FASTING CBG KUC MANUAL ENTRY: POCT Glucose (KUC): 236 mg/dL — AB (ref 70–99)

## 2023-08-06 NOTE — Discharge Instructions (Addendum)
Please go to the emergency department for further evaluation of chest pain.

## 2023-08-06 NOTE — ED Triage Notes (Signed)
Patient here today with c/o her glucose being elevated over the past 6 months. Patient is diabetic and takes Glimepiride and Metformin. She states that she has only been taking the Glimepiride in the morning. She has not been taking her Metformin because she has been concerned with how it affects kidneys. She also notes that she has been having headaches and some chest pains for about 4 days. She has been under a lot of stress lately.

## 2023-08-06 NOTE — ED Provider Notes (Signed)
[facility]    CSN: [id number] Arrival date & time: 08/06/23  1403      History   Chief Complaint Chief Complaint  Patient presents with   Hyperglycemia    HPI Chloe Perez is a 47 y.o. female with a history of diabetes mellitus type 2 on glimepiride comes to urgent care complaining of elevated blood glucose.  Blood sugars this morning was in the 400s.  Patient was originally prescribed metformin and glimepiride for diabetes control but patient has been taking only glimepiride because she is concerned about the effect of metformin on the kidneys.  Patient is currently experiencing blurry vision, polyuria, polydipsia but denies any polyphagia.  Patient complains of intermittent precordial chest pain that is associated with heaviness in the left arm.  This started a week ago.  She denies any aggravating or relieving factors.  It is associated with dizziness.  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 Authorizing Provider  metFORMIN (GLUCOPHAGE) 1000 MG tablet Take 1 tablet (1,000 mg total) by mouth 2 (two) times daily with a meal. 08/23/18   Loletta Specter, PA-C    Family History Family History  Problem Relation Age of Onset   Diabetes Other    Cancer Other    Hypertension Other    Heart disease Other    Diabetes Mother    Diabetes Father     Social History Social History   Tobacco Use   Smoking status: Some Days    Types: Cigarettes   Smokeless tobacco: Never  Vaping Use   Vaping status: Never Used  Substance Use Topics   Alcohol use: Yes    Comment: sometimes   Drug use: No     Allergies   Patient has no known allergies.   Review of Systems Review of Systems As per HPI  Physical Exam Triage Vital Signs ED Triage Vitals  Encounter Vitals Group     BP 08/06/23 1529 (!) 166/94     Systolic BP Percentile --      Diastolic BP Percentile --  Pulse Rate 08/06/23 1529 83     Resp 08/06/23 1529 16     Temp 08/06/23 1529 98.9 F (37.2 C)     Temp Source 08/06/23 1529 Oral     SpO2 --      Weight --      Height 08/06/23 1529 5' 0.63" (1.54 m)     Head Circumference --      Peak Flow --      Pain Score 08/06/23 1525 6     Pain Loc --      Pain Education --      Exclude from Growth Chart --    No data found.  Updated Vital Signs BP (!) 166/94 (BP Location: Left Arm)   Pulse 83   Temp 98.9 F (37.2 C) (Oral)   Resp 16   Ht 5' 0.63" (1.54 m)   LMP 08/05/2023 (Exact Date)   Breastfeeding No   BMI 27.73 kg/m   Visual Acuity Right Eye Distance:   Left Eye Distance:   Bilateral Distance:    Right Eye Near:   Left Eye Near:    Bilateral Near:     Physical Exam Vitals and nursing note reviewed.  Constitutional:      Appearance: She is not ill-appearing.  Cardiovascular:     Rate and Rhythm: Normal rate and  regular rhythm.     Pulses: Normal pulses.     Heart sounds: Normal heart sounds.  Pulmonary:     Effort: Pulmonary effort is normal.     Breath sounds: Normal breath sounds.  Neurological:     Mental Status: She is alert.      UC Treatments / Results  Labs (all labs ordered are listed, but only abnormal results are displayed) Labs Reviewed  POCT FASTING CBG [facility] MANUAL ENTRY - Abnormal; Notable for the following components:      Result Value   POCT Glucose ([facility]) 236 (*)    All other components within normal limits    EKG   Radiology No results found.  Procedures Procedures (including critical care time)  Medications Ordered in UC Medications - No data to display  Initial Impression / Assessment and Plan / UC Course  I have reviewed the triage vital signs and the nursing notes.  Pertinent labs & imaging results that were available during my care of the patient were reviewed by me and considered in my medical decision making (see chart for details).     1.  Diabetes mellitus type 2, uncontrolled: Patient is not compliant with oral hypoglycemic agents Blood sugar in the urgent care is 236 Patient is advised to be compliant with her medications. She will need some basic blood work which can be done in the emergency department  2.  Precordial chest pain in a diabetic who is hypertensive-concern for unstable angina: EKG shows normal sinus rhythm with T wave inversions in lead III and T wave flattening in aVF-this is different from EKG done in 2017.  No previous cardiac workup Patient is advised to go to the emergency department for further evaluation. Final Clinical Impressions(s) / UC Diagnoses   Final diagnoses:  Chest pain, unspecified type  Diabetes mellitus due to underlying condition, uncontrolled, with hyperglycemia [facility])     Discharge Instructions      Please go to the emergency department for further evaluation of chest pain.     ED Prescriptions    None    PDMP not reviewed this encounter.   Demaris Callander  O, MD 08/06/23 [phone]

## 2023-08-06 NOTE — ED Notes (Signed)
Patient is being discharged from the Urgent Care and sent to the Emergency Department via self . Per provider, patient is in need of higher level of care due to chest pains, headache. Patient is aware and verbalizes understanding of plan of care.  Vitals:   08/06/23 1529  BP: (!) 166/94  Pulse: 83  Resp: 16  Temp: 98.9 F (37.2 C)

## 2023-10-01 ENCOUNTER — Ambulatory Visit: Payer: Self-pay | Admitting: Internal Medicine

## 2023-10-17 ENCOUNTER — Telehealth: Payer: Self-pay

## 2023-10-17 NOTE — Telephone Encounter (Signed)
Patient would like for her new patient appointment to be sooner,  Patient has pain on her lower abdomen , when coughing pain is worst, patient stated when coughing she also feels like a mass .   Patient has been feeling like this for past 3 weeks.   We will call patient if there is a cancellation.

## 2023-10-22 NOTE — Telephone Encounter (Signed)
Patient has been scheduled

## 2023-10-24 ENCOUNTER — Ambulatory Visit: Payer: Self-pay | Admitting: Internal Medicine

## 2023-10-24 ENCOUNTER — Encounter: Payer: Self-pay | Admitting: Internal Medicine

## 2023-10-24 VITALS — BP 120/62 | HR 81 | Resp 16 | Ht 59.0 in | Wt 151.0 lb

## 2023-10-24 DIAGNOSIS — Z1159 Encounter for screening for other viral diseases: Secondary | ICD-10-CM

## 2023-10-24 DIAGNOSIS — R079 Chest pain, unspecified: Secondary | ICD-10-CM | POA: Insufficient documentation

## 2023-10-24 DIAGNOSIS — R011 Cardiac murmur, unspecified: Secondary | ICD-10-CM | POA: Insufficient documentation

## 2023-10-24 DIAGNOSIS — Z87898 Personal history of other specified conditions: Secondary | ICD-10-CM | POA: Insufficient documentation

## 2023-10-24 DIAGNOSIS — E66811 Obesity, class 1: Secondary | ICD-10-CM | POA: Insufficient documentation

## 2023-10-24 DIAGNOSIS — R1031 Right lower quadrant pain: Secondary | ICD-10-CM | POA: Insufficient documentation

## 2023-10-24 DIAGNOSIS — Z23 Encounter for immunization: Secondary | ICD-10-CM

## 2023-10-24 DIAGNOSIS — E119 Type 2 diabetes mellitus without complications: Secondary | ICD-10-CM

## 2023-10-24 DIAGNOSIS — E1169 Type 2 diabetes mellitus with other specified complication: Secondary | ICD-10-CM

## 2023-10-24 HISTORY — DX: Type 2 diabetes mellitus with other specified complication: E11.69

## 2023-10-24 MED ORDER — ASPIRIN 81 MG PO TBEC: 81.0000 mg | DELAYED_RELEASE_TABLET | Freq: Every day | ORAL | Status: DC

## 2023-10-24 MED ORDER — METFORMIN HCL ER 500 MG PO TB24: ORAL_TABLET | ORAL | 11 refills | Status: DC

## 2023-10-24 MED ORDER — NITROGLYCERIN 0.4 MG SL SUBL: SUBLINGUAL_TABLET | SUBLINGUAL | 1 refills | Status: DC

## 2023-10-24 MED ORDER — CARVEDILOL 3.125 MG PO TABS: 3.1250 mg | ORAL_TABLET | Freq: Two times a day (BID) | ORAL | 11 refills | Status: DC

## 2023-10-24 MED ORDER — GLIMEPIRIDE 4 MG PO TABS: 4.0000 mg | ORAL_TABLET | Freq: Two times a day (BID) | ORAL | 11 refills | Status: DC

## 2023-10-24 NOTE — Progress Notes (Unsigned)
    Subjective:    Patient ID: Chloe Perez, female   DOB: [birth year], 47 y.o.   MRN: [id number]   HPI  Here to establish  [facility] interprets   DM:   diagnosed 8 years ago.  She was followed most recently in [place] at a clinic.  Does not know what her A1C has been running as has not been seen in 1.5 years.  Last A1C in this chart 8.1% ion 2019.  Taking Glimepiride daily.  Does not feel Metformin regularly as does not feel well with it--dizzy.  Sounds like she was started on GLP1 agonist when last seen at other office, but she feels it caused decreased vision. Had diabetic eye check at [facility] 4 months ago  She was told she needs glasses to readl.  She is using reading glasses. She has not had a COVID nor influenza vaccine this year.   She has not had a pneumococcal vaccine.   2.  Hyperlipidemia;  states never treated.  3.   Right lower quadrant:  When she coughs, she feels like a balloon blows up in the area.  She does not see anything pushing out from abdominal or groin wall.  No history of heavy lifting with pain in the area.  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    4.  History of + HPV testing 19 years ago when living in [place].  All of her paps since reportedly normal.    5.  Left precordial chest pain:  brings this up during exam.  Has had chest pain for past 3 months.  Generally, occurs every 2-3 weeks and occurs when she is cleaning.  Lies down and passes after 30 minutes.  Went to Urgent Care 08/06/2023 with the chest pain.  She sought attention this time as she checked her sugar and it was above 400.   ECG showed Last episode was 4 days ago.     Current Meds  Medication Sig   glimepiride (AMARYL) 4 MG tablet Take 4 mg by mouth 2 (two) times daily.   metFORMIN (GLUCOPHAGE) 1000 MG tablet Take 1 tablet (1,000 mg total) by mouth 2 (two) times daily with a meal.   No Known  Allergies   Review of Systems    Objective:   BP 120/62 (BP Location: Left Arm, Patient Position: Sitting, Cuff Size: Normal)   Pulse 81   Resp 16   Ht 4\' 11"  (1.499 m)   Wt 151 lb (68.5 kg)   LMP 08/24/2023 (Approximate)   BMI 30.50 kg/m   Physical Exam Cardiovascular:     Rate and Rhythm: Normal rate and regular rhythm.     Heart sounds: S1 normal and S2 normal. No murmur (Radiates to carotids bilaterally) heard.    No friction rub. No S3 or S4 sounds.  Musculoskeletal:     Right lower leg: No edema.     Left lower leg: No edema.     Protuberant abdomen--not clear if hernia in RLQ                     Assessment & Plan  Needs

## 2023-10-26 LAB — COMPREHENSIVE METABOLIC PANEL
ALT: 34 [IU]/L — ABNORMAL HIGH (ref 0–32)
AST: 43 [IU]/L — ABNORMAL HIGH (ref 0–40)
Albumin: 4 g/dL (ref 3.9–4.9)
Alkaline Phosphatase: 85 [IU]/L (ref 44–121)
BUN/Creatinine Ratio: 13 (ref 9–23)
BUN: 8 mg/dL (ref 6–24)
Bilirubin Total: <0.2 mg/dL (ref 0.0–1.2)
CO2: 20 mmol/L (ref 20–29)
Calcium: 8.8 mg/dL (ref 8.7–10.2)
Chloride: 100 mmol/L (ref 96–106)
Creatinine, Ser: 0.64 mg/dL (ref 0.57–1.00)
Globulin, Total: 2.6 g/dL (ref 1.5–4.5)
Glucose: 314 mg/dL — ABNORMAL HIGH (ref 70–99)
Potassium: 3.7 mmol/L (ref 3.5–5.2)
Sodium: 132 mmol/L — ABNORMAL LOW (ref 134–144)
Total Protein: 6.6 g/dL (ref 6.0–8.5)
eGFR: 111 mL/min/{1.73_m2} (ref >59)

## 2023-10-26 LAB — CBC WITH DIFFERENTIAL/PLATELET
Basophils Absolute: 0 10*3/uL (ref 0.0–0.2)
Basos: 0 %
EOS (ABSOLUTE): 0.1 10*3/uL (ref 0.0–0.4)
Eos: 2 %
Hematocrit: 39.5 % (ref 34.0–46.6)
Hemoglobin: 12.2 g/dL (ref 11.1–15.9)
Immature Grans (Abs): 0 10*3/uL (ref 0.0–0.1)
Immature Granulocytes: 0 %
Lymphocytes Absolute: 2.2 10*3/uL (ref 0.7–3.1)
Lymphs: 32 %
MCH: 24.7 pg — ABNORMAL LOW (ref 26.6–33.0)
MCHC: 30.9 g/dL — ABNORMAL LOW (ref 31.5–35.7)
MCV: 80 fL (ref 79–97)
Monocytes Absolute: 0.5 10*3/uL (ref 0.1–0.9)
Monocytes: 8 %
Neutrophils Absolute: 4 10*3/uL (ref 1.4–7.0)
Neutrophils: 58 %
Platelets: 237 10*3/uL (ref 150–450)
RBC: 4.93 x10E6/uL (ref 3.77–5.28)
RDW: 14.7 % (ref 11.7–15.4)
WBC: 6.8 10*3/uL (ref 3.4–10.8)

## 2023-10-26 LAB — MICROALBUMIN / CREATININE URINE RATIO
Creatinine, Urine: 27.8 mg/dL
Microalb/Creat Ratio: 12 mg/g{creat} (ref 0–29)
Microalbumin, Urine: 3.2 ug/mL

## 2023-10-26 LAB — HGB A1C W/O EAG: Hgb A1c MFr Bld: 11.9 % — ABNORMAL HIGH (ref 4.8–5.6)

## 2023-10-26 LAB — LIPID PANEL W/O CHOL/HDL RATIO
Cholesterol, Total: 134 mg/dL (ref 100–199)
HDL: 39 mg/dL — ABNORMAL LOW (ref >39)
LDL Chol Calc (NIH): 59 mg/dL (ref 0–99)
Triglycerides: 224 mg/dL — ABNORMAL HIGH (ref 0–149)
VLDL Cholesterol Cal: 36 mg/dL (ref 5–40)

## 2023-10-26 LAB — HEPATITIS C ANTIBODY: Hep C Virus Ab: NONREACTIVE

## 2023-10-26 LAB — TSH: TSH: 1.88 u[IU]/mL (ref 0.450–4.500)

## 2023-10-30 ENCOUNTER — Ambulatory Visit: Payer: Self-pay

## 2023-10-30 VITALS — BP 124/62 | HR 80

## 2023-10-30 DIAGNOSIS — Z0130 Encounter for examination of blood pressure without abnormal findings: Secondary | ICD-10-CM

## 2023-10-30 NOTE — Progress Notes (Signed)
Patient has been on carvedilol for two days now.

## 2023-11-29 ENCOUNTER — Telehealth: Payer: Self-pay | Admitting: Internal Medicine

## 2023-11-29 ENCOUNTER — Other Ambulatory Visit (INDEPENDENT_AMBULATORY_CARE_PROVIDER_SITE_OTHER): Payer: Self-pay

## 2023-11-29 DIAGNOSIS — N926 Irregular menstruation, unspecified: Secondary | ICD-10-CM

## 2023-11-29 LAB — POCT URINE PREGNANCY: Preg Test, Ur: POSITIVE — AB

## 2023-11-29 MED ORDER — PRENATAL PLUS 27-1 MG PO TABS: ORAL_TABLET | ORAL | 11 refills | Status: DC

## 2023-11-29 NOTE — Progress Notes (Signed)
 Please call and see if she has had any chest pain since she started on Carvedilol .   See if she has used the nitroglycerin  as well and how many times. She is okay with meds for a little bit, but eventually would like to wean off the Carvedilol  and Glimepiride --not now until can get her evaluated by Cardiology Please call their office and notify them she is now pregnant She will be a high risk OB patient as well. Please call [facility] and see if there is any way she can get in more quickly due to higher risk or if they recommend something else She told us  she had a period in September when last in. Was she actively trying to get pregnant? Has she stopped smoking--completely?? She can pick up the letter ASAP and apply for temporary medicaid and if turned down, Adopt a Mom, but will need to check on what she is claiming about coverage for the latter.

## 2023-11-29 NOTE — Progress Notes (Signed)
 Patient came in for pregnancy test after missing period since August 2024. Patient has had positive home pregnancy tests.

## 2023-11-30 ENCOUNTER — Telehealth: Payer: Self-pay | Admitting: Cardiovascular Disease

## 2023-11-30 NOTE — Telephone Encounter (Signed)
 Called and spoke with Cardiology office:  She very much needs to be evaluated there as her chest pain resolved since start of Carvedilol .  With her risk factors and poor DM control, still concerned her chest pain may be CAD in origin. Problems with Carvedilol  and pregnancy are more of a problem toward labor and delivery. Feel she needs an evaluation and if does not appear to be cardiac origin, can wean beta blocker.   Discussed with pregnancy, she will likely need exercise stress testing/echo stress She walked into clinic after call to cardiology and info on appt remade for Dec 28, 2023 at 3 p.m. given with address of cardiology office. She was told to get Medicaid app in ASAP as we need to get her into OB soon with her high risk and poorly controlled DM as well as current cardiac concerns.

## 2023-11-30 NOTE — Telephone Encounter (Signed)
 [employer] wanted to let Dr. Elease Hashimoto know that NP is currently pregnant

## 2023-11-30 NOTE — Telephone Encounter (Signed)
 Per PCP note yesterday, pt has not been having chest pain since starting Carvedilol . Pt now with positive pregnancy test. Called pt via interpretor to see if she wanted to keep her appt w/nahser in February and she states she doesn't need this any longer. Will cancel at this time.

## 2023-11-30 NOTE — Telephone Encounter (Signed)
 Patient is no longer experiencing chest pain since starting Carvedilol . She has only used the Nitroglycerin  once a while back and has not longer needed it. Patient already has an appointment to get set up with adopt a mom program.  Patient was not actively trying to get pregnant.  Patient has stopped smoking completely  Patient instructed to pick up letter and apply for temporary medicaid.

## 2023-12-04 ENCOUNTER — Inpatient Hospital Stay (HOSPITAL_COMMUNITY): Payer: Self-pay

## 2023-12-04 ENCOUNTER — Encounter (HOSPITAL_COMMUNITY): Payer: Self-pay | Admitting: *Deleted

## 2023-12-04 ENCOUNTER — Inpatient Hospital Stay (HOSPITAL_COMMUNITY)
Admission: AD | Admit: 2023-12-04 | Discharge: 2023-12-04 | Disposition: A | Discharge status: Home / Self Care | Payer: Self-pay | Attending: Family Medicine | Admitting: Family Medicine

## 2023-12-04 DIAGNOSIS — R102 Pelvic and perineal pain: Secondary | ICD-10-CM

## 2023-12-04 DIAGNOSIS — E1169 Type 2 diabetes mellitus with other specified complication: Secondary | ICD-10-CM

## 2023-12-04 DIAGNOSIS — Z3A13 13 weeks gestation of pregnancy: Secondary | ICD-10-CM | POA: Insufficient documentation

## 2023-12-04 DIAGNOSIS — N858 Other specified noninflammatory disorders of uterus: Secondary | ICD-10-CM | POA: Insufficient documentation

## 2023-12-04 DIAGNOSIS — Z91128 Patient's intentional underdosing of medication regimen for other reason: Secondary | ICD-10-CM | POA: Insufficient documentation

## 2023-12-04 DIAGNOSIS — E119 Type 2 diabetes mellitus without complications: Secondary | ICD-10-CM

## 2023-12-04 DIAGNOSIS — O26891 Other specified pregnancy related conditions, first trimester: Secondary | ICD-10-CM

## 2023-12-04 DIAGNOSIS — T383X6A Underdosing of insulin and oral hypoglycemic [antidiabetic] drugs, initial encounter: Secondary | ICD-10-CM | POA: Insufficient documentation

## 2023-12-04 DIAGNOSIS — O24111 Pre-existing diabetes mellitus, type 2, in pregnancy, first trimester: Secondary | ICD-10-CM | POA: Insufficient documentation

## 2023-12-04 DIAGNOSIS — E1165 Type 2 diabetes mellitus with hyperglycemia: Secondary | ICD-10-CM | POA: Insufficient documentation

## 2023-12-04 DIAGNOSIS — O26899 Other specified pregnancy related conditions, unspecified trimester: Secondary | ICD-10-CM

## 2023-12-04 DIAGNOSIS — Z7984 Long term (current) use of oral hypoglycemic drugs: Secondary | ICD-10-CM | POA: Insufficient documentation

## 2023-12-04 DIAGNOSIS — O34219 Maternal care for unspecified type scar from previous cesarean delivery: Secondary | ICD-10-CM | POA: Insufficient documentation

## 2023-12-04 LAB — URINALYSIS, ROUTINE W REFLEX MICROSCOPIC
Bacteria, UA: NONE SEEN
Bilirubin Urine: NEGATIVE
Glucose, UA: >=500 mg/dL — AB
Hgb urine dipstick: NEGATIVE
Ketones, ur: 80 mg/dL — AB
Leukocytes,Ua: NEGATIVE
Nitrite: NEGATIVE
Protein, ur: NEGATIVE mg/dL
Specific Gravity, Urine: 1.036 — ABNORMAL HIGH (ref 1.005–1.030)
pH: 5 (ref 5.0–8.0)

## 2023-12-04 LAB — CBC
HCT: 37.9 % (ref 36.0–46.0)
Hemoglobin: 13 g/dL (ref 12.0–15.0)
MCH: 25.9 pg — ABNORMAL LOW (ref 26.0–34.0)
MCHC: 34.3 g/dL (ref 30.0–36.0)
MCV: 75.6 fL — ABNORMAL LOW (ref 80.0–100.0)
Platelets: 244 10*3/uL (ref 150–400)
RBC: 5.01 MIL/uL (ref 3.87–5.11)
RDW: 15.5 % (ref 11.5–15.5)
WBC: 9.3 10*3/uL (ref 4.0–10.5)
nRBC: 0 % (ref 0.0–0.2)

## 2023-12-04 LAB — COMPREHENSIVE METABOLIC PANEL
ALT: 33 U/L (ref 0–44)
AST: 37 U/L (ref 15–41)
Albumin: 3.3 g/dL — ABNORMAL LOW (ref 3.5–5.0)
Alkaline Phosphatase: 58 U/L (ref 38–126)
Anion gap: 11 (ref 5–15)
BUN: 11 mg/dL (ref 6–20)
CO2: 19 mmol/L — ABNORMAL LOW (ref 22–32)
Calcium: 9.1 mg/dL (ref 8.9–10.3)
Chloride: 101 mmol/L (ref 98–111)
Creatinine, Ser: 0.42 mg/dL — ABNORMAL LOW (ref 0.44–1.00)
GFR, Estimated: >60 mL/min (ref >60)
Glucose, Bld: 232 mg/dL — ABNORMAL HIGH (ref 70–99)
Potassium: 3.7 mmol/L (ref 3.5–5.1)
Sodium: 131 mmol/L — ABNORMAL LOW (ref 135–145)
Total Bilirubin: 0.4 mg/dL (ref 0.0–1.2)
Total Protein: 6.7 g/dL (ref 6.5–8.1)

## 2023-12-04 LAB — WET PREP, GENITAL
Clue Cells Wet Prep HPF POC: NONE SEEN
Sperm: NONE SEEN
Trich, Wet Prep: NONE SEEN
WBC, Wet Prep HPF POC: <10 (ref <10)
Yeast Wet Prep HPF POC: NONE SEEN

## 2023-12-04 LAB — PROTEIN / CREATININE RATIO, URINE
Creatinine, Urine: 58 mg/dL
Protein Creatinine Ratio: 0.12 mg/mg{creat} (ref 0.00–0.15)
Total Protein, Urine: 7 mg/dL

## 2023-12-04 NOTE — MAU Note (Signed)
 Chloe Perez is a 48 y.o. at Unknown here in MAU reporting: +preg test 1/2 at dr's office.+HPT 2 wks ago.  pain across lower abd, and left hip.  Started yesterday afternoon. No bleeding. LMP: ? Mid Sept  Onset of complaint: yesterday Pain score: 5 Vitals:   12/04/23 1831  BP: (!) 144/71  Pulse: 81  Resp: 17  Temp: 98.4 F (36.9 C)  SpO2: 99%     FHT:144 Lab orders placed from triage:    No blood work or US  at appt  At times feels like she can't get her breath, or has to work to get it..... is on heart pills.

## 2023-12-04 NOTE — MAU Provider Note (Signed)
 Note from original provider copied for continuing care Unable to edit prior note Trudy Earnie CROME, CNM  Below was written by Eldonna MD History     CSN: [id number]  Arrival date and time: 12/04/23 1736   None     Chief Complaint  Patient presents with   Abdominal Pain   Hip Pain   Abdominal Pain Pertinent negatives include no diarrhea, dysuria, fever, nausea or vomiting.  Hip Pain    Chloe Perez  is 48 y.o. [id number] [redacted]w[redacted]d with a history of poorly controlled type 2 diabetes here with complaints of lower abdominal pain that is constant and pain.  She reports it was sudden onset yesterday.  She reports the pain is worsening.  She has not tried any medications for this.  She does report that she had some vaginal itching in the last several weeks and she used an over-the-counter yeast cream for the symptoms and symptoms alleviated.  She is a type II diabetic and is not checking her sugars regularly but when she does check them they are routinely in 300-400 range.  She reports that she is not taking her metformin  but does take her glimepiride .  She reports her last period was sometime in September.  She is unsure about how many weeks pregnant she truly is.  She reports her last sexual activity was approximately 3 weeks ago.  She does endorse a history of 1 prior C-section.  Patient also reports that she has been having some chest pain that has been left being worked up by her PCP.  She reports that she was put on carvedilol  and did use this for the last 2 days and is worried that this might have caused her symptoms.  +FM, denies LOF, VB, contractions, vaginal discharge.   OB History     Gravida  3   Para  1   Term      Preterm  1   AB  1   Living  0      SAB  1   IAB      Ectopic      Multiple      Live Births              Past Medical History:  Diagnosis Date   Diabetes mellitus without complication (HCC)    DM type 2 metformin     Missed AB     Past Surgical  History:  Procedure Laterality Date   CESAREAN SECTION N/A 05/28/2018   Procedure: CESAREAN SECTION;  Surgeon: Barbra Lang PARAS, DO;  Location: [facility];  Service: Obstetrics;  Laterality: N/A;   Uterine polyps     Had two different surgeris to remove--last 7 years ago.    Family History  Problem Relation Age of Onset   Hypertension Mother    Diabetes Mother    Heart disease Father     Social History   Tobacco Use   Smoking status: Former    Current packs/day: 0.00    Types: Cigarettes    Quit date: 09/28/2023    Years since quitting: 0.1   Smokeless tobacco: Never   Tobacco comments:    Discussed nicotine gum and how to use.  Vaping Use   Vaping status: Never Used  Substance Use Topics   Alcohol  use: Not Currently    Comment: sometimes   Drug use: No    Allergies: No Known Allergies  Medications Prior to Admission  Medication Sig Dispense Refill Last Dose/Taking   carvedilol  (COREG )  3.125 MG tablet Take 1 tablet (3.125 mg total) by mouth 2 (two) times daily with a meal. 60 tablet 11 12/04/2023   glimepiride  (AMARYL ) 4 MG tablet Take 1 tablet (4 mg total) by mouth 2 (two) times daily. 60 tablet 11 12/04/2023   aspirin  EC 81 MG tablet Take 1 tablet (81 mg total) by mouth daily. Swallow whole.      metFORMIN  (GLUCOPHAGE -XR) 500 MG 24 hr tablet 2 tabs by mouth twice daily. 120 tablet 11 More than a month   nitroGLYCERIN  (NITROSTAT ) 0.4 MG SL tablet 1 tab under tongue as needed for chest pain may repeat every 5 minutes times 2 if pain not relieved 25 tablet 1    prenatal vitamin w/FE, FA (PRENATAL 1 + 1) 27-1 MG TABS tablet 1 tab by mouth daily 30 tablet 11     Review of Systems  Constitutional:  Negative for chills and fever.  HENT:  Negative for congestion and sore throat.   Eyes:  Negative for pain and visual disturbance.  Respiratory:  Negative for cough, chest tightness and shortness of breath.   Cardiovascular:  Negative for chest pain.  Gastrointestinal:   Positive for abdominal pain. Negative for diarrhea, nausea and vomiting.  Endocrine: Negative for cold intolerance and heat intolerance.  Genitourinary:  Negative for dysuria and flank pain.  Musculoskeletal:  Negative for back pain.  Skin:  Negative for rash.  Allergic/Immunologic: Negative for food allergies.  Neurological:  Negative for dizziness and light-headedness.  Psychiatric/Behavioral:  Negative for agitation.    Physical Exam   Blood pressure 127/70, pulse 86, temperature 98.4 F (36.9 C), temperature source Oral, resp. rate 17, height 5' (1.524 m), weight 67.7 kg, last menstrual period 08/13/2023, SpO2 99%.  Patient Vitals for the past 24 hrs:  BP Temp Temp src Pulse Resp SpO2 Height Weight  12/04/23 2016 127/70 -- -- 86 -- -- -- --  12/04/23 2001 (!) 143/79 -- -- 89 -- -- -- --  12/04/23 1948 117/68 -- -- 83 -- -- -- --  12/04/23 1931 (!) 142/59 -- -- 90 -- -- -- --  12/04/23 1916 131/76 -- -- 91 -- -- -- --  12/04/23 1900 118/68 -- -- 88 -- 99 % -- --  12/04/23 1855 133/63 -- -- 83 -- 99 % -- --  12/04/23 1831 (!) 144/71 98.4 F (36.9 C) Oral 81 17 99 % 5' (1.524 m) 67.7 kg    Physical Exam Vitals and nursing note reviewed. Exam conducted with a chaperone present.  Constitutional:      General: She is not in acute distress.    Appearance: She is well-developed.     Comments: Pregnant female  HENT:     Head: Normocephalic and atraumatic.  Eyes:     General: No scleral icterus.    Conjunctiva/sclera: Conjunctivae normal.  Cardiovascular:     Rate and Rhythm: Normal rate.  Pulmonary:     Effort: Pulmonary effort is normal.  Chest:     Chest wall: No tenderness.  Abdominal:     Palpations: Abdomen is soft.     Tenderness: There is no abdominal tenderness. There is no guarding or rebound.     Comments: Gravid  Genitourinary:    Exam position: Lithotomy position.     Labia:        Right: No rash.        Left: No rash.      Vagina: Normal.   Musculoskeletal:  General: Normal range of motion.     Cervical back: Normal range of motion and neck supple.  Skin:    General: Skin is warm and dry.     Findings: No rash.  Neurological:     Mental Status: She is alert and oriented to person, place, and time.     MAU Course  Procedures  MDM: high  This patient presents to the ED for concern of   Chief Complaint  Patient presents with   Abdominal Pain   Hip Pain     These complains involves an extensive number of treatment options, and is a complaint that carries with it a high risk of complications and morbidity.  The differential diagnosis for  1. Abdominal pain INCLUDES normal variant (broad ligament), scar stretching, abruption, given poorly controlled GDM could be infectious causes but less likely. Atypical presentation of ACS is also on DDX givne her prior chest pain, age ang T2DM   Co morbidities that complicate the patient evaluation: Patient Active Problem List   Diagnosis Date Noted   Heart murmur, systolic 10/24/2023   Hyperlipidemia associated with type 2 diabetes mellitus (HCC) 10/24/2023   Obesity (BMI 30.0-34.9) 10/24/2023   RLQ abdominal pain 10/24/2023   Chest pain 10/24/2023   Status post cesarean section 05/29/2018   Non-English speaking patient 02/14/2018   Advanced maternal age in multigravida 02/14/2018   PCOS (polycystic ovarian syndrome) 02/08/2017   Female hypogonadism 02/08/2017   Infertility, female, secondary 06/25/2012   Type II diabetes mellitus (HCC) 06/03/2012    Interpreter services used: yes  External records from outside source obtained and reviewed including Scanned media records, CareEverywhere, and Prenatal care records  I ordered, and personally interpreted labs.  Lab Tests:  Results for orders placed or performed during the hospital encounter of 12/04/23 (from the past 24 hours)  Urinalysis, Routine w reflex microscopic -Urine, Clean Catch     Status: Abnormal    Collection Time: 12/04/23  6:50 PM  Result Value Ref Range   Color, Urine YELLOW YELLOW   APPearance CLEAR CLEAR   Specific Gravity, Urine 1.036 (H) 1.005 - 1.030   pH 5.0 5.0 - 8.0   Glucose, UA >=500 (A) NEGATIVE mg/dL   Hgb urine dipstick NEGATIVE NEGATIVE   Bilirubin Urine NEGATIVE NEGATIVE   Ketones, ur 80 (A) NEGATIVE mg/dL   Protein, ur NEGATIVE NEGATIVE mg/dL   Nitrite NEGATIVE NEGATIVE   Leukocytes,Ua NEGATIVE NEGATIVE   RBC / HPF 0-5 0 - 5 RBC/hpf   WBC, UA 0-5 0 - 5 WBC/hpf   Bacteria, UA NONE SEEN NONE SEEN   Squamous Epithelial / HPF 0-5 0 - 5 /HPF  Protein / creatinine ratio, urine     Status: None   Collection Time: 12/04/23  6:50 PM  Result Value Ref Range   Creatinine, Urine 58 mg/dL   Total Protein, Urine 7 mg/dL   Protein Creatinine Ratio 0.12 0.00 - 0.15 mg/mg[Cre]  Wet prep, genital     Status: None   Collection Time: 12/04/23  8:26 PM   Specimen: Vaginal  Result Value Ref Range   Yeast Wet Prep HPF POC NONE SEEN NONE SEEN   Trich, Wet Prep NONE SEEN NONE SEEN   Clue Cells Wet Prep HPF POC NONE SEEN NONE SEEN   WBC, Wet Prep HPF POC <10 <10   Sperm NONE SEEN     Imaging Studies ordered:  I ordered imaging studies includingUS > 14 weeks I independently visualized and interpreted imaging which  showed  I agree with the radiologist interpretation   Medicines ordered and prescription drug management:  Medications:    Reevaluation of the patient after these medicines showed that the patient improved I have reviewed the patients home medicines and have made adjustments as needed   MAU Course: Assumed care at 2030pm US  OB Comp Less 14 Wks Result Date: 12/04/2023 CLINICAL DATA:  Lower abdominal pain. EXAM: OBSTETRIC <14 WK ULTRASOUND TECHNIQUE: Transabdominal ultrasound was performed for evaluation of the gestation as well as the maternal uterus and adnexal regions. COMPARISON:  None Available. FINDINGS: Intrauterine gestational sac: Single Yolk sac:   Visualized. Embryo:  Visualized. Cardiac Activity: Visualized. Heart Rate: 162 bpm CRL:   69 mm   13 w 1 d                  US  EDC: 06/09/2024 Subchorionic hemorrhage:  None visualized. Maternal uterus/adnexae: Bilateral ovaries are visualized and appear within normal limits. No pelvic free fluid. IMPRESSION: Single live intrauterine gestation measuring 13 weeks 1 day by crown-rump length. No acute abnormality. Electronically Signed   By: Greig Pique M.D.   On: 12/04/2023 21:01   2110hrs Waiting on labs to be drawn. US  showed fetus is younger than menstrual dates Wet prep negative Blood sugars poorly controlled so Dr Eldonna ordered labs to evaluate for DKA  Labs show elevated glucose but anion gap WNL.   Discussed importance of taking diabetic meds as ordered, as well as cardiac meds Message sent to get New OB appt and refer her to Dr Sheena for evaluation  Assessment and Plan  A;  [place] IUP at [redacted]w[redacted]d      Pelvic cramping, now minimal       Type 2 DM, poorly controlled  P:  Discharge home      Encouraged to resume meds      Message sent to get New OB appt and refer her to Dr Sheena for evaluation       Encouraged to return if she develops worsening of symptoms, increase in pain, fever, or other concerning symptoms.   Interpretor used throughout  Trudy Earnie LITTIE EDDY Earnie Trudy 12/04/2023, 9:18 PM

## 2023-12-05 LAB — CERVICOVAGINAL ANCILLARY ONLY
Chlamydia: NEGATIVE
Comment: NEGATIVE
Comment: NORMAL
Neisseria Gonorrhea: NEGATIVE

## 2023-12-19 ENCOUNTER — Telehealth: Payer: Self-pay

## 2023-12-19 ENCOUNTER — Other Ambulatory Visit: Payer: Self-pay

## 2023-12-19 ENCOUNTER — Other Ambulatory Visit (HOSPITAL_COMMUNITY)
Admission: RE | Admit: 2023-12-19 | Discharge: 2023-12-19 | Disposition: A | Discharge status: Home / Self Care | Payer: Self-pay | Source: Ambulatory Visit | Attending: Family Medicine | Admitting: Family Medicine

## 2023-12-19 ENCOUNTER — Ambulatory Visit (INDEPENDENT_AMBULATORY_CARE_PROVIDER_SITE_OTHER): Payer: Self-pay

## 2023-12-19 VITALS — BP 132/73 | HR 86

## 2023-12-19 DIAGNOSIS — Z3A15 15 weeks gestation of pregnancy: Secondary | ICD-10-CM

## 2023-12-19 DIAGNOSIS — Z31430 Encounter of female for testing for genetic disease carrier status for procreative management: Secondary | ICD-10-CM

## 2023-12-19 DIAGNOSIS — O0990 Supervision of high risk pregnancy, unspecified, unspecified trimester: Secondary | ICD-10-CM | POA: Insufficient documentation

## 2023-12-19 DIAGNOSIS — O0992 Supervision of high risk pregnancy, unspecified, second trimester: Secondary | ICD-10-CM

## 2023-12-19 DIAGNOSIS — E1169 Type 2 diabetes mellitus with other specified complication: Secondary | ICD-10-CM

## 2023-12-19 NOTE — Telephone Encounter (Signed)
Called Pt using [address] [place] id# [id number], to advise of [facility] [place] appointment on 01/14/24@ 10:30a and to bring 150.00, no children allowed. No answer, left VM.

## 2023-12-19 NOTE — Progress Notes (Signed)
New OB Intake  I connected with Chloe Perez  on 12/19/23 at 11:15 AM EST by In Person Visit and verified that I am speaking with the correct person using two identifiers. Nurse is located at [facility] and pt is located at [facility].  I discussed the limitations, risks, security and privacy concerns of performing an evaluation and management service by telephone and the availability of in person appointments. I also discussed with the patient that there may be a patient responsible charge related to this service. The patient expressed understanding and agreed to proceed.  I explained I am completing New OB Intake today. We discussed EDD of 06/09/2024, by Ultrasound. Pt is [id number]. I reviewed her allergies, medications and Medical/Surgical/OB history.    Patient Active Problem List   Diagnosis Date Noted   Supervision of high risk pregnancy, antepartum 12/19/2023   Heart murmur, systolic 10/24/2023   Hyperlipidemia associated with type 2 diabetes mellitus (HCC) 10/24/2023   Obesity (BMI 30.0-34.9) 10/24/2023   RLQ abdominal pain 10/24/2023   Chest pain 10/24/2023   Status post cesarean section 05/29/2018   Non-English speaking patient 02/14/2018   Advanced maternal age in multigravida 02/14/2018   PCOS (polycystic ovarian syndrome) 02/08/2017   Female hypogonadism 02/08/2017   Infertility, female, secondary 06/25/2012   Type II diabetes mellitus (HCC) 06/03/2012    Concerns addressed today  Delivery Plans Plans to deliver at [facility]. Discussed the nature of our practice with multiple providers including residents and students. Due to the size of the practice, the delivering provider may not be the same as those providing prenatal care.   Patient is not interested in water birth. Offered upcoming OB visit with CNM to discuss further.  MyChart/Babyscripts MyChart access verified. I explained pt will have some visits in office and some virtually. Babyscripts instructions given  and order placed. Patient verifies receipt of registration text/e-mail. Account successfully created and app downloaded. If patient is a candidate for Optimized scheduling, add to sticky note.   Blood Pressure Cuff/Weight Scale Patient is self-pay; explained patient will be given BP cuff at first prenatal appt. Explained after first prenatal appt pt will check weekly and document in Babyscripts. Patient does have weight scale.  Anatomy US Explained first scheduled [place] will be around 19 weeks. [facility] US scheduled for 01/14/24 at 10;30a.  Is patient a CenteringPregnancy candidate?     If accepted,    Is patient a Mom+Baby Combined Care candidate?  Not a candidate   If accepted, confirm patient does not intend to move from the area for at least 12 months, then notify Mom+Baby staff  Interested in [place]? If yes, send referral and doula dot phrase.   Is patient a candidate for Babyscripts Optimization? No, due to hrob   First visit review I reviewed new OB appt with patient. Explained pt will be seen by Dr.Pickens at first visit. Discussed Avelina Laine genetic screening with patient. done [employer].. Routine prenatal labs  collected today.    Last Pap [facility] Pt...needs B[facility]CP  Henrietta Dine, CMA 12/19/2023  1:08 PM

## 2023-12-19 NOTE — Progress Notes (Signed)
[facility] [place] sched. 01/14/24@ 10:30a

## 2023-12-20 ENCOUNTER — Telehealth: Payer: Self-pay

## 2023-12-20 LAB — GC/CHLAMYDIA PROBE AMP (~~LOC~~) NOT AT ARMC
Chlamydia: NEGATIVE
Comment: NEGATIVE
Comment: NORMAL
Neisseria Gonorrhea: NEGATIVE

## 2023-12-20 LAB — COMPREHENSIVE METABOLIC PANEL
ALT: 20 [IU]/L (ref 0–32)
AST: 22 [IU]/L (ref 0–40)
Albumin: 3.9 g/dL (ref 3.9–4.9)
Alkaline Phosphatase: 81 [IU]/L (ref 44–121)
BUN/Creatinine Ratio: 18 (ref 9–23)
BUN: 8 mg/dL (ref 6–24)
Bilirubin Total: <0.2 mg/dL (ref 0.0–1.2)
CO2: 16 mmol/L — ABNORMAL LOW (ref 20–29)
Calcium: 9.6 mg/dL (ref 8.7–10.2)
Chloride: 98 mmol/L (ref 96–106)
Creatinine, Ser: 0.45 mg/dL — ABNORMAL LOW (ref 0.57–1.00)
Globulin, Total: 2.7 g/dL (ref 1.5–4.5)
Glucose: 275 mg/dL — ABNORMAL HIGH (ref 70–99)
Potassium: 4 mmol/L (ref 3.5–5.2)
Sodium: 133 mmol/L — ABNORMAL LOW (ref 134–144)
Total Protein: 6.6 g/dL (ref 6.0–8.5)
eGFR: 121 mL/min/{1.73_m2} (ref >59)

## 2023-12-20 LAB — PROTEIN / CREATININE RATIO, URINE
Creatinine, Urine: 20.4 mg/dL
Protein, Ur: <4 mg/dL

## 2023-12-20 LAB — TSH: TSH: 1.68 u[IU]/mL (ref 0.450–4.500)

## 2023-12-20 LAB — HEMOGLOBIN A1C
Est. average glucose Bld gHb Est-mCnc: 249 mg/dL
Hgb A1c MFr Bld: 10.3 % — ABNORMAL HIGH (ref 4.8–5.6)

## 2023-12-20 NOTE — Telephone Encounter (Signed)
Called Pt using [address] [place] id # [phone] to advise of new [place] appt here on 01/23/24@ 9:15a, Pt verbalized understanding.

## 2023-12-21 LAB — CULTURE, OB URINE

## 2023-12-21 LAB — URINE CULTURE, OB REFLEX

## 2023-12-27 ENCOUNTER — Ambulatory Visit: Payer: Self-pay | Admitting: Dietician

## 2023-12-27 ENCOUNTER — Other Ambulatory Visit: Payer: Self-pay

## 2023-12-27 DIAGNOSIS — Z3A16 16 weeks gestation of pregnancy: Secondary | ICD-10-CM

## 2023-12-27 DIAGNOSIS — O0990 Supervision of high risk pregnancy, unspecified, unspecified trimester: Secondary | ICD-10-CM

## 2023-12-27 DIAGNOSIS — O0992 Supervision of high risk pregnancy, unspecified, second trimester: Secondary | ICD-10-CM

## 2023-12-27 DIAGNOSIS — E1169 Type 2 diabetes mellitus with other specified complication: Secondary | ICD-10-CM

## 2023-12-27 NOTE — Progress Notes (Signed)
Patient was seen for pre-existing Diabetes During Pregnancy on  12/27/2023   Start time 1117 and End time 1229  Estimated due date: 06/09/2024; [redacted]w[redacted]d  Spanish Language line Interpreter: [phone]  Clinical: Medications:  Current Outpatient Medications:    carvedilol (COREG) 3.125 MG tablet, Take 1 tablet (3.125 mg total) by mouth 2 (two) times daily with a meal., Disp: 60 tablet, Rfl: 11   glimepiride (AMARYL) 4 MG tablet, Take 1 tablet (4 mg total) by mouth 2 (two) times daily., Disp: 60 tablet, Rfl: 11   metFORMIN (GLUCOPHAGE-XR) 500 MG 24 hr tablet, 2 tabs by mouth twice daily., Disp: 120 tablet, Rfl: 11   nitroGLYCERIN (NITROSTAT) 0.4 MG SL tablet, 1 tab under tongue as needed for chest pain may repeat every 5 minutes times 2 if pain not relieved, Disp: 25 tablet, Rfl: 1   prenatal vitamin w/FE, FA (PRENATAL 1 + 1) 27-1 MG TABS tablet, 1 tab by mouth daily, Disp: 30 tablet, Rfl: 11   aspirin EC 81 MG tablet, Take 1 tablet (81 mg total) by mouth daily. Swallow whole. (Patient not taking: Reported on 12/19/2023), Disp: , Rfl:   Medical History:  Past Medical History:  Diagnosis Date   Diabetes mellitus without complication (HCC)    DM type 2 metformin    Missed AB     Labs: OGTT none Lab Results  Component Value Date   HGBA1C 10.3 (H) 12/19/2023     Dietary and Lifestyle History: Pt present today with a friend. Pt reports she does not have insurance. Pt reports she does not have a glucometer.  Pt reports she has not monitored her blood sugar in about 1 year. Pt reports she does the cooking and her spouse does the shopping. Pt reports she lives with her husband and her son. 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 Pt reports previous diabetes education and denies previous carbohydrate counting. Pt c/o headache, hunger and dizziness. Pt instructed to make Dr. Delrae Alfred aware of her blood sugar results.  All Pt's questions were answered during this encounter.   Physical Activity:  ADL's Stress:  8 out of 10 /self care includes sleep/naps Sleep: poor   24 hr Recall:  First Meal:~ 6 am:  2 cups lentils, instant coffee with 1 teaspoon sugar and 1/3 cup milk Snack: ~ 9am: 2 cups lentils, water Second meal: beans, rice, pork/chicken, 3 tortillas or home made soup with pasta, tomato, chicken or pork, broccoli, onions, garlic, squash, carrots, corn with 3 tortillas or 5 fried tortilla, cheese, beans Snack: none  Third meal: egg, ham, 2 slices of bread, coffee with 1 teaspoon sugar and 1/3 cup milk Snack: none Beverages: water, coffee with 1 teaspoon sugar and 1/3 cup milk  NUTRITION INTERVENTION  Nutrition education (E-1) on the following topics:   Initial Follow-up   [x]  []  Why dietary management is important in controlling blood glucose [x]  []  Effects each nutrient has on blood glucose levels [x]  []  Simple carbohydrates vs complex carbohydrates [x]  []  Fluid intake [x]  []  Creating a balanced meal plan [x]  []  Carbohydrate counting  [x]  []  When to check blood glucose levels [x]  []  Proper blood glucose monitoring techniques [x]  []  Effect of stress and stress reduction techniques  [x]  []  Exercise effect on blood glucose levels, appropriate exercise during pregnancy [x]  []  Importance of limiting caffeine and abstaining from alcohol and smoking [x]  []  Medications used for blood sugar control during pregnancy [x]  []  Hypoglycemia and rule of 15   Blood  glucose monitor given: [employer] code Lot # [id number] Exp: [id number] C-4 CBG: 256 mg/dL, reported as pre meal per Pt   Patient instructed to monitor glucose levels: QID FBS: 60 - <= 95 mg/dL; 2 hour: <= 295 mg/dL  Patient received handouts: Nutrition Diabetes and Pregnancy Carbohydrate Counting List Blood glucose log Snack ideas for diabetes during pregnancy  Patient will be seen for follow-up as needed.

## 2023-12-28 ENCOUNTER — Ambulatory Visit (INDEPENDENT_AMBULATORY_CARE_PROVIDER_SITE_OTHER): Payer: Self-pay | Admitting: Cardiology

## 2023-12-28 ENCOUNTER — Other Ambulatory Visit (HOSPITAL_COMMUNITY): Payer: Self-pay

## 2023-12-28 ENCOUNTER — Encounter: Payer: Self-pay | Admitting: Cardiology

## 2023-12-28 ENCOUNTER — Telehealth: Payer: Self-pay | Admitting: Pharmacist

## 2023-12-28 VITALS — BP 146/78 | HR 87 | Ht 60.0 in | Wt 152.7 lb

## 2023-12-28 DIAGNOSIS — O10912 Unspecified pre-existing hypertension complicating pregnancy, second trimester: Secondary | ICD-10-CM

## 2023-12-28 DIAGNOSIS — I10 Essential (primary) hypertension: Secondary | ICD-10-CM

## 2023-12-28 DIAGNOSIS — O09522 Supervision of elderly multigravida, second trimester: Secondary | ICD-10-CM

## 2023-12-28 DIAGNOSIS — O24012 Pre-existing diabetes mellitus, type 1, in pregnancy, second trimester: Secondary | ICD-10-CM

## 2023-12-28 DIAGNOSIS — R0789 Other chest pain: Secondary | ICD-10-CM

## 2023-12-28 MED ORDER — ASPIRIN 81 MG PO TBEC: 81.0000 mg | DELAYED_RELEASE_TABLET | Freq: Every day | ORAL | Status: DC

## 2023-12-28 MED ORDER — OMRON 3 SERIES BP MONITOR DEVI
0 refills | Status: AC
  Filled 2023-12-28: qty 1, 30d supply, fill #0

## 2023-12-28 MED ORDER — LABETALOL HCL 100 MG PO TABS: 100.0000 mg | ORAL_TABLET | Freq: Two times a day (BID) | ORAL | 0 refills | Status: DC

## 2023-12-28 NOTE — Telephone Encounter (Addendum)
In room consult. Sent new BP cuff and scheduled f/u in 1 week

## 2023-12-28 NOTE — Patient Instructions (Addendum)
Medication Instructions:  Your physician has recommended you make the following change in your medication:  STOP: Coreg START: Labetalol 100 mg twice daily START: Aspirin 81 mg one daily  *If you need a refill on your cardiac medications before your next appointment, please call your pharmacy*  Testing/Procedures: Your physician has requested that you have an echocardiogram - OB. Echocardiography is a painless test that uses sound waves to create images of your heart. It provides your doctor with information about the size and shape of your heart and how well your heart's chambers and valves are working. This procedure takes approximately one hour. There are no restrictions for this procedure. Please do NOT wear cologne, perfume, aftershave, or lotions (deodorant is allowed). Please arrive 15 minutes prior to your appointment time.  Please note: We ask at that you not bring children with you during ultrasound (echo/ vascular) testing. Due to room size and safety concerns, children are not allowed in the ultrasound rooms during exams. Our front office staff cannot provide observation of children in our lobby area while testing is being conducted. An adult accompanying a patient to their appointment will only be allowed in the ultrasound room at the discretion of the ultrasound technician under special circumstances. We apologize for any inconvenience.    Follow-Up: At [facility], you and your health needs are our priority.  As part of our continuing mission to provide you with exceptional heart care, we have created designated Provider Care Teams.  These Care Teams include your primary Cardiologist (physician) and Advanced Practice Providers (APPs -  Physician Assistants and Nurse Practitioners) who all work together to provide you with the care you need, when you need it.     Your next appointment:   6 week(s)  Provider:   Thomasene Ripple, DO    Other Instructions:

## 2023-12-30 NOTE — Progress Notes (Signed)
Cardio-Obstetrics Clinic  New Evaluation  Date:  12/30/2023   ID:  Chloe Perez, DOB 1976-10-27, MRN [id number]  PCP:  Julieanne Manson, MD   [facility] Providers Cardiologist:  None  Electrophysiologist:  None       Referring MD: Loletta Specter, PA-C   Chief Complaint: : I had chest pain "  History of Present Illness:    Chloe Perez is a 49 y.o. female [[id number]] who is being seen today for the evaluation of typical chest pain  at the request of Loletta Specter, PA-C.   Medical history of hypertension and diabetes, presents with concerns about her pregnancy due to her age. She reports experiencing chest pain, described as a feeling of tightness in the chest. The onset of this pain was in September. She was prescribed medication for her heart, but she admits to not taking it regularly, especially after finding out about her pregnancy a month ago due to concerns about potential harm to the baby. She also mentions a significant family loss in August, which she believes has been a source of stress and may have exacerbated her heart condition.   Prior CV Studies Reviewed: The following studies were reviewed today:   Past Medical History:  Diagnosis Date   Diabetes mellitus without complication (HCC)    DM type 2 metformin    Missed AB     Past Surgical History:  Procedure Laterality Date   CESAREAN SECTION N/A 05/28/2018   Procedure: CESAREAN SECTION;  Surgeon: Levie Heritage, DO;  [facility];  Service: Obstetrics;  Laterality: N/A;   Uterine polyps     Had two different surgeris to remove--last 7 years ago.      OB History     Gravida  3   Para  1   Term      Preterm  1   AB  1   Living  1      SAB  1   IAB      Ectopic      Multiple      Live Births  1               Current Medications: Current Meds  Medication Sig   glimepiride (AMARYL) 4 MG tablet Take 1 tablet (4 mg total) by  mouth 2 (two) times daily.   metFORMIN (GLUCOPHAGE-XR) 500 MG 24 hr tablet 2 tabs by mouth twice daily.   nitroGLYCERIN (NITROSTAT) 0.4 MG SL tablet 1 tab under tongue as needed for chest pain may repeat every 5 minutes times 2 if pain not relieved   prenatal vitamin w/FE, FA (PRENATAL 1 + 1) 27-1 MG TABS tablet 1 tab by mouth daily   [DISCONTINUED] aspirin EC 81 MG tablet Take 1 tablet (81 mg total) by mouth daily. Swallow whole.   [DISCONTINUED] carvedilol (COREG) 3.125 MG tablet Take 1 tablet (3.125 mg total) by mouth 2 (two) times daily with a meal.   [DISCONTINUED] labetalol (NORMODYNE) 100 MG tablet Take 1 tablet (100 mg total) by mouth 2 (two) times daily.     Allergies:   Patient has no known allergies.   Social History   Socioeconomic History   Marital status: Media planner    Spouse name: Darin Engels   Number of children: 1   Years of education: 9   Highest education level: Not on file  Occupational History   Occupation: Hair stylist  Tobacco Use   Smoking status: Former    Current  packs/day: 0.00    Types: Cigarettes    Quit date: 09/28/2023    Years since quitting: 0.2   Smokeless tobacco: Never   Tobacco comments:    Discussed nicotine gum and how to use.  Vaping Use   Vaping status: Never Used  Substance and Sexual Activity   Alcohol use: Not Currently    Comment: sometimes   Drug use: No   Sexual activity: Yes    Birth control/protection: None  Other Topics Concern   Not on file  Social History Narrative   Lives at home with long time female partner and their son.     Social Drivers of Corporate investment banker Strain: Low Risk  (10/24/2023)   Overall Financial Resource Strain (CARDIA)    Difficulty of Paying Living Expenses: Not hard at all  Food Insecurity: No Food Insecurity (12/27/2023)   Hunger Vital Sign    Worried About Running Out of Food in the Last Year: Never true    Ran Out of Food in the Last Year: Never true  Transportation Needs: No  Transportation Needs (10/24/2023)   PRAPARE - Administrator, Civil Service (Medical): No    Lack of Transportation (Non-Medical): No  Physical Activity: Not on file  Stress: Not on file  Social Connections: Unknown (01/11/2023)   Received from [facility]   Social Network    Social Network: Not on file      Family History  Problem Relation Age of Onset   Hypertension Mother    Diabetes Mother    Heart disease Father       ROS:   Please see the history of present illness.    Chest pain  All other systems reviewed and are negative.   Labs/EKG Reviewed:    EKG:   EKG was not ordered today.    Recent Labs: 12/04/2023: Hemoglobin 13.0; Platelets 244 12/19/2023: ALT 20; BUN 8; Creatinine, Ser 0.45; Potassium 4.0; Sodium 133; TSH 1.680   Recent Lipid Panel Lab Results  Component Value Date/Time   CHOL 134 10/24/2023 04:59 PM   TRIG 224 (H) 10/24/2023 04:59 PM   HDL 39 (L) 10/24/2023 04:59 PM   LDLCALC 59 10/24/2023 04:59 PM    Physical Exam:    VS:  BP (!) 146/78 (BP Location: Left Arm)   Pulse 87   Ht 5' (1.524 m)   Wt 152 lb 11.2 oz (69.3 kg)   LMP 08/13/2023 (Approximate)   SpO2 97%   BMI 29.82 kg/m     Wt Readings from Last 3 Encounters:  12/28/23 152 lb 11.2 oz (69.3 kg)  12/04/23 149 lb 4.8 oz (67.7 kg)  10/24/23 151 lb (68.5 kg)     GEN:  Well nourished, well developed in no acute distress HEENT: Normal NECK: No JVD; No carotid bruits LYMPHATICS: No lymphadenopathy CARDIAC: RRR, no murmurs, rubs, gallops RESPIRATORY:  Clear to auscultation without rales, wheezing or rhonchi  ABDOMEN: Soft, non-tender, non-distended MUSCULOSKELETAL:  No edema; No deformity  SKIN: Warm and dry NEUROLOGIC:  Alert and oriented x 3 PSYCHIATRIC:  Normal affect    Risk Assessment/Risk Calculators:     CARPREG II Risk Prediction Index Score:  1.  The patient's risk for a primary cardiac event is 5%.   Modified World Health Organization [facility])  Classification of Maternal CV Risk   Class I         ASSESSMENT & PLAN:    Hypertension in Pregnancy Blood pressure elevated at 146/78.  Patient has a history of diabetes, adding to her risk profile. She was previously on carvedilol, which she stopped taking due to concerns about harm to the baby. -start labetalol 100 mg BID, stop coreg -Continue aspirin once daily. -Order an ultrasound of the heart due to chest pain and hypertension. -Follow-up in 6-8 weeks.  Chest Pain Patient reports chest pain described as tightness, located centrally. The pain has been present since September. Sounds atypical but will start with echo t o assess for any structural abnormalities  -Order an ultrasound of the heart to further evaluate the cause of the chest pain.  -Continue aspirin once daily. -Follow-up in 6-8 weeks.  Diabetes Patient has a history of diabetes, currently managed by another provider. -No changes to current management plan at this time. HbAc is 10  16 weeks of gestation Patient is currently pregnant and has concerns due to her age. She has one other child, aged 68. -Continue routine prenatal care. -Address patient's concerns and provide reassurance as needed.   Patient Instructions  Medication Instructions:  Your physician has recommended you make the following change in your medication:  STOP: Coreg START: Labetalol 100 mg twice daily START: Aspirin 81 mg one daily  *If you need a refill on your cardiac medications before your next appointment, please call your pharmacy*  Testing/Procedures: Your physician has requested that you have an echocardiogram - OB. Echocardiography is a painless test that uses sound waves to create images of your heart. It provides your doctor with information about the size and shape of your heart and how well your heart's chambers and valves are working. This procedure takes approximately one hour. There are no restrictions for this  procedure. Please do NOT wear cologne, perfume, aftershave, or lotions (deodorant is allowed). Please arrive 15 minutes prior to your appointment time.  Please note: We ask at that you not bring children with you during ultrasound (echo/ vascular) testing. Due to room size and safety concerns, children are not allowed in the ultrasound rooms during exams. Our front office staff cannot provide observation of children in our lobby area while testing is being conducted. An adult accompanying a patient to their appointment will only be allowed in the ultrasound room at the discretion of the ultrasound technician under special circumstances. We apologize for any inconvenience.    Follow-Up: At [facility], you and your health needs are our priority.  As part of our continuing mission to provide you with exceptional heart care, we have created designated Provider Care Teams.  These Care Teams include your primary Cardiologist (physician) and Advanced Practice Providers (APPs -  Physician Assistants and Nurse Practitioners) who all work together to provide you with the care you need, when you need it.     Your next appointment:   6 week(s)  Provider:   Thomasene Ripple, DO    Other Instructions:     Dispo:  No follow-ups on file.   Medication Adjustments/Labs and Tests Ordered: Current medicines are reviewed at length with the patient today.  Concerns regarding medicines are outlined above.  Tests Ordered: Orders Placed This Encounter  Procedures   AMB Referral to [facility]care Pharm-D   ECHOCARDIOGRAM COMPLETE   Medication Changes: Meds ordered this encounter  Medications   DISCONTD: labetalol (NORMODYNE) 100 MG tablet    Sig: Take 1 tablet (100 mg total) by mouth 2 (two) times daily.    Dispense:  60 tablet    Refill:  0   aspirin EC 81  MG tablet    Sig: Take 1 tablet (81 mg total) by mouth daily. Swallow whole.   labetalol (NORMODYNE) 100 MG tablet    Sig: Take 1 tablet (100 mg  total) by mouth 2 (two) times daily.    Dispense:  60 tablet    Refill:  0

## 2024-01-02 ENCOUNTER — Other Ambulatory Visit (HOSPITAL_COMMUNITY): Payer: Self-pay

## 2024-01-04 ENCOUNTER — Ambulatory Visit: Payer: Self-pay | Admitting: Cardiovascular Disease

## 2024-01-07 ENCOUNTER — Other Ambulatory Visit: Payer: Self-pay

## 2024-01-07 ENCOUNTER — Other Ambulatory Visit (HOSPITAL_COMMUNITY)
Admission: RE | Admit: 2024-01-07 | Discharge: 2024-01-07 | Disposition: A | Discharge status: Home / Self Care | Payer: Self-pay | Source: Ambulatory Visit | Attending: Obstetrics and Gynecology | Admitting: Obstetrics and Gynecology

## 2024-01-07 ENCOUNTER — Ambulatory Visit: Payer: Self-pay | Admitting: Obstetrics and Gynecology

## 2024-01-07 VITALS — BP 147/86 | HR 85 | Wt 152.9 lb

## 2024-01-07 DIAGNOSIS — E66811 Obesity, class 1: Secondary | ICD-10-CM

## 2024-01-07 DIAGNOSIS — O09892 Supervision of other high risk pregnancies, second trimester: Secondary | ICD-10-CM

## 2024-01-07 DIAGNOSIS — O10912 Unspecified pre-existing hypertension complicating pregnancy, second trimester: Secondary | ICD-10-CM

## 2024-01-07 DIAGNOSIS — Z789 Other specified health status: Secondary | ICD-10-CM

## 2024-01-07 DIAGNOSIS — R011 Cardiac murmur, unspecified: Secondary | ICD-10-CM

## 2024-01-07 DIAGNOSIS — O09523 Supervision of elderly multigravida, third trimester: Secondary | ICD-10-CM

## 2024-01-07 DIAGNOSIS — Z87898 Personal history of other specified conditions: Secondary | ICD-10-CM

## 2024-01-07 DIAGNOSIS — Z98891 History of uterine scar from previous surgery: Secondary | ICD-10-CM

## 2024-01-07 DIAGNOSIS — Z91199 Patient's noncompliance with other medical treatment and regimen due to unspecified reason: Secondary | ICD-10-CM

## 2024-01-07 DIAGNOSIS — N760 Acute vaginitis: Secondary | ICD-10-CM

## 2024-01-07 DIAGNOSIS — Z3A18 18 weeks gestation of pregnancy: Secondary | ICD-10-CM

## 2024-01-07 DIAGNOSIS — O0990 Supervision of high risk pregnancy, unspecified, unspecified trimester: Secondary | ICD-10-CM

## 2024-01-07 MED ORDER — ASPIRIN 81 MG PO TBEC: 81.0000 mg | DELAYED_RELEASE_TABLET | Freq: Every day | ORAL | 1 refills | Status: DC

## 2024-01-07 NOTE — Progress Notes (Signed)
New OB Note  01/07/2024   Clinic: Center for [facility]  Chief Complaint: new OB  Transfer of Care Patient: no  History of Present Illness: [name] is a 49 y.o. [id number] at 18/1 weeks (EDC 7/14, based on 13wk u/s). Patient's last menstrual period was 08/13/2023 (approximate).  Preg complicated by has Type II diabetes mellitus (HCC); Pre-existing type 2 diabetes mellitus in pregnancy; Non-English speaking patient; Advanced maternal age in multigravida; History of cesarean delivery; Heart murmur, systolic; Obesity (BMI 30.0-34.9); History of chest pain; Supervision of high risk pregnancy, antepartum; and Poor compliance on their problem list.   She denies any OB s/s, cardiac or pulmonary s/s.   ROS: A 12-point review of systems was performed and negative, except as stated in the above HPI.  OBGYN History: As per HPI. OB History  Gravida Para Term Preterm AB Living  3 1  1 1 1   SAB IAB Ectopic Multiple Live Births  1    1    # Outcome Date GA Lbr Len/2nd Weight Sex Type Anes PTL Lv  3 Current           2 Preterm 05/28/18 [redacted]w[redacted]d    CS-Unspec     1 SAB            Prior children are healthy, doing well, and without any problems or issues: yes History of pap smears: No   Past Medical History: Past Medical History:  Diagnosis Date   Diabetes mellitus without complication (HCC)    DM type 2 metformin    Female hypogonadism 02/08/2017   Hyperlipidemia associated with type 2 diabetes mellitus (HCC) 10/24/2023   Missed AB    PCOS (polycystic ovarian syndrome) 02/08/2017    Past Surgical History: Past Surgical History:  Procedure Laterality Date   CESAREAN SECTION N/A 05/28/2018   Procedure: CESAREAN SECTION;  Surgeon: Chloe Heritage, DO;  [facility];  Service: Obstetrics;  Laterality: N/A;   Uterine polyps     Had two different surgeris to remove--last 7 years ago.    Family History:  Family History  Problem  Relation Age of Onset   Hypertension Mother    Diabetes Mother    Heart disease Father     Social History:  Social History   Socioeconomic History   Marital status: Media planner    Spouse name: Chloe Perez   Number of children: 1   Years of education: 9   Highest education level: Not on file  Occupational History   Occupation: Hair stylist  Tobacco Use   Smoking status: Former    Current packs/day: 0.00    Types: Cigarettes    Quit date: 09/28/2023    Years since quitting: 0.2   Smokeless tobacco: Never   Tobacco comments:    Discussed nicotine gum and how to use.  Vaping Use   Vaping status: Never Used  Substance and Sexual Activity   Alcohol use: Not Currently    Comment: sometimes   Drug use: No   Sexual activity: Yes    Birth control/protection: None  Other Topics Concern   Not on file  Social History Narrative   Lives at home with long time female partner and their son.     Social Drivers of Corporate investment banker Strain: Low Risk  (10/24/2023)   Overall Financial Resource Strain (CARDIA)    Difficulty of Paying Living Expenses: Not hard at all  Food Insecurity: No Food Insecurity (01/07/2024)   Hunger  Vital Sign    Worried About Programme researcher, broadcasting/film/video in the Last Year: Never true    Ran Out of Food in the Last Year: Never true  Transportation Needs: No Transportation Needs (01/07/2024)   PRAPARE - Administrator, Civil Service (Medical): No    Lack of Transportation (Non-Medical): No  Physical Activity: Not on file  Stress: Not on file  Social Connections: Unknown (01/11/2023)   [facility]   Social Network    Social Network: Not on file  Intimate Partner Violence: Unknown (01/11/2023)   Received from [facility]   HITS    Physically Hurt: Not on file    Insult or Talk Down To: Not on file    Threaten Physical Harm: Not on file    Scream or Curse: Not on file    Allergy: No Known Allergies  Current Outpatient  Medications: Prenatal Metformin 1000mg  bid Glimepiride 4mg  bid  Physical Exam:   BP (!) 147/86   Pulse 85   Wt 152 lb 14.4 oz (69.4 kg)   LMP 08/13/2023 (Approximate)   BMI 29.86 kg/m  Body mass index is 29.86 kg/m. Contractions: Not present Vag. Bleeding: None. FHTs: 150s  General appearance: Well nourished, well developed female in no acute distress.  Neck:  Supple, normal appearance, and no thyromegaly  Cardiovascular: S1, S2 normal, no murmur, rub or gallop, regular rate and rhythm Respiratory:  Clear to auscultation bilateral. Normal respiratory effort Abdomen: positive bowel sounds and no masses, hernias; diffusely non tender to palpation, non distended Breasts: patient declines to have breast exam. Neuro/Psych:  Normal mood and affect.  Skin:  Warm and dry.   Pelvic exam: declined  Laboratory: reviewed  Imaging:  reviewed  Assessment: patient stable  Plan: 1. [redacted] weeks gestation of pregnancy (Primary) Adopt a mom patient  She states that she hasn't taken her labetalol in a few days and she's concerned that "taking too many medications will hurt the baby."  Long d/w patient re: pregnancy, medical care, compliance and importance of BP control, CBG control on pregnancy, baby, and mom's health; I told her that likely she had her pregnancy outcome last time due to poor patient control of her various co-morbidities.   I recommended for her to start the low dose aspirin and this was sent in for her - AFP, Serum, Open Spina Bifida  2. Acute vaginitis Self swab done - Cervicovaginal ancillary only( [facility])  3. Multigravida of advanced maternal age in third trimester Has genetic appt on 2/26. Horizon negative but panorama not pulling through. RN got results from natera and low risk female - AFP, Serum, Open Spina Bifida  4. Non-English speaking patient In person interpreter used  5. History of cesarean delivery Patient had urgent 2019 PLTCS at 33 weeks due  to non reassuring fetal heart tones. Patient in clinic for routine antenatal testing and non reassuring testing. See above. Can d/w her re: TOLAC later, based on r/b and how patient is controlling her co-morbidities  6. Supervision of high risk pregnancy, antepartum See above  7. Obesity (BMI 30.0-34.9) Weight stable  8. Heart murmur, systolic Followed by Dr. Servando Salina with cardiology and she started her on labealol 100 bid at her 1/31 visit and plan for echo and RTC in 6 weeks; she has maternal echo for later this month and she was reminded of this  9. History of chest pain See above  10. Poor compliance See above  11. Chronic hypertension  See above  12. History of preterm birth See above.   13. DM2 in pregnancy See above. Patient seen by DM education on 1/30 and plan for RTC PRN.  She states she is checking her sugars three times a day at 0730, 1400 and 1900-2000.  0730: 120s-160s 1400: 190s-low 200s 1900-200: 140s-160s I told her she needs to start insulin asap, just like in her last pregnancy. She was on NPH and aspart last pregnancy and she states she didn't have insurance. She doesn't appear to have a good understanding of insulin, so message sent to DM education to get her in for a visit asap. I told her to continue her metformin and glimepiride for now but she'll need to continue on just the metformin when she starts her insulin. NPH 11/11 sent to pharmacy and aspart 14/14/14.   Needs fetal echo; if MFM doesn't schedule after her anatomy u/s, we will need to  Problem list reviewed and updated.  Follow up in 2 weeks.   >50% of 45 min visit spent on counseling and coordination of care.  Return in 16 days (on 01/23/2024) for md visit, high risk ob, in person.  Future Appointments  Date Time Provider Department Center  01/09/2024  5:30 PM Julieanne Manson, MD [facility] None  01/11/2024 10:00 AM [facility] PHARMACIST [facility] None  01/23/2024  8:15 AM Constant, Peggy, MD  [facility]  01/23/2024  9:15 AM [facility] NURSE [facility]  01/23/2024  9:30 AM [facility] US4 [facility]  01/23/2024 10:30 AM [facility] GENETIC COUNSELING RM [facility]  01/23/2024 11:30 AM [facility] PROVIDER 1 [facility]  01/25/2024 11:35 AM [facility] ECHO 5 [id number]    Chloe Copa MD Attending Center for [facility])

## 2024-01-08 ENCOUNTER — Encounter: Payer: Self-pay | Admitting: Obstetrics and Gynecology

## 2024-01-08 ENCOUNTER — Other Ambulatory Visit: Payer: Self-pay

## 2024-01-08 DIAGNOSIS — Z91199 Patient's noncompliance with other medical treatment and regimen due to unspecified reason: Secondary | ICD-10-CM | POA: Insufficient documentation

## 2024-01-08 LAB — CERVICOVAGINAL ANCILLARY ONLY
Bacterial Vaginitis (gardnerella): POSITIVE — AB
Candida Glabrata: NEGATIVE
Candida Vaginitis: POSITIVE — AB
Chlamydia: NEGATIVE
Comment: NEGATIVE
Comment: NEGATIVE
Comment: NEGATIVE
Comment: NEGATIVE
Comment: NEGATIVE
Comment: NORMAL
Neisseria Gonorrhea: NEGATIVE
Trichomonas: NEGATIVE

## 2024-01-08 MED ORDER — INSULIN NPH (HUMAN) (ISOPHANE) 100 UNIT/ML ~~LOC~~ SUSP: 11.0000 [IU] | Freq: Two times a day (BID) | SUBCUTANEOUS | 11 refills | Status: DC

## 2024-01-08 MED ORDER — INSULIN ASPART 100 UNIT/ML IJ SOLN: 14.0000 [IU] | Freq: Three times a day (TID) | INTRAMUSCULAR | 3 refills | Status: DC

## 2024-01-08 MED ORDER — ALCOHOL PADS 70 % PADS: 1.0000 | MEDICATED_PAD | Freq: Four times a day (QID) | 2 refills | Status: AC

## 2024-01-08 MED ORDER — INSULIN PEN NEEDLE 31G X 6 MM MISC: 1.0000 [IU] | Freq: Four times a day (QID) | 2 refills | Status: DC

## 2024-01-09 ENCOUNTER — Ambulatory Visit: Payer: Self-pay | Admitting: Internal Medicine

## 2024-01-09 LAB — AFP, SERUM, OPEN SPINA BIFIDA
AFP MoM: 1.45
AFP Value: 61.6 ng/mL
Gest. Age on Collection Date: 18 wk
Maternal Age At EDD: 46.4 a
OSBR Risk 1 IN: 3133
Test Results:: NEGATIVE
Weight: 152 [lb_av]

## 2024-01-11 ENCOUNTER — Ambulatory Visit: Payer: Self-pay | Attending: Internal Medicine

## 2024-01-11 NOTE — Progress Notes (Deleted)
 Patient ID: Roxanne Panek                 DOB: Aug 09, 1976                      MRN: [id number]     HPI: Shatonya Passon is a 48 y.o. female referred to Cardio OB clinic. PMH is significant for preexisting T2DM, HTN,   Current HTN meds: Labetalol 100mg  BID Previously tried:  BP goal:   Family History:   Social History:   Diet:   Exercise:   Home BP readings:   Wt Readings from Last 3 Encounters:  01/07/24 152 lb 14.4 oz (69.4 kg)  12/28/23 152 lb 11.2 oz (69.3 kg)  12/04/23 149 lb 4.8 oz (67.7 kg)   BP Readings from Last 3 Encounters:  01/07/24 (!) 147/86  12/28/23 (!) 146/78  12/19/23 132/73   Pulse Readings from Last 3 Encounters:  01/07/24 85  12/28/23 87  12/19/23 86    Renal function: CrCl cannot be calculated (Patient's most recent lab result is older than the maximum 21 days allowed.).  Past Medical History:  Diagnosis Date   Diabetes mellitus without complication (HCC)    DM type 2 metformin    Female hypogonadism 02/08/2017   Hyperlipidemia associated with type 2 diabetes mellitus (HCC) 10/24/2023   Missed AB    PCOS (polycystic ovarian syndrome) 02/08/2017    Current Outpatient Medications on File Prior to Visit  Medication Sig Dispense Refill   Alcohol Swabs (ALCOHOL PADS) 70 % PADS 1 Pad by Does not apply route 4 (four) times daily. 100 each 2   aspirin EC 81 MG tablet Take 1 tablet (81 mg total) by mouth daily. Swallow whole. 60 tablet 1   Blood Pressure Monitoring (OMRON 3 SERIES BP MONITOR) DEVI Use to take blood pressure as needed 1 each 0   glimepiride (AMARYL) 4 MG tablet Take 1 tablet (4 mg total) by mouth 2 (two) times daily. 60 tablet 11   insulin aspart (NOVOLOG RELION) 100 UNIT/ML injection Inject 14 Units into the skin 3 (three) times daily before meals. 10 mL 3   insulin NPH Human (NOVOLIN N) 100 UNIT/ML injection Inject 0.11 mLs (11 Units total) into the skin in the morning and at bedtime. 10 mL 11   Insulin Pen Needle  31G X 6 MM MISC 1 Units by Does not apply route 4 (four) times daily. 100 each 2   labetalol (NORMODYNE) 100 MG tablet Take 1 tablet (100 mg total) by mouth 2 (two) times daily. 60 tablet 0   metFORMIN (GLUCOPHAGE-XR) 500 MG 24 hr tablet 2 tabs by mouth twice daily. 120 tablet 11   nitroGLYCERIN (NITROSTAT) 0.4 MG SL tablet 1 tab under tongue as needed for chest pain may repeat every 5 minutes times 2 if pain not relieved 25 tablet 1   prenatal vitamin w/FE, FA (PRENATAL 1 + 1) 27-1 MG TABS tablet 1 tab by mouth daily 30 tablet 11   No current facility-administered medications on file prior to visit.    No Known Allergies   Assessment/Plan:  1. Hypertension -

## 2024-01-14 ENCOUNTER — Encounter: Payer: Self-pay | Admitting: Obstetrics and Gynecology

## 2024-01-14 ENCOUNTER — Other Ambulatory Visit: Payer: Self-pay

## 2024-01-15 ENCOUNTER — Other Ambulatory Visit: Payer: Self-pay

## 2024-01-15 ENCOUNTER — Ambulatory Visit: Payer: Self-pay

## 2024-01-15 ENCOUNTER — Encounter: Payer: Self-pay | Attending: Obstetrics and Gynecology | Admitting: Dietician

## 2024-01-15 DIAGNOSIS — O24112 Pre-existing diabetes mellitus, type 2, in pregnancy, second trimester: Secondary | ICD-10-CM

## 2024-01-15 DIAGNOSIS — O24419 Gestational diabetes mellitus in pregnancy, unspecified control: Secondary | ICD-10-CM

## 2024-01-15 DIAGNOSIS — Z3A19 19 weeks gestation of pregnancy: Secondary | ICD-10-CM

## 2024-01-15 DIAGNOSIS — Z713 Dietary counseling and surveillance: Secondary | ICD-10-CM | POA: Insufficient documentation

## 2024-01-15 DIAGNOSIS — Z3A20 20 weeks gestation of pregnancy: Secondary | ICD-10-CM | POA: Insufficient documentation

## 2024-01-15 NOTE — Progress Notes (Signed)
 Patient was seen for Pre-existing Diabetes During Pregnancy on 01/15/2024.  Start time 0815 and End time 0920   She is here today for insulin injection training.  Estimated due date: 06/09/2024; [redacted]w[redacted]d  [facility] [employer]  [phone] and Janalyn Shy [phone]  Clinical: Medications: Metformin, glimepiride, NPH (Novolin N) 11 units bid, insulin aspart (Novolog relion)  14 units before each meal Medical History: Type 2 Diabetes (2014) Labs: A1c 10.3% 12/19/2023 decreased from 11.9% 10/24/2023  Dietary and Lifestyle History: Patient lives with her husband and 53 yo son.   She is not employed.  Physical Activity: none Stress: none Sleep: 1-2 hour nap and 8 hours at night  24 hr Recall:  First Meal: egg, ham, cheese, coffee with 1/2 spoon sugar  Snack:  none Second meal:  fish, squash, 2 tortillas Snack:  none Third meal:  fish, squash, tortillas (2) Snack 2-3 am: Cereal (frosted flakes) and milk Beverages:  water, coffee with 1/2 tsp sugar and milk     NUTRITION INTERVENTION  Nutrition education (E-1) on the following topics:   Initial Follow-up  [x]  []  Why dietary management is important in controlling blood glucose [x]  []  Effects each nutrient has on blood glucose levels []  []  Simple carbohydrates vs complex carbohydrates []  []  Fluid intake [x]  []  Creating a balanced meal plan []  []  Carbohydrate counting  [x]  []  When to check blood glucose levels [x]  []  Proper blood glucose monitoring techniques []  []  Effect of stress and stress reduction techniques  [x]  []  Exercise effect on blood glucose levels, appropriate exercise during pregnancy []  []  Importance of limiting caffeine and abstaining from alcohol and smoking [x]  []  Medications used for blood sugar control during pregnancy [x]  []  Hypoglycemia and rule of 15 [x]  []  Postpartum self care   Patient has a meter prior to visit. Patient is testing pre breakfast and 2 hours after each meal. FBS: 114 Postprandial:  170-180 Provided additional strips - LOT [id number] B-1 (2 boxes) and 1 box lancets - Prodigy  Patient was instructed on use of her insulin pen.  Discussed the different types of her insulin, dosage of each type and frequency, injection site rotation and disposal of the needle. Patient successfully administered 11 units of the NPH insulin at 8:40 this am.  Patient instructed to monitor glucose levels: FBS: 60 - <= 95 mg/dL; 2 hour: <= 829 mg/dL  Patient received handouts: Spanish Nutrition Diabetes and Pregnancy Instructions on how to use an Insulin Pen Blood glucose log Snack ideas for diabetes during pregnancy  Patient will be seen for follow-up in one month.

## 2024-01-16 ENCOUNTER — Telehealth: Payer: Self-pay

## 2024-01-16 NOTE — Telephone Encounter (Addendum)
-----   Message from Merna sent at 01/14/2024 12:38 PM EST ----- Can you let her know that I don't recommend treating her BV and yeast infection b/c it will just keep coming back with her sugar control being so poor. If she wants, she can do OTC monistat 7 and then do it 2-3 times per week to see if that helps with her symptoms.  Called pt with [employer], and informed pt provider's recommendation.  Pt verbalized understanding with no further questions.  Leonette Nutting

## 2024-01-18 ENCOUNTER — Telehealth: Payer: Self-pay | Admitting: Dietician

## 2024-01-18 DIAGNOSIS — O10919 Unspecified pre-existing hypertension complicating pregnancy, unspecified trimester: Secondary | ICD-10-CM | POA: Insufficient documentation

## 2024-01-18 NOTE — Telephone Encounter (Signed)
 Called patient with the assistance of Nature conservation officer (Spanish).   Patient was not available and the interpretor left a message to return my call.  Message received from MD to stop the Glimepiride now that she has started the insulin.  Oran Rein, RD, LDN, CDCES, DipACLM

## 2024-01-21 ENCOUNTER — Other Ambulatory Visit: Payer: Self-pay

## 2024-01-21 ENCOUNTER — Ambulatory Visit: Payer: Self-pay

## 2024-01-23 ENCOUNTER — Ambulatory Visit (HOSPITAL_BASED_OUTPATIENT_CLINIC_OR_DEPARTMENT_OTHER): Payer: Self-pay

## 2024-01-23 ENCOUNTER — Ambulatory Visit (HOSPITAL_BASED_OUTPATIENT_CLINIC_OR_DEPARTMENT_OTHER): Payer: Self-pay | Admitting: Obstetrics and Gynecology

## 2024-01-23 ENCOUNTER — Ambulatory Visit: Payer: Self-pay | Attending: Obstetrics and Gynecology

## 2024-01-23 ENCOUNTER — Other Ambulatory Visit: Payer: Self-pay

## 2024-01-23 ENCOUNTER — Ambulatory Visit: Payer: Self-pay | Admitting: Obstetrics and Gynecology

## 2024-01-23 ENCOUNTER — Encounter: Payer: Self-pay | Admitting: Obstetrics and Gynecology

## 2024-01-23 VITALS — BP 127/69 | HR 83

## 2024-01-23 VITALS — BP 124/62 | HR 85 | Wt 157.0 lb

## 2024-01-23 DIAGNOSIS — Z794 Long term (current) use of insulin: Secondary | ICD-10-CM

## 2024-01-23 DIAGNOSIS — O09522 Supervision of elderly multigravida, second trimester: Secondary | ICD-10-CM

## 2024-01-23 DIAGNOSIS — O24112 Pre-existing diabetes mellitus, type 2, in pregnancy, second trimester: Secondary | ICD-10-CM

## 2024-01-23 DIAGNOSIS — O0992 Supervision of high risk pregnancy, unspecified, second trimester: Secondary | ICD-10-CM

## 2024-01-23 DIAGNOSIS — Z3A20 20 weeks gestation of pregnancy: Secondary | ICD-10-CM

## 2024-01-23 DIAGNOSIS — O09523 Supervision of elderly multigravida, third trimester: Secondary | ICD-10-CM

## 2024-01-23 DIAGNOSIS — Z98891 History of uterine scar from previous surgery: Secondary | ICD-10-CM

## 2024-01-23 DIAGNOSIS — O0990 Supervision of high risk pregnancy, unspecified, unspecified trimester: Secondary | ICD-10-CM

## 2024-01-23 DIAGNOSIS — O99212 Obesity complicating pregnancy, second trimester: Secondary | ICD-10-CM

## 2024-01-23 DIAGNOSIS — O10012 Pre-existing essential hypertension complicating pregnancy, second trimester: Secondary | ICD-10-CM | POA: Insufficient documentation

## 2024-01-23 DIAGNOSIS — R011 Cardiac murmur, unspecified: Secondary | ICD-10-CM

## 2024-01-23 DIAGNOSIS — O99891 Other specified diseases and conditions complicating pregnancy: Secondary | ICD-10-CM

## 2024-01-23 DIAGNOSIS — O34219 Maternal care for unspecified type scar from previous cesarean delivery: Secondary | ICD-10-CM

## 2024-01-23 DIAGNOSIS — Z7984 Long term (current) use of oral hypoglycemic drugs: Secondary | ICD-10-CM | POA: Insufficient documentation

## 2024-01-23 DIAGNOSIS — O09292 Supervision of pregnancy with other poor reproductive or obstetric history, second trimester: Secondary | ICD-10-CM | POA: Insufficient documentation

## 2024-01-23 DIAGNOSIS — O10919 Unspecified pre-existing hypertension complicating pregnancy, unspecified trimester: Secondary | ICD-10-CM

## 2024-01-23 DIAGNOSIS — Z79899 Other long term (current) drug therapy: Secondary | ICD-10-CM | POA: Insufficient documentation

## 2024-01-23 DIAGNOSIS — O10912 Unspecified pre-existing hypertension complicating pregnancy, second trimester: Secondary | ICD-10-CM

## 2024-01-23 DIAGNOSIS — E1169 Type 2 diabetes mellitus with other specified complication: Secondary | ICD-10-CM

## 2024-01-23 DIAGNOSIS — O34211 Maternal care for low transverse scar from previous cesarean delivery: Secondary | ICD-10-CM | POA: Insufficient documentation

## 2024-01-23 DIAGNOSIS — Z789 Other specified health status: Secondary | ICD-10-CM

## 2024-01-23 DIAGNOSIS — E669 Obesity, unspecified: Secondary | ICD-10-CM

## 2024-01-23 DIAGNOSIS — E6689 Other obesity not elsewhere classified: Secondary | ICD-10-CM

## 2024-01-23 DIAGNOSIS — E66811 Obesity, class 1: Secondary | ICD-10-CM

## 2024-01-23 DIAGNOSIS — N858 Other specified noninflammatory disorders of uterus: Secondary | ICD-10-CM | POA: Insufficient documentation

## 2024-01-23 DIAGNOSIS — Z3689 Encounter for other specified antenatal screening: Secondary | ICD-10-CM | POA: Insufficient documentation

## 2024-01-23 MED ORDER — INSULIN ASPART 100 UNIT/ML IJ SOLN
16.0000 [IU] | Freq: Three times a day (TID) | INTRAMUSCULAR | 3 refills | Status: DC
Start: 2024-01-23 — End: 2024-02-29

## 2024-01-23 MED ORDER — INSULIN NPH (HUMAN) (ISOPHANE) 100 UNIT/ML ~~LOC~~ SUSP: 13.0000 [IU] | Freq: Two times a day (BID) | SUBCUTANEOUS | 11 refills | Status: DC

## 2024-01-23 NOTE — Progress Notes (Signed)
 Maternal-Fetal Medicine Consultation I had the pleasure of [name]. She is G3 p1011 at 20w 2d gestation and is here for fetal anatomy scan. Her high-risk pregnancy problems include: -Type 2 diabetes diagnosed about 10 years ago.  Patient takes insulin NPH 13 units in the morning and at night.  She takes NovoLog 16/16/16 units with meals. She also takes metformin XR 1,000 mg twice daily. She reports both her fasting and postprandial levels are increased.  She has not brought her blood glucose log today. Recent hemoglobin A1c was 10.3% and 11.9% 3 months ago. She has not had ophthalmological examination to rule out proliferative retinopathy.  -Chronic hypertension.  Well-controlled with labetalol 100 mg twice daily.  Blood pressure today at our office is 127/69 mmHg.  -Advanced maternal age.  This is a natural conception.  On cell-free fetal DNA screening, the risks of fetal aneuploidies are not increased.  Patient met with our genetic counselor after ultrasound and you will be receiving a separate letter from our genetic counselor.  Patient opted not to have amniocentesis.  -Previous cesarean delivery.  Patient had preterm cesarean delivery at [redacted] weeks gestation because of nonreassuring fetal heart rate.  She had a low transverse cesarean section.  Ultrasound We performed a fetal anatomical survey.  Amniotic fluid is normal good fetal activity seen.  Fetal biometry is consistent with the previously established dates.  No markers of aneuploidies or obvious fetal structural defects are seen.  Placenta is anterior and there is no evidence of previa or placenta accreta spectrum. As maternal obesity imposes limitations on the resolution of images, fetal anomalies may be missed.  Our concerns include: Type 2 diabetes -I discussed normal blood glucose parameters and encouraged her to take insulin regularly and increase dosage as  needed with the help of her provider. -Hemoglobin A1c of 10% 11% is associated with 8% to 10% fetal congenital malformation (background rate 2%). -Complications of diabetes include fetal macrosomia, shoulder dystocia with attendant neurological injuries, stillbirth, neonatal respiratory distress syndrome and increased rate of NICU admissions. -I emphasized the importance of good blood glucose control to prevent fetal or neonatal adverse outcomes. -I encouraged her to make an appointment with an ophthalmologist to rule out retinopathy.  Chronic hypertension -Adverse outcomes of severe chronic hypertension include maternal stroke, endorgan damage, coagulation disturbances. Placental abruption is more common. -Superimposed preeclampsia occurs in more than 30% of women with chronic hypertension Patient takes low-dose aspirin prophylaxis. - Labetalol can be safely given in pregnancy.  Alternative medications include nifedipine. -I discussed ultrasound protocol of monitoring fetal growth assessment and antenatal testing. -Timing of delivery: Provided hypertension and diabetes are well controlled,  she can be delivered at 39 weeks' gestation.  Early term delivery is an option if hypertension is not well controlled.  Previous cesarean delivery Repeat cesarean deliveries increase the risks of placenta previa and/or placenta accreta spectrum.  Patient may consider VBAC risk of uterine scar dehiscence is 1% with VBAC.  Recommendations -An appointment was made for her to return in 4 weeks for fetal growth assessment and completion of fetal anatomy. -Fetal growth assessments every 4 weeks till delivery. -Weekly BPP from [redacted] weeks gestation till delivery. -Patient has an appointment for fetal echocardiography (02/12/2024). -Recommend ophthalmology examination to rule out proliferative retinopathy.  Consultation including face-to-face (more than 50%) counseling 45 minutes.

## 2024-01-23 NOTE — Progress Notes (Signed)
 PRENATAL VISIT NOTE  Subjective:  Chloe Perez is a 48 y.o. [id number] at [redacted]w[redacted]d being seen today for ongoing prenatal care.  She is currently monitored for the following issues for this high-risk pregnancy and has Type II diabetes mellitus (HCC); Pre-existing type 2 diabetes mellitus in pregnancy; Non-English speaking patient; Advanced maternal age in multigravida; History of cesarean delivery; Heart murmur, systolic; Obesity (BMI 30.0-34.9); History of chest pain; Supervision of high risk pregnancy, antepartum; Poor compliance; and Chronic hypertension during pregnancy, antepartum on their problem list.  Patient reports no complaints.  Contractions: Not present. Vag. Bleeding: None.  Movement: Absent. Denies leaking of fluid.   The following portions of the patient's history were reviewed and updated as appropriate: allergies, current medications, past family history, past medical history, past social history, past surgical history and problem list.   Objective:   Vitals:   01/23/24 0823  BP: 124/62  Pulse: 85  Weight: 157 lb (71.2 kg)    Fetal Status: Fetal Heart Rate (bpm): 166 Fundal Height: 20 cm Movement: Absent     General:  Alert, oriented and cooperative. Patient is in no acute distress.  Skin: Skin is warm and dry. No rash noted.   Cardiovascular: Normal heart rate noted  Respiratory: Normal respiratory effort, no problems with respiration noted  Abdomen: Soft, gravid, appropriate for gestational age.  Pain/Pressure: Absent     Pelvic: Cervical exam deferred        Extremities: Normal range of motion.  Edema: None  Mental Status: Normal mood and affect. Normal behavior. Normal judgment and thought content.   Assessment and Plan:  Pregnancy: G3P0111 at [redacted]w[redacted]d 1. [redacted] weeks gestation of pregnancy (Primary)   2. Supervision of high risk pregnancy, antepartum Patient is doing well Anatomy ultrasound scheduled for today  3. Type 2 diabetes mellitus with other  specified complication, without long-term current use of insulin (HCC) Patient did not bring log and reports fasting 90-99 and pp in the 140's Increased insulin by 2 units (pre-meal now 16 units TID and NPH 13 units ) Fetal echo ordered  4. Pre-existing type 2 diabetes mellitus during pregnancy in second trimester  5. History of cesarean delivery Patient is interested in TOLAC. Previous cesarean section due to NRFHT Information provided to review Formal counseling at a later visit  6. Chronic hypertension during pregnancy, antepartum Stable on labetalol 100 BID Continue ASA  7. Obesity (BMI 30.0-34.9)   8. Multigravida of advanced maternal age in third trimester   69. Non-English speaking patient Patient declined Spanish interpreter  Preterm labor symptoms and general obstetric precautions including but not limited to vaginal bleeding, contractions, leaking of fluid and fetal movement were reviewed in detail with the patient. Please refer to After Visit Summary for other counseling recommendations.   Return in about 4 weeks (around 02/20/2024) for in person, ROB, High risk.  Future Appointments  Date Time Provider Department Center  01/23/2024  9:15 AM [facility] NURSE [facility]  01/23/2024  9:30 AM [facility] US4 [facility]  01/23/2024 10:30 AM [facility] GENETIC COUNSELING RM [facility]  01/23/2024 11:30 AM [facility] PROVIDER 1 [facility]  01/25/2024 11:35 AM MC-CV CH ECHO 5 [facility]SITE3ECHO [facility]  02/12/2024  8:15 AM WMC-EDUCATION [facility]  02/21/2024  8:55 AM Adam Phenix, MD [facility]  03/19/2024  8:15 AM Adam Phenix, MD [facility]  03/19/2024  8:50 AM WMC-WOCA LAB [facility]  05/01/2024  4:00 PM Julieanne Manson, MD [facility] None    Catalina Antigua, MD

## 2024-01-23 NOTE — Progress Notes (Signed)
 [facility] for Maternal Fetal Care at [facility] [address] Phone:  [phone]   Fax:  [phone]      In-Person Genetic Counseling Clinic Note:   I spoke with 48 y.o. Chloe Perez today to discuss age-related pregnancy risks. She was referred by Julieanne Manson, MD. An in-person Spanish interpreter was utilized during the session.   Pregnancy History:    [id number]. EGA: [redacted]w[redacted]d by [place]. EDD: 06/09/2024. Chloe Perez has a 93 yo son. She had a miscarriage ~20 years ago. 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 Personal history of chest pain that started during pregnancy, hypertension, and diabetes. She is being followed by cardiology and has an echocardiogram scheduled. Denies bleeding, infections, and fevers in this pregnancy. Denies using tobacco, alcohol, or street drugs in this pregnancy.   Family History:    A three-generation pedigree was created and scanned into Epic under the Media tab.  Chloe Perez reports that her 2 yo son has a speech delay and is receiving speech therapy. He is otherwise doing well in school. She reports that providers had concerns regarding his heart and digestive tract, but these resolved. He is otherwise healthy. We discussed that these can be associated with a genetic condition or can be unrelated findings. We reviewed that a referral to pediatric genetics can be made if there was concern for a genetic condition from his pediatrician. Without known medical or genetic reports, recurrence risk is difficult to estimate.  Chloe Perez reports that FOB's niece has a 57 yo son with amelia of one arm. No other health concerns. We discussed that if isolated is typically not genetic. However, certain genetic conditions can be associated with limb differences. Without known medical reports, recurrence risk is difficult to estimate.  Maternal ethnicity reported as [place] and paternal ethnicity reported as [place]. Denies Ashkenazi  Jewish ancestry.  Family history not remarkable for consanguinity, intellectual disability, autism spectrum disorder, multiple spontaneous abortions, still births, or unexplained neonatal death.   Advanced Maternal Age:  We briefly discussed that the chance that a fetus would be affected with a chromosome difference increases with advanced maternal age. The chance that Chloe Perez's current pregnancy would be affected with a chromosome difference based on her age alone is approximately 1 in 15 (~7%). This means that there is a 14 in 15 (~93%) chance that the fetus would not be affected with a chromosome difference. We discussed that this risk may also be lower given her low-risk cfDNA screening results and normal fetal anatomy ultrasound. We discussed the option of amniocentesis and reviewed the technical aspects, benefits, risks, and limitations including the 1 in 500 risk for miscarriage. Chloe Perez declined amniocentesis and elected to be followed by ultrasounds.     Previous Testing Completed:  Low risk NIPS: Chloe Perez previously completed noninvasive prenatal screening (NIPS) in this pregnancy. The result is low risk, consistent with a female fetus. This screening significantly reduces but does not eliminate the chance that the current pregnancy has Down syndrome (trisomy 17), trisomy 30, trisomy 58, common sex chromosome conditions, and 22q11.2 microdeletion syndrome. Please see report for details. There are many genetic conditions that cannot be detected by NIPS.   Negative carrier screening: Chloe Perez previously completed carrier screening. She screened to not be a carrier for cystic fibrosis (CF), spinal muscular atrophy (SMA), alpha thalassemia, and beta hemoglobinopathies. Please see report for details. A negative result on carrier screening reduces but does not eliminate the chance of being a  carrier.   Negative ms-AFP screening: Chloe Perez previously completed a maternal serum AFP screen in this pregnancy. The  result is screen negative. Please see report for details. A negative result reduces the risk that the current pregnancy has an open neural tube defect. Closed neural tube defects and some open defects may not be detected by this screen.   Plan of Care:   Chloe Perez declined amniocentesis. Routine prenatal care.   Informed consent was obtained. All questions were answered.   65 minutes were spent on the date of the encounter in service to the patient including preparation, face-to-face consultation, discussion of test reports and available next steps, pedigree construction, genetic risk assessment, documentation, and care coordination.    Thank you for sharing in the care of Chloe Perez with [place].  Please do not hesitate to contact us at [phone] if you have any questions.   Sheppard Plumber, MS, [facility] Certified Genetic Counselor   Genetic counseling student involved in appointment: No.

## 2024-01-25 ENCOUNTER — Ambulatory Visit (HOSPITAL_COMMUNITY): Payer: Self-pay | Attending: Cardiology

## 2024-01-25 DIAGNOSIS — R0789 Other chest pain: Secondary | ICD-10-CM | POA: Insufficient documentation

## 2024-01-25 LAB — ECHOCARDIOGRAM COMPLETE
Area-P 1/2: 4.04 cm2
S' Lateral: 2.5 cm

## 2024-01-26 ENCOUNTER — Other Ambulatory Visit: Payer: Self-pay | Admitting: Cardiology

## 2024-02-01 ENCOUNTER — Other Ambulatory Visit: Payer: Self-pay | Admitting: *Deleted

## 2024-02-01 DIAGNOSIS — O10912 Unspecified pre-existing hypertension complicating pregnancy, second trimester: Secondary | ICD-10-CM

## 2024-02-01 DIAGNOSIS — O09522 Supervision of elderly multigravida, second trimester: Secondary | ICD-10-CM

## 2024-02-01 DIAGNOSIS — O24112 Pre-existing diabetes mellitus, type 2, in pregnancy, second trimester: Secondary | ICD-10-CM

## 2024-02-12 ENCOUNTER — Encounter: Payer: Self-pay | Attending: Obstetrics and Gynecology | Admitting: Dietician

## 2024-02-12 ENCOUNTER — Ambulatory Visit: Payer: Self-pay | Admitting: Dietician

## 2024-02-12 ENCOUNTER — Other Ambulatory Visit: Payer: Self-pay

## 2024-02-12 DIAGNOSIS — Z713 Dietary counseling and surveillance: Secondary | ICD-10-CM | POA: Insufficient documentation

## 2024-02-12 DIAGNOSIS — O24112 Pre-existing diabetes mellitus, type 2, in pregnancy, second trimester: Secondary | ICD-10-CM

## 2024-02-12 DIAGNOSIS — Z3A20 20 weeks gestation of pregnancy: Secondary | ICD-10-CM | POA: Insufficient documentation

## 2024-02-12 DIAGNOSIS — Z3A23 23 weeks gestation of pregnancy: Secondary | ICD-10-CM

## 2024-02-12 NOTE — Progress Notes (Signed)
 Patient was seen for Pre-existing Diabetes During Pregnancy on 01/15/2024.  Start time 0820 and End time [phone]  She is here today for insulin injection training.  Estimated due date: 06/09/2024; [redacted]w[redacted]d  [facility] Spanish Interpreter Kelby Fam #[id number]  Clinical: Medications: Metformin, glimepiride, NPH (Novolin N) 13 units bid, insulin aspart (Novolog relion)  16 units before each meal Medical History: Type 2 Diabetes (2014) Labs: A1c 10.3% 12/19/2023 decreased from 11.9% 10/24/2023  Dietary and Lifestyle History: Patient lives with her husband and 34 yo son.   She is not employed.  Physical Activity: none as she has abdominal pain when she walks Stress: none Sleep: 1-2 hour nap and 8 hours at night  24 hr Recall:  First Meal: green shake (spinach, broccoli, water), 1/2 egg and cheese sandwich, 1/2 sweet bread, coffee with 1/2 spoon sugar Snack:  none Second meal:  2 sopa's with beans, chicken, cheese, cabbage Snack:  apple Third meal:  leftovers from lunch, coffee with 1/2 spoon sugar Snack 2-3 am: none Beverages:  water, coffee with 1/2 tsp sugar and milk, homemade green shake  NUTRITION INTERVENTION  Nutrition education (E-1) on the following topics:   Initial Follow-up  [x]  []  Why dietary management is important in controlling blood glucose [x]  []  Effects each nutrient has on blood glucose levels []  []  Simple carbohydrates vs complex carbohydrates []  []  Fluid intake [x]  [x]  Creating a balanced meal plan []  []  Carbohydrate counting  [x]  []  When to check blood glucose levels [x]  []  Proper blood glucose monitoring techniques []  []  Effect of stress and stress reduction techniques  [x]  []  Exercise effect on blood glucose levels, appropriate exercise during pregnancy []  [x]  Importance of limiting caffeine and abstaining from alcohol and smoking [x]  []  Medications used for blood sugar control during pregnancy [x]  []  Hypoglycemia and rule of 15 [x]  []  Postpartum self care  Patient did  not bring her log with her.  She reports a fasting blood glucose of 90 this am and 130 after breakfast today.   Patient has a meter prior to visit. Patient is testing pre breakfast and 2 hours after each meal. FBS: 90-94 Postprandial: 90-143 Provided additional strips - LOT [id number] C-4 (2 boxes) and 1 box lancets - Prodigy She states that her vision has improved significantly since getting her blood glucose under better control and is thankful for this. She had questions re:  giving insulin, insulin injection site and rotation.  These questions were answered.  She states that she has pain occasionally, a couple of hours after the insulin injection.  Instructed her to give the insulin in fat.  Patient instructed to monitor glucose levels: FBS: 60 - <= 95 mg/dL; 2 hour: <= 784 mg/dL  Patient received handouts: Spanish (initial visit) Nutrition Diabetes and Pregnancy Instructions on how to use an Insulin Pen Blood glucose log Snack ideas for diabetes during pregnancy  Patient will be seen for follow-up in one month.

## 2024-02-13 ENCOUNTER — Ambulatory Visit: Payer: Self-pay

## 2024-02-15 ENCOUNTER — Ambulatory Visit: Payer: Self-pay | Admitting: Pharmacist

## 2024-02-15 NOTE — Progress Notes (Deleted)
 Patient ID: Chloe Perez                 DOB: Feb 14, 1976                      MRN: [id number]     HPI: Chloe Perez is a 48 y.o. female referred by Dr. Marland Kitchen to HTN clinic. PMH is significant for  Current HTN meds:  Previously tried:  BP goal:   Family History:   Social History:   Diet:   Exercise:   Home BP readings:   Wt Readings from Last 3 Encounters:  01/23/24 157 lb (71.2 kg)  01/07/24 152 lb 14.4 oz (69.4 kg)  12/28/23 152 lb 11.2 oz (69.3 kg)   BP Readings from Last 3 Encounters:  01/23/24 127/69  01/23/24 124/62  01/07/24 (!) 147/86   Pulse Readings from Last 3 Encounters:  01/23/24 83  01/23/24 85  01/07/24 85    Renal function: CrCl cannot be calculated (Patient's most recent lab result is older than the maximum 21 days allowed.).  Past Medical History:  Diagnosis Date   Diabetes mellitus without complication (HCC)    DM type 2 metformin    Female hypogonadism 02/08/2017   Heart pain    Hyperlipidemia associated with type 2 diabetes mellitus (HCC) 10/24/2023   Missed AB    PCOS (polycystic ovarian syndrome) 02/08/2017    Current Outpatient Medications on File Prior to Visit  Medication Sig Dispense Refill   Alcohol Swabs (ALCOHOL PADS) 70 % PADS 1 Pad by Does not apply route 4 (four) times daily. 100 each 2   aspirin EC 81 MG tablet Take 1 tablet (81 mg total) by mouth daily. Swallow whole. 60 tablet 1   Blood Pressure Monitoring (OMRON 3 SERIES BP MONITOR) DEVI Use to take blood pressure as needed 1 each 0   glimepiride (AMARYL) 4 MG tablet Take 1 tablet (4 mg total) by mouth 2 (two) times daily. 60 tablet 11   insulin aspart (NOVOLOG RELION) 100 UNIT/ML injection Inject 16 Units into the skin 3 (three) times daily before meals. 10 mL 3   insulin NPH Human (NOVOLIN N) 100 UNIT/ML injection Inject 0.13 mLs (13 Units total) into the skin in the morning and at bedtime. 10 mL 11   Insulin Pen Needle 31G X 6 MM MISC 1 Units by Does  not apply route 4 (four) times daily. 100 each 2   labetalol (NORMODYNE) 100 MG tablet TAKE 1 TABLET(100 MG) BY MOUTH TWICE DAILY 180 tablet 3   metFORMIN (GLUCOPHAGE-XR) 500 MG 24 hr tablet 2 tabs by mouth twice daily. 120 tablet 11   nitroGLYCERIN (NITROSTAT) 0.4 MG SL tablet 1 tab under tongue as needed for chest pain may repeat every 5 minutes times 2 if pain not relieved (Patient not taking: Reported on 01/23/2024) 25 tablet 1   prenatal vitamin w/FE, FA (PRENATAL 1 + 1) 27-1 MG TABS tablet 1 tab by mouth daily 30 tablet 11   No current facility-administered medications on file prior to visit.    Not on File   Assessment/Plan:  1. Hypertension -

## 2024-02-19 ENCOUNTER — Ambulatory Visit: Payer: Self-pay | Admitting: Internal Medicine

## 2024-02-20 ENCOUNTER — Encounter: Payer: Self-pay | Admitting: Family Medicine

## 2024-02-21 ENCOUNTER — Other Ambulatory Visit: Payer: Self-pay

## 2024-02-21 ENCOUNTER — Ambulatory Visit (INDEPENDENT_AMBULATORY_CARE_PROVIDER_SITE_OTHER): Payer: Self-pay | Admitting: Obstetrics & Gynecology

## 2024-02-21 VITALS — BP 132/80 | HR 89 | Wt 158.0 lb

## 2024-02-21 DIAGNOSIS — Z3A24 24 weeks gestation of pregnancy: Secondary | ICD-10-CM

## 2024-02-21 DIAGNOSIS — O0992 Supervision of high risk pregnancy, unspecified, second trimester: Secondary | ICD-10-CM

## 2024-02-21 DIAGNOSIS — O24112 Pre-existing diabetes mellitus, type 2, in pregnancy, second trimester: Secondary | ICD-10-CM

## 2024-02-21 DIAGNOSIS — O0990 Supervision of high risk pregnancy, unspecified, unspecified trimester: Secondary | ICD-10-CM

## 2024-02-21 NOTE — Progress Notes (Signed)
   PRENATAL VISIT NOTE  Subjective:  Chloe Perez is a 48 y.o. U9W1191 at [redacted]w[redacted]d being seen today for ongoing prenatal care.  She is currently monitored for the following issues for this high-risk pregnancy and has Type II diabetes mellitus (HCC); Pre-existing type 2 diabetes mellitus in pregnancy; Non-English speaking patient; Advanced maternal age in multigravida; History of cesarean delivery; Heart murmur, systolic; Obesity (BMI 30.0-34.9); History of chest pain; Supervision of high risk pregnancy, antepartum; Poor compliance; and Chronic hypertension during pregnancy, antepartum on their problem list.  Patient reports no complaints.  Contractions: Not present. Vag. Bleeding: None.  Movement: Present. Denies leaking of fluid.   The following portions of the patient's history were reviewed and updated as appropriate: allergies, current medications, past family history, past medical history, past social history, past surgical history and problem list.   Objective:   Vitals:   02/21/24 0900 02/21/24 0908  BP: (!) 149/84 132/80  Pulse: 89   Weight: 158 lb (71.7 kg)     Fetal Status: Fetal Heart Rate (bpm): 147   Movement: Present     General:  Alert, oriented and cooperative. Patient is in no acute distress.  Skin: Skin is warm and dry. No rash noted.   Cardiovascular: Normal heart rate noted  Respiratory: Normal respiratory effort, no problems with respiration noted  Abdomen: Soft, gravid, appropriate for gestational age.  Pain/Pressure: Present     Pelvic: Cervical exam deferred        Extremities: Normal range of motion.  Edema: None  Mental Status: Normal mood and affect. Normal behavior. Normal judgment and thought content.   Assessment and Plan:  Pregnancy: Y7W2956 at [redacted]w[redacted]d 1. Supervision of high risk pregnancy, antepartum (Primary) - Continue routine prenatal care. - Monitor for signs of pre-eclampsia   2. Pre-existing type 2 diabetes mellitus during pregnancy in  second trimester - Continue to monitor glucose levels closely.  - Continue routine fetal growth ultrasounds as indicated to monitor growth.  3. [redacted] weeks gestation of pregnancy   Preterm labor symptoms and general obstetric precautions including but not limited to vaginal bleeding, contractions, leaking of fluid and fetal movement were reviewed in detail with the patient. Please refer to After Visit Summary for other counseling recommendations.   Return in about 4 weeks (around 03/20/2024).  Future Appointments  Date Time Provider Department Center  02/22/2024  8:15 AM [facility] NURSE [facility]  02/22/2024  8:30 AM [facility] US3 [facility]  02/29/2024  2:00 PM Cheree Ditto, [facility] None  03/04/2024  9:15 AM [facility]  03/19/2024  8:15 AM Adam Phenix, MD [facility]  03/19/2024  8:50 AM W[facility]-WOCA LAB [facility]  05/01/2024  4:00 PM Julieanne Manson, MD [facility] None    Kerrin Mo, Medical Student Attestation of Attending Supervision of Medical Student: Evaluation and management procedures were performed by the medical student under my supervision and collaboration.  I have reviewed the student's note and chart, and I agree with the management and plan.  Scheryl Darter, MD, FACOG Attending Obstetrician & Gynecologist Faculty Practice, [facility]

## 2024-02-22 ENCOUNTER — Ambulatory Visit: Payer: Self-pay

## 2024-02-22 ENCOUNTER — Ambulatory Visit: Payer: Self-pay | Attending: Obstetrics and Gynecology

## 2024-02-22 ENCOUNTER — Ambulatory Visit: Payer: Self-pay | Attending: Obstetrics and Gynecology | Admitting: Obstetrics and Gynecology

## 2024-02-22 ENCOUNTER — Other Ambulatory Visit: Payer: Self-pay | Admitting: *Deleted

## 2024-02-22 VITALS — BP 116/66

## 2024-02-22 DIAGNOSIS — O09522 Supervision of elderly multigravida, second trimester: Secondary | ICD-10-CM

## 2024-02-22 DIAGNOSIS — Z3A24 24 weeks gestation of pregnancy: Secondary | ICD-10-CM

## 2024-02-22 DIAGNOSIS — E6689 Other obesity not elsewhere classified: Secondary | ICD-10-CM

## 2024-02-22 DIAGNOSIS — O10012 Pre-existing essential hypertension complicating pregnancy, second trimester: Secondary | ICD-10-CM

## 2024-02-22 DIAGNOSIS — Z794 Long term (current) use of insulin: Secondary | ICD-10-CM

## 2024-02-22 DIAGNOSIS — O3663X0 Maternal care for excessive fetal growth, third trimester, not applicable or unspecified: Secondary | ICD-10-CM

## 2024-02-22 DIAGNOSIS — E119 Type 2 diabetes mellitus without complications: Secondary | ICD-10-CM

## 2024-02-22 DIAGNOSIS — O24112 Pre-existing diabetes mellitus, type 2, in pregnancy, second trimester: Secondary | ICD-10-CM

## 2024-02-22 DIAGNOSIS — O0990 Supervision of high risk pregnancy, unspecified, unspecified trimester: Secondary | ICD-10-CM | POA: Insufficient documentation

## 2024-02-22 DIAGNOSIS — O10912 Unspecified pre-existing hypertension complicating pregnancy, second trimester: Secondary | ICD-10-CM | POA: Insufficient documentation

## 2024-02-22 NOTE — Progress Notes (Signed)
  Maternal-Fetal Medicine Consultation I had the pleasure of seeing [name] [facility] at [facility]. She is G3 P1011 at 24w 4d gestation and is here for completion of fetal anatomy. -Type 2 diabetes.  Patient takes insulin NPH 13 units in the morning and 13 units at night.  She takes NovoLog 16/16/16 units with meals.  Patient also takes metformin XR 1000 mg twice daily.  She reports her fasting levels are around 90 mg/dL and postprandial levels are below 120 mg/dL. -Chronic hypertension.  Well-controlled with labetalol 100 mg twice daily.  Blood pressure today at our office is 116/66 mmHg.  Patient had echocardiography and the ejection fraction was within normal range.  She missed her fetal echocardiography appointment.  Ultrasound The estimated fetal weight is at the 97 percentile and the abdominal circumference measurement at the 98 percentile.  Amniotic fluid is normal good fetal activity seen.  Fetal anatomical survey was completed and appears normal.  Type 2 diabetes I explained ultrasound findings and that the finding of large for gestational age fetus could be from her previous poor control.  I encouraged her to check her blood glucose regularly and bring her log to her prenatal and ultrasound visits.   I discussed the importance of fetal echocardiography and encouraged her to make another appointment.  Recommendations -Fetal echocardiography appointment order resent. -An appointment was made for her to return in 4 weeks and 8 weeks for fetal growth assessments.  I counseled the patient with the help of Spanish language interpreter present in the room.  Consultation including face-to-face (more than 50%) counseling 20 minutes.

## 2024-02-25 ENCOUNTER — Other Ambulatory Visit: Payer: Self-pay | Admitting: Obstetrics and Gynecology

## 2024-02-29 ENCOUNTER — Ambulatory Visit (INDEPENDENT_AMBULATORY_CARE_PROVIDER_SITE_OTHER): Payer: Self-pay | Admitting: Pharmacist

## 2024-02-29 ENCOUNTER — Other Ambulatory Visit (HOSPITAL_COMMUNITY): Payer: Self-pay

## 2024-02-29 VITALS — BP 139/77 | HR 91 | Wt 163.3 lb

## 2024-02-29 DIAGNOSIS — O24112 Pre-existing diabetes mellitus, type 2, in pregnancy, second trimester: Secondary | ICD-10-CM

## 2024-02-29 DIAGNOSIS — I10 Essential (primary) hypertension: Secondary | ICD-10-CM

## 2024-02-29 MED ORDER — PRENATAL PLUS 27-1 MG PO TABS
1.0000 | ORAL_TABLET | Freq: Every day | ORAL | 11 refills | Status: DC
  Filled 2024-02-29 – 2024-04-25 (×2): qty 30, 30d supply, fill #0
  Filled 2024-05-26: qty 30, 30d supply, fill #1
  Filled 2024-05-26: qty 30, 30d supply, fill #0
  Filled 2024-06-30: qty 30, 30d supply, fill #1

## 2024-02-29 MED ORDER — NOVOLOG FLEXPEN 100 UNIT/ML ~~LOC~~ SOPN
16.0000 [IU] | PEN_INJECTOR | Freq: Three times a day (TID) | SUBCUTANEOUS | 11 refills | Status: DC
  Filled 2024-02-29: qty 15, 31d supply, fill #0

## 2024-02-29 MED ORDER — INSULIN SYRINGE 31G X 5/16" 0.3 ML MISC
2 refills | Status: DC
  Filled 2024-02-29: qty 100, 25d supply, fill #0

## 2024-02-29 NOTE — Patient Instructions (Addendum)
 I have sent your prescriptions to [facility] at [address]  Please monitor your blood pressure at home and let us know if it is increasing  Please stop your glimeperide at this time  Laural Golden, PharmD, BCACP, CDCES, CPP [address], [address] [place], [place], [address] Phone: [phone], Fax: [phone]

## 2024-02-29 NOTE — Progress Notes (Signed)
 Patient ID: Chloe Perez                 DOB: 20-May-1976                      MRN: [id number]     HPI: Micca Matura is a 48 y.o. female referred to cardio OB clinic. PMH is significant for HTN, T2DM, obesity and advanced materal age.  Patient presents today with interpreter. Understands some English but is more comfortable speaking Spanish.   Reports blood sugar is much better controlled but does not have log or meter. Reports fasting blood sugar of 90 and 2 hour post prandial readings around 115-120. Unfortunately her Novolog was refilled as vials but she did not have any syringes. Is not sure how to inject.  Not currently on any BP lowering medications. BP has been well controlled at recent OB visit.  Wt Readings from Last 3 Encounters:  02/21/24 158 lb (71.7 kg)  01/23/24 157 lb (71.2 kg)  01/07/24 152 lb 14.4 oz (69.4 kg)   BP Readings from Last 3 Encounters:  02/22/24 116/66  02/21/24 132/80  01/23/24 127/69   Pulse Readings from Last 3 Encounters:  02/21/24 89  01/23/24 83  01/23/24 85    Renal function: CrCl cannot be calculated (Patient's most recent lab result is older than the maximum 21 days allowed.).  Past Medical History:  Diagnosis Date   Diabetes mellitus without complication (HCC)    DM type 2 metformin    Female hypogonadism 02/08/2017   Heart pain    Hyperlipidemia associated with type 2 diabetes mellitus (HCC) 10/24/2023   Missed AB    PCOS (polycystic ovarian syndrome) 02/08/2017    Current Outpatient Medications on File Prior to Visit  Medication Sig Dispense Refill   Alcohol Swabs (ALCOHOL PADS) 70 % PADS 1 Pad by Does not apply route 4 (four) times daily. 100 each 2   aspirin EC 81 MG tablet Take 1 tablet (81 mg total) by mouth daily. Swallow whole. 60 tablet 1   Blood Pressure Monitoring (OMRON 3 SERIES BP MONITOR) DEVI Use to take blood pressure as needed 1 each 0   glimepiride (AMARYL) 4 MG tablet Take 1 tablet (4 mg total)  by mouth 2 (two) times daily. 60 tablet 11   insulin aspart (NOVOLOG RELION) 100 UNIT/ML injection Inject 16 Units into the skin 3 (three) times daily before meals. 10 mL 3   insulin NPH Human (NOVOLIN N) 100 UNIT/ML injection Inject 0.13 mLs (13 Units total) into the skin in the morning and at bedtime. 10 mL 11   Insulin Pen Needle 31G X 6 MM MISC 1 Units by Does not apply route 4 (four) times daily. 100 each 2   labetalol (NORMODYNE) 100 MG tablet TAKE 1 TABLET(100 MG) BY MOUTH TWICE DAILY 180 tablet 3   metFORMIN (GLUCOPHAGE-XR) 500 MG 24 hr tablet 2 tabs by mouth twice daily. 120 tablet 11   nitroGLYCERIN (NITROSTAT) 0.4 MG SL tablet 1 tab under tongue as needed for chest pain may repeat every 5 minutes times 2 if pain not relieved 25 tablet 1   prenatal vitamin w/FE, FA (PRENATAL 1 + 1) 27-1 MG TABS tablet 1 tab by mouth daily 30 tablet 11   No current facility-administered medications on file prior to visit.    No Known Allergies   Assessment/Plan:  1. Hypertension -  Patient BP in room 139/77. Will continue to monitor. Requests refill on vitamins.  2. T2DM - Home blood sugar readings appear controlled from patient reports.Does not know how to use insulin vials however. Educated patient on how to store vials, mixing suspension, drawing up dosage, and administering. Will send syringes to [facility] pharmacy and will also send in Novolog pens. Unsure if she can fill yet. Patient voiced understanding. Will have patient d/c glimepiride at this time as it is not recommended in pregnancy.   Laural Golden, PharmD, BCACP, CDCES, CPP [address], [address] [place], [place], [address] Phone: [phone], Fax: [phone]

## 2024-03-03 ENCOUNTER — Other Ambulatory Visit (HOSPITAL_COMMUNITY): Payer: Self-pay

## 2024-03-03 ENCOUNTER — Other Ambulatory Visit: Payer: Self-pay | Admitting: Lactation Services

## 2024-03-03 ENCOUNTER — Encounter: Payer: Self-pay | Admitting: Pharmacist

## 2024-03-03 ENCOUNTER — Telehealth: Payer: Self-pay | Admitting: Licensed Clinical Social Worker

## 2024-03-03 MED ORDER — INSULIN PEN NEEDLE 31G X 6 MM MISC: 1.0000 [IU] | Freq: Four times a day (QID) | 6 refills | Status: DC

## 2024-03-03 NOTE — Telephone Encounter (Signed)
 H&V Care Navigation CSW Progress Note  Clinical Social Worker contacted patient by phone to f/u on referral from pharmacy team for self pay status.  Was unable to reach pt but with assistance from Williams, [place] language interpreter [phone] I left pt a voicemail. Note pt approved for both [facility] Financial Assistance at 100% and [place] Adopt a Mom program. Pt however did not pick up her medications from [facility] and would like to make sure she knew those were available and provide any additional assistance needed.   Will re-attempt as able.   Patient is participating in a Managed Medicaid Plan:  No, self pay , CAFA 100% and Adopt a Mom  CHARITY COMPLETE. PATIENT APPROVED FOR 100% FINANCIAL ASSISTANCE PER CA[employer] APPLICATION AND SUPPORTING DOCUMENTATION. CAFA APP SCANNED TO ACCOUNT [id number] APPROVAL DATES OF [employer]PL ---- 01/01/24 - 06/30/24 APPROVAL LETTER ON ACCOUNT [id number]    SDOH Screenings   Food Insecurity: No Food Insecurity (01/07/2024)  Housing: Low Risk  (10/24/2023)  Transportation Needs: No Transportation Needs (01/07/2024)  Utilities: Not At Risk (10/24/2023)  Depression (PHQ2-9): Low Risk  (01/07/2024)  Financial Resource Strain: Low Risk  (10/24/2023)  Social Connections: Unknown (01/11/2023)   Received from [facility]  Tobacco Use: Medium Risk (03/03/2024)   Octavio Graves, MSW, LCSW Clinical Social Worker II [facility]/Vascular Care Navigation  [phone]- work cell phone (preferred) [phone]- desk phone

## 2024-03-04 ENCOUNTER — Other Ambulatory Visit: Payer: Self-pay

## 2024-03-04 ENCOUNTER — Telehealth: Payer: Self-pay | Admitting: Dietician

## 2024-03-04 NOTE — Telephone Encounter (Signed)
 Called patient with the assistance of [facility] #[phone]. Called her cell phone twice and left a message to call and reschedule her appointment at the [facility] clinic.  Called the other number and she was not available and unable to leave a message.  She did not show for today's appointment.  Oran Rein, RD, LDN, CDCES, DipACLM

## 2024-03-07 ENCOUNTER — Other Ambulatory Visit (HOSPITAL_COMMUNITY): Payer: Self-pay

## 2024-03-07 ENCOUNTER — Other Ambulatory Visit: Payer: Self-pay

## 2024-03-19 ENCOUNTER — Other Ambulatory Visit: Payer: Self-pay

## 2024-03-19 ENCOUNTER — Encounter: Payer: Self-pay | Admitting: Obstetrics & Gynecology

## 2024-03-24 ENCOUNTER — Ambulatory Visit: Payer: Self-pay | Attending: Internal Medicine

## 2024-03-24 ENCOUNTER — Other Ambulatory Visit: Payer: Self-pay

## 2024-04-03 ENCOUNTER — Encounter: Payer: Self-pay | Admitting: Advanced Practice Midwife

## 2024-04-03 ENCOUNTER — Encounter (HOSPITAL_COMMUNITY): Payer: Self-pay | Admitting: Obstetrics and Gynecology

## 2024-04-03 ENCOUNTER — Ambulatory Visit: Payer: Self-pay | Admitting: Advanced Practice Midwife

## 2024-04-03 ENCOUNTER — Inpatient Hospital Stay (HOSPITAL_COMMUNITY)
Admission: AD | Admit: 2024-04-03 | Discharge: 2024-04-08 | DRG: 832 | Disposition: A | Discharge status: Home / Self Care | Payer: MEDICAID | Attending: Family Medicine | Admitting: Family Medicine

## 2024-04-03 ENCOUNTER — Other Ambulatory Visit: Payer: Self-pay | Admitting: Advanced Practice Midwife

## 2024-04-03 ENCOUNTER — Telehealth: Payer: Self-pay | Admitting: Advanced Practice Midwife

## 2024-04-03 ENCOUNTER — Other Ambulatory Visit: Payer: Self-pay

## 2024-04-03 VITALS — BP 147/89 | HR 93 | Wt 165.2 lb

## 2024-04-03 DIAGNOSIS — E1169 Type 2 diabetes mellitus with other specified complication: Secondary | ICD-10-CM

## 2024-04-03 DIAGNOSIS — Z3A30 30 weeks gestation of pregnancy: Secondary | ICD-10-CM

## 2024-04-03 DIAGNOSIS — E785 Hyperlipidemia, unspecified: Secondary | ICD-10-CM | POA: Diagnosis present

## 2024-04-03 DIAGNOSIS — O0993 Supervision of high risk pregnancy, unspecified, third trimester: Secondary | ICD-10-CM

## 2024-04-03 DIAGNOSIS — O10919 Unspecified pre-existing hypertension complicating pregnancy, unspecified trimester: Secondary | ICD-10-CM | POA: Diagnosis present

## 2024-04-03 DIAGNOSIS — Z7982 Long term (current) use of aspirin: Secondary | ICD-10-CM

## 2024-04-03 DIAGNOSIS — O09523 Supervision of elderly multigravida, third trimester: Secondary | ICD-10-CM

## 2024-04-03 DIAGNOSIS — Z7984 Long term (current) use of oral hypoglycemic drugs: Secondary | ICD-10-CM

## 2024-04-03 DIAGNOSIS — O10913 Unspecified pre-existing hypertension complicating pregnancy, third trimester: Secondary | ICD-10-CM

## 2024-04-03 DIAGNOSIS — Z87891 Personal history of nicotine dependence: Secondary | ICD-10-CM

## 2024-04-03 DIAGNOSIS — Z8249 Family history of ischemic heart disease and other diseases of the circulatory system: Secondary | ICD-10-CM

## 2024-04-03 DIAGNOSIS — O24113 Pre-existing diabetes mellitus, type 2, in pregnancy, third trimester: Secondary | ICD-10-CM

## 2024-04-03 DIAGNOSIS — Z98891 History of uterine scar from previous surgery: Secondary | ICD-10-CM

## 2024-04-03 DIAGNOSIS — O09529 Supervision of elderly multigravida, unspecified trimester: Secondary | ICD-10-CM

## 2024-04-03 DIAGNOSIS — Z789 Other specified health status: Secondary | ICD-10-CM

## 2024-04-03 DIAGNOSIS — O34219 Maternal care for unspecified type scar from previous cesarean delivery: Secondary | ICD-10-CM | POA: Diagnosis present

## 2024-04-03 DIAGNOSIS — Z794 Long term (current) use of insulin: Secondary | ICD-10-CM

## 2024-04-03 DIAGNOSIS — E1165 Type 2 diabetes mellitus with hyperglycemia: Principal | ICD-10-CM | POA: Diagnosis present

## 2024-04-03 DIAGNOSIS — Z603 Acculturation difficulty: Secondary | ICD-10-CM | POA: Diagnosis present

## 2024-04-03 DIAGNOSIS — O99213 Obesity complicating pregnancy, third trimester: Secondary | ICD-10-CM | POA: Diagnosis present

## 2024-04-03 DIAGNOSIS — Z833 Family history of diabetes mellitus: Secondary | ICD-10-CM

## 2024-04-03 DIAGNOSIS — O0990 Supervision of high risk pregnancy, unspecified, unspecified trimester: Secondary | ICD-10-CM

## 2024-04-03 LAB — CBC
HCT: 37.4 % (ref 36.0–46.0)
Hemoglobin: 12.9 g/dL (ref 12.0–15.0)
MCH: 28.6 pg (ref 26.0–34.0)
MCHC: 34.5 g/dL (ref 30.0–36.0)
MCV: 82.9 fL (ref 80.0–100.0)
Platelets: 239 10*3/uL (ref 150–400)
RBC: 4.51 MIL/uL (ref 3.87–5.11)
RDW: 12.9 % (ref 11.5–15.5)
WBC: 8.5 10*3/uL (ref 4.0–10.5)
nRBC: 0 % (ref 0.0–0.2)

## 2024-04-03 LAB — URINALYSIS, ROUTINE W REFLEX MICROSCOPIC
Bilirubin Urine: NEGATIVE
Glucose, UA: >=500 mg/dL — AB
Hgb urine dipstick: NEGATIVE
Ketones, ur: 5 mg/dL — AB
Leukocytes,Ua: NEGATIVE
Nitrite: NEGATIVE
Protein, ur: NEGATIVE mg/dL
Specific Gravity, Urine: 1.021 (ref 1.005–1.030)
pH: 5 (ref 5.0–8.0)

## 2024-04-03 LAB — TYPE AND SCREEN
ABO/RH(D): B POS
Antibody Screen: NEGATIVE

## 2024-04-03 LAB — COMPREHENSIVE METABOLIC PANEL WITH GFR
ALT: 29 U/L (ref 0–44)
AST: 30 U/L (ref 15–41)
Albumin: 2.9 g/dL — ABNORMAL LOW (ref 3.5–5.0)
Alkaline Phosphatase: 74 U/L (ref 38–126)
Anion gap: 12 (ref 5–15)
BUN: 11 mg/dL (ref 6–20)
CO2: 17 mmol/L — ABNORMAL LOW (ref 22–32)
Calcium: 8.8 mg/dL — ABNORMAL LOW (ref 8.9–10.3)
Chloride: 101 mmol/L (ref 98–111)
Creatinine, Ser: 0.57 mg/dL (ref 0.44–1.00)
GFR, Estimated: >60 mL/min (ref >60)
Glucose, Bld: 246 mg/dL — ABNORMAL HIGH (ref 70–99)
Potassium: 3.7 mmol/L (ref 3.5–5.1)
Sodium: 130 mmol/L — ABNORMAL LOW (ref 135–145)
Total Bilirubin: 0.4 mg/dL (ref 0.0–1.2)
Total Protein: 6.4 g/dL — ABNORMAL LOW (ref 6.5–8.1)

## 2024-04-03 LAB — HEMOGLOBIN A1C
Hgb A1c MFr Bld: 8.8 % — ABNORMAL HIGH (ref 4.8–5.6)
Mean Plasma Glucose: 205.86 mg/dL

## 2024-04-03 LAB — GLUCOSE, CAPILLARY
Glucose-Capillary: 262 mg/dL — ABNORMAL HIGH (ref 70–99)
Glucose-Capillary: 248 mg/dL — ABNORMAL HIGH (ref 70–99)

## 2024-04-03 LAB — PROTEIN / CREATININE RATIO, URINE
Creatinine, Urine: 56 mg/dL
Protein Creatinine Ratio: 0.16 mg/mg{creat} — ABNORMAL HIGH (ref 0.00–0.15)
Total Protein, Urine: 9 mg/dL

## 2024-04-03 MED ORDER — INSULIN ASPART 100 UNIT/ML IJ SOLN
0.0000 [IU] | Freq: Three times a day (TID) | INTRAMUSCULAR | Status: DC
  Administered 2024-04-03: 6 [IU] via SUBCUTANEOUS
  Administered 2024-04-04: 4 [IU] via SUBCUTANEOUS
  Administered 2024-04-04: 8 [IU] via SUBCUTANEOUS
  Administered 2024-04-04 – 2024-04-05 (×2): 3 [IU] via SUBCUTANEOUS
  Administered 2024-04-05: 2 [IU] via SUBCUTANEOUS
  Administered 2024-04-05 – 2024-04-06 (×2): 3 [IU] via SUBCUTANEOUS
  Administered 2024-04-06: 2 [IU] via SUBCUTANEOUS
  Administered 2024-04-06: 4 [IU] via SUBCUTANEOUS
  Administered 2024-04-07: 3 [IU] via SUBCUTANEOUS
  Administered 2024-04-07: 4 [IU] via SUBCUTANEOUS
  Administered 2024-04-08: 3 [IU] via SUBCUTANEOUS
  Administered 2024-04-08: 1 [IU] via SUBCUTANEOUS

## 2024-04-03 MED ORDER — ACETAMINOPHEN 325 MG PO TABS: 650.0000 mg | ORAL_TABLET | ORAL | Status: DC | PRN

## 2024-04-03 MED ORDER — LABETALOL HCL 100 MG PO TABS: 100.0000 mg | ORAL_TABLET | Freq: Two times a day (BID) | ORAL | 3 refills | Status: DC

## 2024-04-03 MED ORDER — NOVOLIN N FLEXPEN 100 UNIT/ML ~~LOC~~ SUPN: 14.0000 [IU] | PEN_INJECTOR | Freq: Two times a day (BID) | SUBCUTANEOUS | 12 refills | Status: DC

## 2024-04-03 MED ORDER — INSULIN NPH (HUMAN) (ISOPHANE) 100 UNIT/ML ~~LOC~~ SUSP
16.0000 [IU] | Freq: Two times a day (BID) | SUBCUTANEOUS | Status: DC
  Administered 2024-04-03: 16 [IU] via SUBCUTANEOUS

## 2024-04-03 MED ORDER — NOVOLIN R FLEXPEN RELION 100 UNIT/ML IJ SOPN: 14.0000 [IU] | PEN_INJECTOR | Freq: Two times a day (BID) | INTRAMUSCULAR | 6 refills | Status: DC

## 2024-04-03 MED ORDER — INSULIN ASPART 100 UNIT/ML IJ SOLN
18.0000 [IU] | Freq: Three times a day (TID) | INTRAMUSCULAR | Status: DC
  Administered 2024-04-03: 18 [IU] via SUBCUTANEOUS

## 2024-04-03 MED ORDER — PRENATAL MULTIVITAMIN CH
1.0000 | ORAL_TABLET | Freq: Every day | ORAL | Status: DC
Start: 2024-04-04 — End: 2024-04-08
  Administered 2024-04-04 – 2024-04-08 (×5): 1 via ORAL
  Filled 2024-04-03 (×5): qty 1

## 2024-04-03 MED ORDER — LABETALOL HCL 100 MG PO TABS
100.0000 mg | ORAL_TABLET | Freq: Two times a day (BID) | ORAL | Status: DC
  Administered 2024-04-04 – 2024-04-05 (×3): 100 mg via ORAL
  Filled 2024-04-03 (×5): qty 1

## 2024-04-03 MED ORDER — INSULIN ISOPHANE HUMAN 100 UNIT/ML KWIKPEN: 14.0000 [IU] | PEN_INJECTOR | Freq: Two times a day (BID) | SUBCUTANEOUS | Status: DC

## 2024-04-03 MED ORDER — DOCUSATE SODIUM 100 MG PO CAPS
100.0000 mg | ORAL_CAPSULE | Freq: Every day | ORAL | Status: DC
  Administered 2024-04-04 – 2024-04-08 (×5): 100 mg via ORAL
  Filled 2024-04-03 (×5): qty 1

## 2024-04-03 MED ORDER — INSULIN NPH (HUMAN) (ISOPHANE) 100 UNIT/ML ~~LOC~~ SUSP
16.0000 [IU] | Freq: Two times a day (BID) | SUBCUTANEOUS | Status: DC
  Filled 2024-04-03: qty 10

## 2024-04-03 MED ORDER — LACTATED RINGERS IV SOLN: 125.0000 mL/h | INTRAVENOUS | Status: DC

## 2024-04-03 MED ORDER — INSULIN ASPART 100 UNIT/ML FLEXPEN: 16.0000 [IU] | PEN_INJECTOR | Freq: Three times a day (TID) | SUBCUTANEOUS | Status: DC

## 2024-04-03 MED ORDER — NOVOLOG FLEXPEN RELION 100 UNIT/ML ~~LOC~~ SOPN: 16.0000 [IU] | PEN_INJECTOR | Freq: Three times a day (TID) | SUBCUTANEOUS | 12 refills | Status: DC

## 2024-04-03 MED ORDER — CALCIUM CARBONATE ANTACID 500 MG PO CHEW: 2.0000 | CHEWABLE_TABLET | ORAL | Status: DC | PRN

## 2024-04-03 MED ORDER — METFORMIN HCL ER 500 MG PO TB24
1000.0000 mg | ORAL_TABLET | Freq: Two times a day (BID) | ORAL | Status: DC
  Administered 2024-04-03 – 2024-04-08 (×10): 1000 mg via ORAL
  Filled 2024-04-03 (×13): qty 2

## 2024-04-03 MED ORDER — ASPIRIN 81 MG PO TBEC
81.0000 mg | DELAYED_RELEASE_TABLET | Freq: Every day | ORAL | Status: DC
  Administered 2024-04-04 – 2024-04-08 (×5): 81 mg via ORAL
  Filled 2024-04-03 (×5): qty 1

## 2024-04-03 NOTE — Assessment & Plan Note (Signed)
 With interpreter, had detailed discussion regarding the importance of blood pressure control during pregnancy and clarified that she should be taking labetalol  100 mg twice daily.  Refill has been sent to the pharmacy.  Due to two severe range blood pressures in office with third repeat blood pressure still high, recommend reporting to MAU for improved blood pressure control and improved glycemic control given POC glucose level 262.

## 2024-04-03 NOTE — MAU Note (Signed)
 sENT FROM OFFICE FOR ELEVATED B/P AND cbg. PT denies headache or visual changes. Reports good fetal movement and no abd pain or cramping.

## 2024-04-03 NOTE — H&P (Signed)
 FACULTY PRACTICE ANTEPARTUM ADMISSION HISTORY AND PHYSICAL NOTE     History of Present Illness: Chloe Perez is a 48 y.o. [id number] at [redacted]w[redacted]d admitted for diabetes management. Patient with T2DM, A1c of 10.3% in January 2025, currently on Metformin  XR 500mg  bid, NPH 14u bid, Novolog  16u tid. Reports CBG have been 110s-120s fasting and 140s 2h PP.    Patient denies hx cHTN, however this has been noted in her chart. Per her OB provider, pt supposed to be on Labetalol  100mg  bid, but has been taking daily. Pt with severe range BP at her ROB appt earlier today. She reports occasional headaches and vision changes, though none today. Has occasional edema as well, which is not new for her. No SOB, RUQ pain.        Patient Active Problem List    Diagnosis Date Noted   Chronic hypertension during pregnancy, antepartum 01/18/2024   Poor compliance 01/08/2024   Supervision of high risk pregnancy, antepartum 12/19/2023   Heart murmur, systolic 10/24/2023   Obesity (BMI 30.0-34.9) 10/24/2023   History of chest pain 10/24/2023   History of cesarean delivery 05/29/2018   Non-English speaking patient 02/14/2018   Advanced maternal age in multigravida 02/14/2018   Pre-existing type 2 diabetes mellitus in pregnancy 02/08/2017   Type II diabetes mellitus (HCC) 06/03/2012          Past Medical History:  Diagnosis Date   Diabetes mellitus without complication (HCC)      DM type 2 metformin     Female hypogonadism 02/08/2017   Heart pain     Hyperlipidemia associated with type 2 diabetes mellitus (HCC) 10/24/2023   Missed AB     PCOS (polycystic ovarian syndrome) 02/08/2017               Past Surgical History:  Procedure Laterality Date   CESAREAN SECTION N/A 05/28/2018    Procedure: CESAREAN SECTION;  Surgeon: Malka Sea, DO;  Location: [facility];  Service: Obstetrics;  Laterality: N/A;   Uterine polyps        Had two different surgeris to remove--last 7 years ago.                            OB History  Gravida Para Term Preterm AB Living   3 1   1 1 1    SAB IAB Ectopic Multiple Live Births      1       1         # Outcome Date GA Lbr Len/2nd Weight Sex Type Anes PTL Lv  3 Current                    2 Preterm 05/28/18 [redacted]w[redacted]d       CS-Unspec     LIV  1 SAB                        Social History         Socioeconomic History   Marital status: Domestic Partner      Spouse name: Fortunato Ill   Number of children: 1   Years of education: 9   Highest education level: Not on file  Occupational History   Occupation: Hair stylist  Tobacco Use   Smoking status: Former      Current packs/day: 0.00      Types: Cigarettes      Quit date: 09/28/2023  Years since quitting: 0.5   Smokeless tobacco: Never   Tobacco comments:      Discussed nicotine gum and how to use.  Vaping Use   Vaping status: Never Used  Substance and Sexual Activity   Alcohol  use: Not Currently      Comment: sometimes   Drug use: No   Sexual activity: Yes      Birth control/protection: None  Other Topics Concern   Not on file  Social History Narrative    Lives at home with long time female partner and their son.      Social Drivers of Acupuncturist Strain: Low Risk  (10/24/2023)    Overall Financial Resource Strain (CARDIA)     Difficulty of Paying Living Expenses: Not hard at all  Food Insecurity: No Food Insecurity (01/07/2024)    Hunger Vital Sign     Worried About Running Out of Food in the Last Year: Never true     Ran Out of Food in the Last Year: Never true  Transportation Needs: No Transportation Needs (01/07/2024)    PRAPARE - Therapist, art (Medical): No     Lack of Transportation (Non-Medical): No  Physical Activity: Not on file  Stress: Not on file  Social Connections: Unknown (01/11/2023)    Received from [facility]    Social Network     Social Network: Not on file           Family History  Problem  Relation Age of Onset   Hypertension Mother     Diabetes Mother     Heart disease Father            Allergies  No Known Allergies            Medications Prior to Admission  Medication Sig Dispense Refill Last Dose/Taking   aspirin  EC 81 MG tablet Take 1 tablet (81 mg total) by mouth daily. Swallow whole. 60 tablet 1 04/03/2024   metFORMIN  (GLUCOPHAGE -XR) 500 MG 24 hr tablet 2 tabs by mouth twice daily. 120 tablet 11 04/03/2024   prenatal vitamin w/FE, FA (PRENATAL 1 + 1) 27-1 MG TABS tablet Take 1 tablet by mouth daily. 30 tablet 11 04/03/2024   Alcohol  Swabs (ALCOHOL  PADS) 70 % PADS 1 Pad by Does not apply route 4 (four) times daily. 100 each 2     Blood Pressure Monitoring (OMRON 3 SERIES BP MONITOR) DEVI Use to take blood pressure as needed 1 each 0     Insulin  Pen Needle 31G X 6 MM MISC 1 Units by Does not apply route 4 (four) times daily. 100 each 6     nitroGLYCERIN  (NITROSTAT ) 0.4 MG SL tablet 1 tab under tongue as needed for chest pain may repeat every 5 minutes times 2 if pain not relieved (Patient not taking: Reported on 04/03/2024) 25 tablet 1            Review of Systems - Negative except as documented in HPI   Vitals:  BP 126/69   Pulse 89   LMP 08/13/2023 (Approximate)  Physical Examination: CONSTITUTIONAL: Well-developed, well-nourished female in no acute distress.  HENT:  Normocephalic, atraumatic, External right and left ear normal. Oropharynx is clear and moist EYES: Conjunctivae and EOM are normal. Pupils are equal, round, and reactive to light. No scleral icterus.  NECK: Normal range of motion, supple, no masses SKIN: Skin is warm and dry. No rash noted.  Not diaphoretic. No erythema. No pallor. NEUROLGIC: Alert and oriented to person, place, and time. Normal reflexes, muscle tone coordination. No cranial nerve deficit noted. PSYCHIATRIC: Normal mood and affect. Normal behavior. Normal judgment and thought content. CARDIOVASCULAR: Normal heart rate noted, regular  rhythm RESPIRATORY: Effort and breath sounds normal, no problems with respiration noted ABDOMEN: Soft, nontender, nondistended, gravid. MUSCULOSKELETAL: Normal range of motion. No edema and no tenderness. 2+ distal pulses.     Labs:       Results for orders placed or performed during the hospital encounter of 04/03/24 (from the past 24 hours)  Protein / creatinine ratio, urine    Collection Time: 04/03/24  4:46 PM  Result Value Ref Range    Creatinine, Urine 56 mg/dL    Total Protein, Urine 9 mg/dL    Protein Creatinine Ratio 0.16 (H) 0.00 - 0.15 mg/mg[Cre]  CBC    Collection Time: 04/03/24  4:51 PM  Result Value Ref Range    WBC 8.5 4.0 - 10.5 K/uL    RBC 4.51 3.87 - 5.11 MIL/uL    Hemoglobin 12.9 12.0 - 15.0 g/dL    HCT 14.7 82.9 - 56.2 %    MCV 82.9 80.0 - 100.0 fL    MCH 28.6 26.0 - 34.0 pg    MCHC 34.5 30.0 - 36.0 g/dL    RDW 13.0 86.5 - 78.4 %    Platelets 239 150 - 400 K/uL    nRBC 0.0 0.0 - 0.2 %  Comprehensive metabolic panel with GFR    Collection Time: 04/03/24  4:51 PM  Result Value Ref Range    Sodium 130 (L) 135 - 145 mmol/L    Potassium 3.7 3.5 - 5.1 mmol/L    Chloride 101 98 - 111 mmol/L    CO2 17 (L) 22 - 32 mmol/L    Glucose, Bld 246 (H) 70 - 99 mg/dL    BUN 11 6 - 20 mg/dL    Creatinine, Ser 6.96 0.44 - 1.00 mg/dL    Calcium  8.8 (L) 8.9 - 10.3 mg/dL    Total Protein 6.4 (L) 6.5 - 8.1 g/dL    Albumin 2.9 (L) 3.5 - 5.0 g/dL    AST 30 15 - 41 U/L    ALT 29 0 - 44 U/L    Alkaline Phosphatase 74 38 - 126 U/L    Total Bilirubin 0.4 0.0 - 1.2 mg/dL    GFR, Estimated >29 >52 mL/min    Anion gap 12 5 - 15  Results for orders placed or performed in visit on 04/03/24 (from the past 24 hours)  Glucose, capillary    Collection Time: 04/03/24  2:15 PM  Result Value Ref Range    Glucose-Capillary 262 (H) 70 - 99 mg/dL      Imaging Studies: Imaging Results  No results found.       Assessment and Plan:     Patient Active Problem List    Diagnosis Date  Noted   Chronic hypertension during pregnancy, antepartum 01/18/2024   Poor compliance 01/08/2024   Supervision of high risk pregnancy, antepartum 12/19/2023   Heart murmur, systolic 10/24/2023   Obesity (BMI 30.0-34.9) 10/24/2023   History of chest pain 10/24/2023   History of cesarean delivery 05/29/2018   Non-English speaking patient 02/14/2018   Advanced maternal age in multigravida 02/14/2018   Pre-existing type 2 diabetes mellitus in pregnancy 02/08/2017   Type II diabetes mellitus (HCC) 06/03/2012    #T2DM: Uncontrolled, last A1c 10.3% in January, repeat  ordered  admit for better manage and optimize glycemic control  some question as to if pt has been able to afford/take insulin  and Metformin  as prescribed, may benefit from SW assistance with affording meds             - Home regimen: NPH 14u/14u, Novolog  16u TID -- will start at NPH 16u/16u, Novolog  18u TID             - SSI ACTID             - DM coordinator consult   #cHTN: Uncontrolled, had severe range BP in clinic today, had mild range BP on admit to MAU, but overall normotensive  PEC labs obtained and were wnl, PCR 0.16  has been taking Labetalol  100mg  every day instead of BID, will resume home dosing and adjust as needed  no si/sx severe pre-e   #Pregnancy at [redacted]w[redacted]d: Routine care  NST daily   #AMA   Melanie Spires, MD OB Fellow, Faculty Practice [facility]

## 2024-04-03 NOTE — MAU Provider Note (Signed)
 FACULTY PRACTICE ANTEPARTUM ADMISSION HISTORY AND PHYSICAL NOTE   History of Present Illness: Chloe Perez is a 48 y.o. [id number] at [redacted]w[redacted]d admitted for diabetes management. Patient with T2DM, A1c of 10.3% in January 2025, currently on Metformin  XR 500mg  bid, NPH 14u bid, Novolog  16u tid. Reports CBG have been 110s-120s fasting and 140s 2h PP.   Patient denies hx cHTN, however this has been noted in her chart. Per her OB provider, pt supposed to be on Labetalol  100mg  bid, but has been taking daily. Pt with severe range BP at her ROB appt earlier today. She reports occasional headaches and vision changes, though none today. Has occasional edema as well, which is not new for her. No SOB, RUQ pain.   Patient Active Problem List   Diagnosis Date Noted   Chronic hypertension during pregnancy, antepartum 01/18/2024   Poor compliance 01/08/2024   Supervision of high risk pregnancy, antepartum 12/19/2023   Heart murmur, systolic 10/24/2023   Obesity (BMI 30.0-34.9) 10/24/2023   History of chest pain 10/24/2023   History of cesarean delivery 05/29/2018   Non-English speaking patient 02/14/2018   Advanced maternal age in multigravida 02/14/2018   Pre-existing type 2 diabetes mellitus in pregnancy 02/08/2017   Type II diabetes mellitus (HCC) 06/03/2012    Past Medical History:  Diagnosis Date   Diabetes mellitus without complication (HCC)    DM type 2 metformin     Female hypogonadism 02/08/2017   Heart pain    Hyperlipidemia associated with type 2 diabetes mellitus (HCC) 10/24/2023   Missed AB    PCOS (polycystic ovarian syndrome) 02/08/2017    Past Surgical History:  Procedure Laterality Date   CESAREAN SECTION N/A 05/28/2018   Procedure: CESAREAN SECTION;  Surgeon: Malka Sea, DO;  Location: [facility];  Service: Obstetrics;  Laterality: N/A;   Uterine polyps     Had two different surgeris to remove--last 7 years ago.    OB History  Gravida Para Term Preterm AB  Living  3 1  1 1 1   SAB IAB Ectopic Multiple Live Births  1    1    # Outcome Date GA Lbr Len/2nd Weight Sex Type Anes PTL Lv  3 Current           2 Preterm 05/28/18 [redacted]w[redacted]d    CS-Unspec   LIV  1 SAB             Social History   Socioeconomic History   Marital status: Media planner    Spouse name: Fortunato Ill   Number of children: 1   Years of education: 9   Highest education level: Not on file  Occupational History   Occupation: Hair stylist  Tobacco Use   Smoking status: Former    Current packs/day: 0.00    Types: Cigarettes    Quit date: 09/28/2023    Years since quitting: 0.5   Smokeless tobacco: Never   Tobacco comments:    Discussed nicotine gum and how to use.  Vaping Use   Vaping status: Never Used  Substance and Sexual Activity   Alcohol  use: Not Currently    Comment: sometimes   Drug use: No   Sexual activity: Yes    Birth control/protection: None  Other Topics Concern   Not on file  Social History Narrative   Lives at home with long time female partner and their son.     Social Drivers of Health   Financial Resource Strain: Low Risk  (10/24/2023)   Overall Financial  Resource Strain (CARDIA)    Difficulty of Paying Living Expenses: Not hard at all  Food Insecurity: No Food Insecurity (01/07/2024)   Hunger Vital Sign    Worried About Running Out of Food in the Last Year: Never true    Ran Out of Food in the Last Year: Never true  Transportation Needs: No Transportation Needs (01/07/2024)   PRAPARE - Administrator, Civil Service (Medical): No    Lack of Transportation (Non-Medical): No  Physical Activity: Not on file  Stress: Not on file  Social Connections: Unknown (01/11/2023)   Received from [facility]   Social Network    Social Network: Not on file    Family History  Problem Relation Age of Onset   Hypertension Mother    Diabetes Mother    Heart disease Father     No Known Allergies  Medications Prior to Admission  Medication  Sig Dispense Refill Last Dose/Taking   aspirin  EC 81 MG tablet Take 1 tablet (81 mg total) by mouth daily. Swallow whole. 60 tablet 1 04/03/2024   metFORMIN  (GLUCOPHAGE -XR) 500 MG 24 hr tablet 2 tabs by mouth twice daily. 120 tablet 11 04/03/2024   prenatal vitamin w/FE, FA (PRENATAL 1 + 1) 27-1 MG TABS tablet Take 1 tablet by mouth daily. 30 tablet 11 04/03/2024   Alcohol  Swabs (ALCOHOL  PADS) 70 % PADS 1 Pad by Does not apply route 4 (four) times daily. 100 each 2    Blood Pressure Monitoring (OMRON 3 SERIES BP MONITOR) DEVI Use to take blood pressure as needed 1 each 0    Insulin  Pen Needle 31G X 6 MM MISC 1 Units by Does not apply route 4 (four) times daily. 100 each 6    nitroGLYCERIN  (NITROSTAT ) 0.4 MG SL tablet 1 tab under tongue as needed for chest pain may repeat every 5 minutes times 2 if pain not relieved (Patient not taking: Reported on 04/03/2024) 25 tablet 1     Review of Systems - Negative except as documented in HPI  Vitals:  BP 126/69   Pulse 89   LMP 08/13/2023 (Approximate)  Physical Examination: CONSTITUTIONAL: Well-developed, well-nourished female in no acute distress.  HENT:  Normocephalic, atraumatic, External right and left ear normal. Oropharynx is clear and moist EYES: Conjunctivae and EOM are normal. Pupils are equal, round, and reactive to light. No scleral icterus.  NECK: Normal range of motion, supple, no masses SKIN: Skin is warm and dry. No rash noted. Not diaphoretic. No erythema. No pallor. NEUROLGIC: Alert and oriented to person, place, and time. Normal reflexes, muscle tone coordination. No cranial nerve deficit noted. PSYCHIATRIC: Normal mood and affect. Normal behavior. Normal judgment and thought content. CARDIOVASCULAR: Normal heart rate noted, regular rhythm RESPIRATORY: Effort and breath sounds normal, no problems with respiration noted ABDOMEN: Soft, nontender, nondistended, gravid. MUSCULOSKELETAL: Normal range of motion. No edema and no tenderness. 2+  distal pulses.   Labs:  Results for orders placed or performed during the hospital encounter of 04/03/24 (from the past 24 hours)  Protein / creatinine ratio, urine   Collection Time: 04/03/24  4:46 PM  Result Value Ref Range   Creatinine, Urine 56 mg/dL   Total Protein, Urine 9 mg/dL   Protein Creatinine Ratio 0.16 (H) 0.00 - 0.15 mg/mg[Cre]  CBC   Collection Time: 04/03/24  4:51 PM  Result Value Ref Range   WBC 8.5 4.0 - 10.5 K/uL   RBC 4.51 3.87 - 5.11 MIL/uL   Hemoglobin 12.9 12.0 -  15.0 g/dL   HCT 16.1 09.6 - 04.5 %   MCV 82.9 80.0 - 100.0 fL   MCH 28.6 26.0 - 34.0 pg   MCHC 34.5 30.0 - 36.0 g/dL   RDW 40.9 81.1 - 91.4 %   Platelets 239 150 - 400 K/uL   nRBC 0.0 0.0 - 0.2 %  Comprehensive metabolic panel with GFR   Collection Time: 04/03/24  4:51 PM  Result Value Ref Range   Sodium 130 (L) 135 - 145 mmol/L   Potassium 3.7 3.5 - 5.1 mmol/L   Chloride 101 98 - 111 mmol/L   CO2 17 (L) 22 - 32 mmol/L   Glucose, Bld 246 (H) 70 - 99 mg/dL   BUN 11 6 - 20 mg/dL   Creatinine, Ser 7.82 0.44 - 1.00 mg/dL   Calcium  8.8 (L) 8.9 - 10.3 mg/dL   Total Protein 6.4 (L) 6.5 - 8.1 g/dL   Albumin 2.9 (L) 3.5 - 5.0 g/dL   AST 30 15 - 41 U/L   ALT 29 0 - 44 U/L   Alkaline Phosphatase 74 38 - 126 U/L   Total Bilirubin 0.4 0.0 - 1.2 mg/dL   GFR, Estimated >95 >62 mL/min   Anion gap 12 5 - 15  Results for orders placed or performed in visit on 04/03/24 (from the past 24 hours)  Glucose, capillary   Collection Time: 04/03/24  2:15 PM  Result Value Ref Range   Glucose-Capillary 262 (H) 70 - 99 mg/dL    Imaging Studies: No results found.   Assessment and Plan: Patient Active Problem List   Diagnosis Date Noted   Chronic hypertension during pregnancy, antepartum 01/18/2024   Poor compliance 01/08/2024   Supervision of high risk pregnancy, antepartum 12/19/2023   Heart murmur, systolic 10/24/2023   Obesity (BMI 30.0-34.9) 10/24/2023   History of chest pain 10/24/2023   History  of cesarean delivery 05/29/2018   Non-English speaking patient 02/14/2018   Advanced maternal age in multigravida 02/14/2018   Pre-existing type 2 diabetes mellitus in pregnancy 02/08/2017   Type II diabetes mellitus (HCC) 06/03/2012   #T2DM: Uncontrolled, last A1c 10.3% in January, repeat ordered  admit for better manage and optimize glycemic control  some question as to if pt has been able to afford/take insulin  and Metformin  as prescribed, may benefit from SW assistance with affording meds  - Home regimen: NPH 14u/14u, Novolog  16u TID -- will start at NPH 16u/16u, Novolog  18u TID  - SSI ACTID  - DM coordinator consult  #cHTN: Uncontrolled, had severe range BP in clinic today, had mild range BP on admit to MAU, but overall normotensive  PEC labs obtained and were wnl, PCR 0.16  has been taking Labetalol  100mg  every day instead of BID, will resume home dosing and adjust as needed  no si/sx severe pre-e  #Pregnancy at [redacted]w[redacted]d: Routine care  NST daily  #AMA  Melanie Spires, MD OB Fellow, Faculty Practice [facility], Center for [facility]

## 2024-04-03 NOTE — Progress Notes (Signed)
 PRENATAL VISIT NOTE  Subjective:  Due to language barrier, an interpreter was present during the history-taking, physical exam, and subsequent discussion with this patient.   Chloe Perez is a 48 y.o. [id number] at [redacted]w[redacted]d being seen today for ongoing prenatal care.  She is currently monitored for the following issues for this high-risk pregnancy and has Type II diabetes mellitus (HCC); Pre-existing type 2 diabetes mellitus in pregnancy; Non-English speaking patient; Advanced maternal age in multigravida; History of cesarean delivery; Heart murmur, systolic; Obesity (BMI 30.0-34.9); History of chest pain; Supervision of high risk pregnancy, antepartum; Poor compliance; and Chronic hypertension during pregnancy, antepartum on their problem list.   Patient reports pelvic pressure .  Contractions: Irritability. Vag. Bleeding: None.  Movement: Present. Denies leaking of fluid.   Patient reports that she would like a prescription sent in for NovoLog  FlexPen.  She prefers pen over vials so that she does not need to dose and inject using a syringe.  She picked up pens the first time she used insulin , but when she tried to pick these up again the pharmacy told her that they could not give it to her because she does not have insurance.  She did not bring her glucose log today.  After initial elevated blood pressure, clarified what home medications she is taking.  She reports that she has taken aspirin  twice a day and labetalol  once a day.  Her most recent dose of labetalol  was yesterday evening.  Denies headache, vision changes, abdominal pain.  The following portions of the patient's history were reviewed and updated as appropriate: allergies, current medications, past family history, past medical history, past social history, past surgical history and problem list.   Objective:   Vitals:   04/03/24 1326 04/03/24 1351 04/03/24 1409  BP: (!) 160/79 (!) 164/82 (!) 147/89  Pulse: 87 93 93   Weight: 165 lb 3.2 oz (74.9 kg)      Fetal Status: Fetal Heart Rate (bpm): 149   Movement: Present      General:  Alert, oriented and cooperative. Patient is in no acute distress.  Skin: Skin is warm and dry. No rash noted.   Cardiovascular: Normal heart rate noted  Respiratory: Normal respiratory effort, no problems with respiration noted  Abdomen: Soft, gravid, appropriate for gestational age.  Pain/Pressure: Present     Pelvic: Cervical exam deferred        Extremities: Normal range of motion.     Mental Status: Normal mood and affect. Normal behavior. Normal judgment and thought content.   US  3/28: Est. FW: 903 gm 2 lb 97 %   Assessment and Plan:  Pregnancy: [id number] at [redacted]w[redacted]d 1. Supervision of high risk pregnancy, antepartum (Primary) FHR normal Overall feeling well, feeling regular movement  2. Chronic hypertension during pregnancy, antepartum With interpreter, had detailed discussion regarding the importance of blood pressure control during pregnancy and clarified that she should be taking labetalol  100 mg twice daily.  Refill has been sent to the pharmacy.  Due to two severe range blood pressures in office with third repeat blood pressure still high, recommend reporting to MAU for improved blood pressure control and improved glycemic control given POC glucose level 262.  - labetalol  (NORMODYNE ) 100 MG tablet; Take 1 tablet (100 mg total) by mouth 2 (two) times daily.  Dispense: 180 tablet; Refill: 3  3. Pre-existing type 2 diabetes mellitus during pregnancy in third trimester Will work on contacting pharmacy for clarity regarding insulin  vial versus FlexPen, patient verbalized being  okay with cost of FlexPen out-of-pocket and preference for pen due to ease of dosing. Discussed the importance of good glycemic control in pregnancy. POC glucose in office 262. Glucose level in combination with elevated blood pressures in office prompted recommendation to go to MAU. Patient verbalized  understanding.   4. Non-English speaking patient  5. History of cesarean delivery  6. Multigravida of advanced maternal age in third trimester ASA 81 mg daily. Followed by MFM.  7. [redacted] weeks gestation of pregnancy     Preterm labor symptoms and general obstetric precautions including but not limited to vaginal bleeding, contractions, leaking of fluid and fetal movement were reviewed in detail with the patient. Please refer to After Visit Summary for other counseling recommendations.   Return in about 2 weeks (around 04/17/2024) for [facility].  Future Appointments  Date Time Provider Department Center  04/18/2024  8:00 AM [facility] PROVIDER 1 [facility]  04/18/2024  8:30 AM [facility] US4 [facility]  04/23/2024  1:15 PM Davis, Devon E, PA-C [facility]  05/01/2024  4:00 PM Ronalee Cocking, MD [facility] None    Noreene Bearded, [place] [facility] for [employer]

## 2024-04-03 NOTE — Assessment & Plan Note (Signed)
 Will work on Systems developer for clarity regarding insulin  vial versus FlexPen, patient verbalized being okay with cost of FlexPen out-of-pocket and preference for pen due to ease of dosing.  Discussed the importance of good glycemic control in pregnancy.  POC glucose in office 262.  Glucose level in combination with elevated blood pressures in office prompted recommendation to go to MAU.  Patient verbalized understanding.

## 2024-04-03 NOTE — Telephone Encounter (Signed)
 Called [facility] pharmacy due to pt strongly preferring insulin  pens and having difficulties picking up prescription sent by other providers. Per pharmacist [facility] has discount prices as follows:  - Novolin N Relion Flexpen is $42 for box of 5 - Novolog  Flexpen is $83 for box of 5.

## 2024-04-03 NOTE — Progress Notes (Signed)
 Gave Pt Maternity Belt CBG was 262

## 2024-04-04 ENCOUNTER — Inpatient Hospital Stay (HOSPITAL_BASED_OUTPATIENT_CLINIC_OR_DEPARTMENT_OTHER): Payer: MEDICAID

## 2024-04-04 DIAGNOSIS — E1165 Type 2 diabetes mellitus with hyperglycemia: Principal | ICD-10-CM | POA: Diagnosis present

## 2024-04-04 DIAGNOSIS — O3663X0 Maternal care for excessive fetal growth, third trimester, not applicable or unspecified: Secondary | ICD-10-CM

## 2024-04-04 DIAGNOSIS — Z794 Long term (current) use of insulin: Secondary | ICD-10-CM

## 2024-04-04 DIAGNOSIS — Z3A30 30 weeks gestation of pregnancy: Secondary | ICD-10-CM

## 2024-04-04 DIAGNOSIS — O10013 Pre-existing essential hypertension complicating pregnancy, third trimester: Secondary | ICD-10-CM

## 2024-04-04 DIAGNOSIS — E119 Type 2 diabetes mellitus without complications: Secondary | ICD-10-CM

## 2024-04-04 DIAGNOSIS — O09523 Supervision of elderly multigravida, third trimester: Secondary | ICD-10-CM

## 2024-04-04 DIAGNOSIS — O24113 Pre-existing diabetes mellitus, type 2, in pregnancy, third trimester: Secondary | ICD-10-CM

## 2024-04-04 LAB — GLUCOSE, CAPILLARY
Glucose-Capillary: 297 mg/dL — ABNORMAL HIGH (ref 70–99)
Glucose-Capillary: 319 mg/dL — ABNORMAL HIGH (ref 70–99)
Glucose-Capillary: 157 mg/dL — ABNORMAL HIGH (ref 70–99)
Glucose-Capillary: 225 mg/dL — ABNORMAL HIGH (ref 70–99)

## 2024-04-04 MED ORDER — INSULIN ASPART 100 UNIT/ML IJ SOLN
20.0000 [IU] | Freq: Three times a day (TID) | INTRAMUSCULAR | Status: DC
  Administered 2024-04-04: 20 [IU] via SUBCUTANEOUS

## 2024-04-04 MED ORDER — INSULIN NPH (HUMAN) (ISOPHANE) 100 UNIT/ML ~~LOC~~ SUSP
18.0000 [IU] | Freq: Two times a day (BID) | SUBCUTANEOUS | Status: DC
  Administered 2024-04-04: 18 [IU] via SUBCUTANEOUS

## 2024-04-04 MED ORDER — INSULIN ASPART 100 UNIT/ML IJ SOLN
25.0000 [IU] | Freq: Three times a day (TID) | INTRAMUSCULAR | Status: DC
  Administered 2024-04-04 – 2024-04-07 (×9): 25 [IU] via SUBCUTANEOUS

## 2024-04-04 MED ORDER — INSULIN NPH (HUMAN) (ISOPHANE) 100 UNIT/ML ~~LOC~~ SUSP
25.0000 [IU] | Freq: Two times a day (BID) | SUBCUTANEOUS | Status: DC
  Administered 2024-04-04 – 2024-04-05 (×2): 25 [IU] via SUBCUTANEOUS

## 2024-04-04 MED ORDER — LABETALOL HCL 200 MG PO TABS
200.0000 mg | ORAL_TABLET | Freq: Once | ORAL | Status: AC
  Administered 2024-04-04: 200 mg via ORAL
  Filled 2024-04-04: qty 1

## 2024-04-04 MED ORDER — INSULIN NPH (HUMAN) (ISOPHANE) 100 UNIT/ML ~~LOC~~ SUSP: 22.0000 [IU] | Freq: Two times a day (BID) | SUBCUTANEOUS | Status: DC

## 2024-04-04 NOTE — Plan of Care (Signed)
 Nutrition Education Note  RD consulted for nutrition education regarding uncontrolled T2DM in pregnancy  Lab Results  Component Value Date   HGBA1C 8.8 (H) 04/03/2024    48 y.o. [id number] at [redacted]w[redacted]d who is admitted for uncontrolled T2DM   Met with patient at bedside. Notified by unit that patient may not require Spanish interpreter. Brought iPad interpreter in room to complete assessment and education, however patient declined use of interpreter and reported she could complete assessment and education in [place]. Pt reports she has previously met with outpatient dietitians and learned the plate method as a strategy for building well-balanced meals and eating consistent carbohydrates throughout the day. She reports she has a good appetite. She initially reported eating 2 meals + 2 snacks, but then later when working through food recall she reports eating more often than this. She reports she typically cooks once and will eat the same foods throughout the day. She does not like beans or many vegetables, which has made it difficult to truly follow plate method (does not fill 1/2 plate vegetables, but will try to stick with 1/4 plate CHO and 1/4 plate protein). She reports she probably only eats a vegetable about once every 2 weeks. She will occasionally eat cucumber, salad, or will make a spinach smoothie (spinach mixed with water and blended). She typically only drinks water but reports drinking regular soda twice in the past week. She would like to work on drinking only water or sugar-free beverages. She reports she is going to try coffee with sugar substitute instead of regular sugar.   Typical intake: 6AM: coffee with cream and sugar, 2 tacos (corn tortilla with chicken, pork, or egg) 9-10AM: bread or tortilla or rice with chicken, pork, beef, or egg 2-3PM: pork or beef or chicken with rice or tortilla 6-9PM: pork or beef or chicken with rice or tortilla 3-4AM snack: apple or melon or  oranges Beverages: water, coffee with sugar and cream in AM and PM; previously no sugar or juice but reports over the past week had 8-12 fl oz soda x 2  RD provided "My Food Plan" handout from the [facility] in Spanish. Discussed different food groups and their effects on blood sugar, emphasizing carbohydrate-containing foods. Provided list of carbohydrates and recommended serving sizes of common foods.   Discussed importance of controlled and consistent carbohydrate intake throughout the day. Provided examples of ways to balance meals/snacks and encouraged intake of high-fiber, whole grain complex carbohydrates. Also discussed importance of adequate protein. Encouraged intake of water or sugar-free beverages and discussed limiting sugar-sweetened beverages. Teach back method used.  Recommended Nutrition Prescription for GDM: Breakfast: 30-45 grams carbohydrates Morning Snack: 15-30 grams carbohydrates Lunch: 50-60 grams carbohydrates Afternoon Snack: 15-30 grams carbohydrates Dinner: 50-60 grams of carbohydrates Evening Snack: 15-30 grams of carbohydrates  Discussed that carbohydrate counting is an alternative way of portioning plate and thinking about keeping CHO intake consistent throughout the week. Discussed that if pt prefers to keep using the plate method instead, that she can also keep using that method that she previously learned. Answered patient's specific questions about meals she eats at home and how to make them more balanced. Discussed strategies for including a good source of protein and then also discussed vegetables that patient does like to eat she can include at meals as she does not regularly consume vegetables.  Expect good compliance.  Current diet order is gestational carbohydrate modified. Patient may have double protein portions if requested when ordering. Also discussed GDM  snacks available TID. Labs and medications reviewed. No further nutrition  interventions warranted at this time. If additional nutrition issues arise, please re-consult RD.  Stefanos Haynesworth King Shermar Friedland, MS, RD, LDN, CNSC If unable to reach, please contact "NICU RD" secure chat group between 7 am-3 pm daily

## 2024-04-04 NOTE — Progress Notes (Signed)
 FACULTY PRACTICE ANTEPARTUM COMPREHENSIVE PROGRESS NOTE  Chloe Perez is a 48 y.o. [id number] at [redacted]w[redacted]d who is admitted for uncontrolled T2DM.  Estimated Date of Delivery: 06/09/24 Fetal presentation is unsure.  Length of Stay:  1 Days. Admitted 04/03/2024  Subjective: Doing well. Reports gets hungry during the night and eats around 3-4 am.   Patient reports good fetal movement.  She reports no uterine contractions, no bleeding and no loss of fluid per vagina.  Vitals:  Blood pressure 137/64, pulse 83, temperature 98.1 F (36.7 C), temperature source Oral, resp. rate 18, last menstrual period 08/13/2023, SpO2 100%. Physical Examination: CONSTITUTIONAL: Well-developed, well-nourished female in no acute distress.  NEUROLOGIC: Alert and oriented to person, place, and time. No cranial nerve deficit noted. PSYCHIATRIC: Normal mood and affect. Normal behavior. Normal judgment and thought content. CARDIOVASCULAR: Normal heart rate noted, regular rhythm RESPIRATORY: Effort and breath sounds normal, no problems with respiration noted MUSCULOSKELETAL: Normal range of motion. No edema and no tenderness. 2+ distal pulses. ABDOMEN: Soft, nontender, nondistended, gravid. CERVIX:  Not performed  Fetal monitoring: FHR: 150 bpm, Variability: moderate, Accelerations: Present 10x10, Decelerations: Absent  Uterine activity: no contractions per hour  Results for orders placed or performed during the hospital encounter of 04/03/24 (from the past 48 hours)  Protein / creatinine ratio, urine     Status: Abnormal   Collection Time: 04/03/24  4:46 PM  Result Value Ref Range   Creatinine, Urine 56 mg/dL   Total Protein, Urine 9 mg/dL    Comment: NO NORMAL RANGE ESTABLISHED FOR THIS TEST   Protein Creatinine Ratio 0.16 (H) 0.00 - 0.15 mg/mg[Cre]    Comment: Performed at [facility], [address], [place], [place] [address]  Urinalysis, Routine w reflex microscopic -Urine, Clean Catch     Status:  Abnormal   Collection Time: 04/03/24  4:46 PM  Result Value Ref Range   Color, Urine YELLOW YELLOW   APPearance HAZY (A) CLEAR   Specific Gravity, Urine 1.021 1.005 - 1.030   pH 5.0 5.0 - 8.0   Glucose, UA >=500 (A) NEGATIVE mg/dL   Hgb urine dipstick NEGATIVE NEGATIVE   Bilirubin Urine NEGATIVE NEGATIVE   Ketones, ur 5 (A) NEGATIVE mg/dL   Protein, ur NEGATIVE NEGATIVE mg/dL   Nitrite NEGATIVE NEGATIVE   Leukocytes,Ua NEGATIVE NEGATIVE   RBC / HPF 0-5 0 - 5 RBC/hpf   WBC, UA 0-5 0 - 5 WBC/hpf   Bacteria, UA RARE (A) NONE SEEN   Squamous Epithelial / HPF 6-10 0 - 5 /HPF   Mucus PRESENT     Comment: Performed at [facility], [address], [place], [place] [address]  CBC     Status: None   Collection Time: 04/03/24  4:51 PM  Result Value Ref Range   WBC 8.5 4.0 - 10.5 K/uL   RBC 4.51 3.87 - 5.11 MIL/uL   Hemoglobin 12.9 12.0 - 15.0 g/dL   HCT 13.0 86.5 - 78.4 %   MCV 82.9 80.0 - 100.0 fL   MCH 28.6 26.0 - 34.0 pg   MCHC 34.5 30.0 - 36.0 g/dL   RDW 69.6 29.5 - 28.4 %   Platelets 239 150 - 400 K/uL   nRBC 0.0 0.0 - 0.2 %    Comment: Performed at [facility], [address], [place], [place] [address]  Comprehensive metabolic panel with GFR     Status: Abnormal   Collection Time: 04/03/24  4:51 PM  Result Value Ref  Range   Sodium 130 (L) 135 - 145 mmol/L   Potassium 3.7 3.5 - 5.1 mmol/L   Chloride 101 98 - 111 mmol/L   CO2 17 (L) 22 - 32 mmol/L   Glucose, Bld 246 (H) 70 - 99 mg/dL    Comment: Glucose reference range applies only to samples taken after fasting for at least 8 hours.   BUN 11 6 - 20 mg/dL   Creatinine, Ser 5.62 0.44 - 1.00 mg/dL   Calcium  8.8 (L) 8.9 - 10.3 mg/dL   Total Protein 6.4 (L) 6.5 - 8.1 g/dL   Albumin 2.9 (L) 3.5 - 5.0 g/dL   AST 30 15 - 41 U/L   ALT 29 0 - 44 U/L   Alkaline Phosphatase 74 38 - 126 U/L   Total Bilirubin 0.4 0.0 - 1.2 mg/dL   GFR, Estimated >13 >08 mL/min    Comment: (NOTE) Calculated using the CKD-EPI Creatinine  Equation (2021)    Anion gap 12 5 - 15    Comment: Performed at [facility], [address] [place], [place] [address]  Hemoglobin A1c     Status: Abnormal   Collection Time: 04/03/24  7:00 PM  Result Value Ref Range   Hgb A1c MFr Bld 8.8 (H) 4.8 - 5.6 %    Comment: (NOTE) Pre diabetes:          5.7%-6.4%  Diabetes:              >6.4%  Glycemic control for   <7.0% adults with diabetes    Mean Plasma Glucose 205.86 mg/dL    Comment: Performed at [facility], [address] [place], [place] [address]  Type and screen [facility]     Status: None   Collection Time: 04/03/24  7:12 PM  Result Value Ref Range   ABO/RH(D) B POS    Antibody Screen NEG    Sample Expiration      04/06/2024,2359 Performed at [facility], [address], [place], [place] [address]   Glucose, capillary     Status: Abnormal   Collection Time: 04/03/24 11:20 PM  Result Value Ref Range   Glucose-Capillary 248 (H) 70 - 99 mg/dL    Comment: Glucose reference range applies only to samples taken after fasting for at least 8 hours.  Glucose, capillary     Status: Abnormal   Collection Time: 04/04/24  6:01 AM  Result Value Ref Range   Glucose-Capillary 297 (H) 70 - 99 mg/dL    Comment: Glucose reference range applies only to samples taken after fasting for at least 8 hours.  Glucose, capillary     Status: Abnormal   Collection Time: 04/04/24 11:00 AM  Result Value Ref Range   Glucose-Capillary 319 (H) 70 - 99 mg/dL    Comment: Glucose reference range applies only to samples taken after fasting for at least 8 hours.    No results found.  Current scheduled medications  aspirin  EC  81 mg Oral Daily   docusate sodium   100 mg Oral Daily   insulin  aspart  0-14 Units Subcutaneous TID PC   insulin  aspart  25 Units Subcutaneous TID WC   insulin  NPH Human  25 Units Subcutaneous BID AC & HS   labetalol   100 mg Oral BID   metFORMIN   1,000 mg Oral BID WC   prenatal  multivitamin  1 tablet Oral Q1200    I have reviewed the patient's current medications.  ASSESSMENT: Principal Problem:   Uncontrolled diabetes mellitus with hyperglycemia (HCC)   PLAN: T2DM, uncontrolled - Prior to admission, was on NPH 14, R16 - A1C was 8.8 (down from 10.3) - Current regimen is NPH 25, Novolog  25/25/25. She is also on MTF 1000 bid.  - Appreciate DM educator's rec's. She is also getting set up with free style libre. CBGs will be logged under flowsheet, CGM Device Flowsheet.  - Discussed holding off on 3-4am snack and trying protein based snack before bed. If she does wake to eat, we need to check fasting CBG before she eats. Patient will try not to eat tonight so we can get a routine fasting.   CHTN - BP well controlled on Labetalol  100 bid - Continue ldASA - Admission preE labs wnl, P/C ratio wnl  FWB - NST reactive - Growth today - in process, report pending.   Spanish speaking - Interpreter used throughout  - Antony Baumgartner  Continue routine antenatal care.   Lacey Pian, MD, FACOG Obstetrician & Gynecologist, [facility] for [facility], [facility]

## 2024-04-04 NOTE — Social Work (Signed)
 CSW notified [facility] RNCM regarding consult.

## 2024-04-04 NOTE — Care Management (Signed)
 Transition of Care [facility]) - Inpatient Brief Assessment   Patient Details  Name: Chloe Perez MRN: [id number] Date of Birth: [birth year]  Transition of Care [facility]) CM/SW Contact:    Ronni Colace, RN Phone Number: 04/04/2024, 1:15 PM   Clinical Narrative: Received consult regarding medication assistance. Patient is on insulin  and is getting it from [facility], however she is having trouble paying for it. Discussed with CSW SInclair. She will be calling [facility] for possible emergency Medicaid, but unsure if it would cover drugs as it would likely be family planning.   Could do a MATCH if needed, but medications would have to go to [facility] or [facility] versus [facility], who does not participate in the MATCH program.This would only be for a months worth.  Dr Vallarie Gauze aware.    Transition of Care Asessment: Insurance and Status: Selfpay Patient has primary care physician: Yes Home environment has been reviewed: SO single family Prior level of function:: Independent Prior/Current Home Services: No current home services

## 2024-04-04 NOTE — Inpatient Diabetes Management (Addendum)
 Inpatient Diabetes Program Recommendations  Diabetes Treatment Program Recommendations  ADA Standards of Care Diabetes in Pregnancy Target Glucose Ranges:  Fasting: 70 - 95 mg/dL 1 hr postprandial: Less than 140mg /dL (from first bite of meal) 2 hr postprandial: Less than 120 mg/dL (from first bite of meal)     Latest Reference Range & Units 04/03/24 14:15 04/03/24 23:20 04/04/24 06:01  Glucose-Capillary 70 - 99 mg/dL 409 (H) 811 (H) 914 (H)    Latest Reference Range & Units 10/24/23 16:59 12/19/23 12:13 04/03/24 19:00  Hemoglobin A1C 4.8 - 5.6 % 11.9 (H) 10.3 (H) 8.8 (H)   Review of Glycemic Control  Diabetes history: DM2 Outpatient Diabetes medications: NPH 14 units BID, Regular 16 units TID, Metfomrin XR 1000 mg BID Current orders for Inpatient glycemic control: NPH 22 units BID, Novolog  20 units TID with meals, Novolog  0-14 units TID, Metformin  XR 1000 mg   Inpatient Diabetes Program Recommendations:    Insulin : Please consider increasing NPH further to 25 units BID and meal coverage to 25 units TID with meals.   Addendum 04/04/24@11 :35-Spoke with patient at bedside along with Antony Baumgartner (on site interpreter).  Patient reports that she is getting insulin  at [facility] and taking Novolin  NPH 14 units BID, Novolin  Regular 16 units TID, and Metformin  XR 1000 mg BID.  Patient reports that her insulin  was $160 when she picked it up last time; she reports she prefers to use insulin  pens but they are more expensive than the vials. Patient reports that her glucose is running in the 200's and she is doing finger sticks for glucose monitoring. Discussed glucose goals during pregnancy and importance of glucose control. Patient would benefit from using a CGM during pregnancy to help improve DM control and provide more information for providers to use to make insulin  adjustments. Discussed FreeStyle Libre 3 CGM sensors and how CGM works. Patient is interested in using CGM and her iphone is compatible with the  FreeStyle Libre 3 app. Dr. Vallarie Gauze gave order for patient to be given FreeStyle Libre 3 sensor samples; provided with 4 sensors which should last throughout pregnancy. Educated patient on FreeStyle Libre3 CGM regarding application and changing CGM sensor (alternate every 14 days on back of arms), 1 hour warm-up, how to scan sensor to start a new sensor, and how to use app to check glucose. Provided educational packet (Spanish) regarding FreeStyle Libre3 CGM.  Patient plans was able to download the FreeStyle [place] 3 app and create an account.  Discussed that CGM glucose value can be off as much as 20% when compared to finger stick CBG. Informed patient that nursing staff will be asking her to let them know what her glucose is showing on the app so they can document in the chart but they would also still be doing finger stick glucose BID (once a shift). Informed patient that TOC has been consulted to see if they can assist with insulin .  Patient very appreciative of sensors and any assistance that can be provided.  Patient verbalized understanding of information and has no questions at this time. Jillian, RN at bedside and aware that patient has on the [employer] 3 sensor and to chart the CGM glucose values in the chart on the CGM Device Flowsheet. Sent chat message to Dr. Vallarie Gauze to see if NPH and Novolog  meal coverage can be increased further due to post prandial glucose 319 mg/dl.   Thanks, Beacher Limerick, RN, MSN, CDCES Diabetes Coordinator Inpatient Diabetes Program [phone] (Team Pager from 8am to  5pm)

## 2024-04-04 NOTE — Progress Notes (Signed)
 RN [facility] Elexius Minar went to give Chloe Perez 10pm dose of Labetalol . Pt informed RN that she already took her dose from home. RN educated patient that she should not take home meds while in hospital and that those medications would be provided here.

## 2024-04-05 DIAGNOSIS — O10913 Unspecified pre-existing hypertension complicating pregnancy, third trimester: Secondary | ICD-10-CM

## 2024-04-05 DIAGNOSIS — Z3A30 30 weeks gestation of pregnancy: Secondary | ICD-10-CM

## 2024-04-05 DIAGNOSIS — O24113 Pre-existing diabetes mellitus, type 2, in pregnancy, third trimester: Principal | ICD-10-CM

## 2024-04-05 DIAGNOSIS — O99213 Obesity complicating pregnancy, third trimester: Secondary | ICD-10-CM

## 2024-04-05 LAB — GLUCOSE, CAPILLARY
Glucose-Capillary: 163 mg/dL — ABNORMAL HIGH (ref 70–99)
Glucose-Capillary: 191 mg/dL — ABNORMAL HIGH (ref 70–99)

## 2024-04-05 MED ORDER — INSULIN NPH (HUMAN) (ISOPHANE) 100 UNIT/ML ~~LOC~~ SUSP
30.0000 [IU] | Freq: Two times a day (BID) | SUBCUTANEOUS | Status: DC
  Administered 2024-04-05 – 2024-04-06 (×2): 30 [IU] via SUBCUTANEOUS
  Filled 2024-04-05: qty 10

## 2024-04-05 MED ORDER — LABETALOL HCL 200 MG PO TABS
200.0000 mg | ORAL_TABLET | Freq: Two times a day (BID) | ORAL | Status: DC
  Administered 2024-04-05 – 2024-04-06 (×2): 200 mg via ORAL
  Filled 2024-04-05 (×2): qty 1

## 2024-04-05 NOTE — Progress Notes (Addendum)
 FACULTY PRACTICE ANTEPARTUM PROGRESS NOTE  Chloe Perez is a 48 y.o. [id number] at [redacted]w[redacted]d who is admitted for uncontrolled T2DM.  Estimated Date of Delivery: 06/09/24 Fetal presentation is unsure.  Length of Stay:  2 Days. Admitted 04/03/2024  Subjective: Patient reports normal fetal movement.  She denies uterine contractions, denies bleeding and leaking of fluid per vagina.  Vitals:  Blood pressure 134/69, pulse 89, temperature 98.1 F (36.7 C), temperature source Oral, resp. rate 18, last menstrual period 08/13/2023, SpO2 99%. Physical Examination: CONSTITUTIONAL: Well-developed, well-nourished female in no acute distress.  SKIN: Skin is warm and dry. No rash noted. Not diaphoretic. No erythema. No pallor. NEUROLGIC: Alert and oriented to person, place, and time. Normal reflexes, muscle tone coordination. No cranial nerve deficit noted. PSYCHIATRIC: Normal mood and affect. Normal behavior. Normal judgment and thought content. CARDIOVASCULAR: Normal heart rate noted RESPIRATORY: Effort normal, no problems with respiration noted MUSCULOSKELETAL: Normal range of motion. No edema and no tenderness. ABDOMEN: Soft, nondistended, gravid. CERVIX: deferred  Fetal monitoring: has not had NST yet today  Results for orders placed or performed during the hospital encounter of 04/03/24 (from the past 48 hours)  Protein / creatinine ratio, urine     Status: Abnormal   Collection Time: 04/03/24  4:46 PM  Result Value Ref Range   Creatinine, Urine 56 mg/dL   Total Protein, Urine 9 mg/dL    Comment: NO NORMAL RANGE ESTABLISHED FOR THIS TEST   Protein Creatinine Ratio 0.16 (H) 0.00 - 0.15 mg/mg[Cre]    Comment: Performed at [facility], [address] [place], [place] [address]  Urinalysis, Routine w reflex microscopic -Urine, Clean Catch     Status: Abnormal   Collection Time: 04/03/24  4:46 PM  Result Value Ref Range   Color, Urine YELLOW YELLOW   APPearance HAZY (A) CLEAR    Specific Gravity, Urine 1.021 1.005 - 1.030   pH 5.0 5.0 - 8.0   Glucose, UA >=500 (A) NEGATIVE mg/dL   Hgb urine dipstick NEGATIVE NEGATIVE   Bilirubin Urine NEGATIVE NEGATIVE   Ketones, ur 5 (A) NEGATIVE mg/dL   Protein, ur NEGATIVE NEGATIVE mg/dL   Nitrite NEGATIVE NEGATIVE   Leukocytes,Ua NEGATIVE NEGATIVE   RBC / HPF 0-5 0 - 5 RBC/hpf   WBC, UA 0-5 0 - 5 WBC/hpf   Bacteria, UA RARE (A) NONE SEEN   Squamous Epithelial / HPF 6-10 0 - 5 /HPF   Mucus PRESENT     Comment: Performed at [facility], [address] [place], [place] [address]  CBC     Status: None   Collection Time: 04/03/24  4:51 PM  Result Value Ref Range   WBC 8.5 4.0 - 10.5 K/uL   RBC 4.51 3.87 - 5.11 MIL/uL   Hemoglobin 12.9 12.0 - 15.0 g/dL   HCT 47.8 29.5 - 62.1 %   MCV 82.9 80.0 - 100.0 fL   MCH 28.6 26.0 - 34.0 pg   MCHC 34.5 30.0 - 36.0 g/dL   RDW 30.8 65.7 - 84.6 %   Platelets 239 150 - 400 K/uL   nRBC 0.0 0.0 - 0.2 %    Comment: Performed at [facility], [address], [place], [place] [address]  Comprehensive metabolic panel with GFR     Status: Abnormal   Collection Time: 04/03/24  4:51 PM  Result Value Ref Range   Sodium 130 (L) 135 - 145 mmol/L   Potassium 3.7 3.5 - 5.1 mmol/L  Chloride 101 98 - 111 mmol/L   CO2 17 (L) 22 - 32 mmol/L   Glucose, Bld 246 (H) 70 - 99 mg/dL    Comment: Glucose reference range applies only to samples taken after fasting for at least 8 hours.   BUN 11 6 - 20 mg/dL   Creatinine, Ser 1.61 0.44 - 1.00 mg/dL   Calcium  8.8 (L) 8.9 - 10.3 mg/dL   Total Protein 6.4 (L) 6.5 - 8.1 g/dL   Albumin 2.9 (L) 3.5 - 5.0 g/dL   AST 30 15 - 41 U/L   ALT 29 0 - 44 U/L   Alkaline Phosphatase 74 38 - 126 U/L   Total Bilirubin 0.4 0.0 - 1.2 mg/dL   GFR, Estimated >09 >60 mL/min    Comment: (NOTE) Calculated using the CKD-EPI Creatinine Equation (2021)    Anion gap 12 5 - 15    Comment: Performed at [facility], [address], [place], [place] [address]   Hemoglobin A1c     Status: Abnormal   Collection Time: 04/03/24  7:00 PM  Result Value Ref Range   Hgb A1c MFr Bld 8.8 (H) 4.8 - 5.6 %    Comment: (NOTE) Pre diabetes:          5.7%-6.4%  Diabetes:              >6.4%  Glycemic control for   <7.0% adults with diabetes    Mean Plasma Glucose 205.86 mg/dL    Comment: Performed at [facility], [address] [place], [place] [address]  Type and screen [facility]     Status: None   Collection Time: 04/03/24  7:12 PM  Result Value Ref Range   ABO/RH(D) B POS    Antibody Screen NEG    Sample Expiration      04/06/2024,2359 Performed at [facility], [address], [place], [place] [address]   Glucose, capillary     Status: Abnormal   Collection Time: 04/03/24 11:20 PM  Result Value Ref Range   Glucose-Capillary 248 (H) 70 - 99 mg/dL    Comment: Glucose reference range applies only to samples taken after fasting for at least 8 hours.  Glucose, capillary     Status: Abnormal   Collection Time: 04/04/24  6:01 AM  Result Value Ref Range   Glucose-Capillary 297 (H) 70 - 99 mg/dL    Comment: Glucose reference range applies only to samples taken after fasting for at least 8 hours.  Glucose, capillary     Status: Abnormal   Collection Time: 04/04/24 11:00 AM  Result Value Ref Range   Glucose-Capillary 319 (H) 70 - 99 mg/dL    Comment: Glucose reference range applies only to samples taken after fasting for at least 8 hours.  Glucose, capillary     Status: Abnormal   Collection Time: 04/04/24  3:19 PM  Result Value Ref Range   Glucose-Capillary 157 (H) 70 - 99 mg/dL    Comment: Glucose reference range applies only to samples taken after fasting for at least 8 hours.  Glucose, capillary     Status: Abnormal   Collection Time: 04/04/24  9:38 PM  Result Value Ref Range   Glucose-Capillary 225 (H) 70 - 99 mg/dL    Comment: Glucose reference range applies only to samples taken after fasting for at least 8  hours.  Glucose, capillary     Status: Abnormal   Collection Time: 04/05/24  9:51 AM  Result Value Ref Range   Glucose-Capillary 163 (H) 70 - 99 mg/dL    Comment: Glucose reference range applies only to samples taken after fasting for at least 8 hours.    I have reviewed the patient's current medications.  ASSESSMENT: Principal Problem:   Uncontrolled diabetes mellitus with hyperglycemia (HCC)   PLAN: T2DM, uncontrolled - Prior to admission, was on NPH 14, R16 - A1C was 8.8 (down from 10.3) - Current regimen is NPH 25, Novolog  25/25/25. She is also on MTF 1000 bid.  - Appreciate DM educator's rec's, will monitor CBGs today - saw dietician yesterday, appreciate recs - fasting 135 today   CHTN - BP well controlled on Labetalol  100 bid - Continue ldASA - Admission preE labs wnl, P/C ratio wnl   FWB - NST reactive - Growth today - 98th%tile     Continue routine antenatal care.   Larence Pleas, MD, [facility] Attending Center for [employer] (Faculty Practice)  04/05/2024 10:09 AM  Per DM coordinator recs, increased NPH to 30 units BID today.

## 2024-04-05 NOTE — Inpatient Diabetes Management (Signed)
 Inpatient Diabetes Program Recommendations  Diabetes Treatment Program Recommendations  ADA Standards of Care Diabetes in Pregnancy Target Glucose Ranges:  Fasting: 70 - 95 mg/dL 1 hr postprandial: Less than 140mg /dL (from first bite of meal) 2 hr postprandial: Less than 120 mg/dL (from first bite of meal)     Latest Reference Range & Units 04/03/24 23:20 04/04/24 06:01 04/04/24 11:00 04/04/24 15:19 04/04/24 21:38 04/05/24 09:51  Glucose-Capillary 70 - 99 mg/dL 161 (H) 096 (H) 045 (H) 157 (H) 225 (H) 163 (H)   Review of Glycemic Control  Diabetes history: DM2 Outpatient Diabetes medications: NPH 14 units BID, Regular 16 units TID, Metfomrin XR 1000 mg BID Current orders for Inpatient glycemic control: NPH 25 units BID, Novolog  25 units TID with meals, Novolog  0-14 units TID, Metformin  XR 1000 mg    Inpatient Diabetes Program Recommendations:     Insulin : Please consider increasing NPH further to 30 units BID.  NOTE: Noted consult for Diabetes Coordinator. Diabetes Coordinator is not on campus over the weekend but available by pager from 8am to 5pm for questions or concerns.   Thanks, Beacher Limerick, RN, MSN, CDCES Diabetes Coordinator Inpatient Diabetes Program [phone] (Team Pager from 8am to 5pm)

## 2024-04-06 LAB — GLUCOSE, CAPILLARY
Glucose-Capillary: 164 mg/dL — ABNORMAL HIGH (ref 70–99)
Glucose-Capillary: 205 mg/dL — ABNORMAL HIGH (ref 70–99)

## 2024-04-06 MED ORDER — LABETALOL HCL 100 MG PO TABS
100.0000 mg | ORAL_TABLET | Freq: Once | ORAL | Status: AC
  Administered 2024-04-06: 100 mg via ORAL
  Filled 2024-04-06: qty 1

## 2024-04-06 MED ORDER — LABETALOL HCL 200 MG PO TABS
300.0000 mg | ORAL_TABLET | Freq: Two times a day (BID) | ORAL | Status: DC
  Administered 2024-04-06 – 2024-04-07 (×2): 300 mg via ORAL
  Filled 2024-04-06 (×2): qty 1

## 2024-04-06 MED ORDER — INSULIN NPH (HUMAN) (ISOPHANE) 100 UNIT/ML ~~LOC~~ SUSP
34.0000 [IU] | Freq: Two times a day (BID) | SUBCUTANEOUS | Status: DC
  Administered 2024-04-06 – 2024-04-07 (×2): 34 [IU] via SUBCUTANEOUS

## 2024-04-06 NOTE — Progress Notes (Signed)
 FACULTY PRACTICE ANTEPARTUM PROGRESS NOTE  Chloe Perez is a 48 y.o. [id number] at [redacted]w[redacted]d who is admitted for uncontrolled T2DM.  Estimated Date of Delivery: 06/09/24 Fetal presentation is unsure.  Length of Stay:  3 Days. Admitted 04/03/2024  Subjective: Patient reports normal fetal movement.  She denies uterine contractions, denies bleeding and leaking of fluid per vagina.  Vitals:  Blood pressure (!) 140/75, pulse 89, temperature 97.9 F (36.6 C), temperature source Oral, resp. rate 20, last menstrual period 08/13/2023, SpO2 99%. Physical Examination: CONSTITUTIONAL: Well-developed, well-nourished female in no acute distress.  SKIN: Skin is warm and dry. No rash noted. Not diaphoretic. No erythema. No pallor. NEUROLGIC: Alert and oriented to person, place, and time. Normal reflexes, muscle tone coordination. No cranial nerve deficit noted. PSYCHIATRIC: Normal mood and affect. Normal behavior. Normal judgment and thought content. CARDIOVASCULAR: Normal heart rate noted RESPIRATORY: Effort normal, no problems with respiration noted MUSCULOSKELETAL: Normal range of motion. No edema and no tenderness. ABDOMEN: Soft, nontender, nondistended, gravid. CERVIX: deferred  Fetal monitoring: (NST yesterday, today not done yet) FHR: 150 bpm, Variability: moderate, Accelerations: Present, Decelerations: Absent  Uterine activity: no contractions per hour  Results for orders placed or performed during the hospital encounter of 04/03/24 (from the past 48 hours)  Glucose, capillary     Status: Abnormal   Collection Time: 04/04/24 11:00 AM  Result Value Ref Range   Glucose-Capillary 319 (H) 70 - 99 mg/dL    Comment: Glucose reference range applies only to samples taken after fasting for at least 8 hours.  Glucose, capillary     Status: Abnormal   Collection Time: 04/04/24  3:19 PM  Result Value Ref Range   Glucose-Capillary 157 (H) 70 - 99 mg/dL    Comment: Glucose reference range applies only  to samples taken after fasting for at least 8 hours.  Glucose, capillary     Status: Abnormal   Collection Time: 04/04/24  9:38 PM  Result Value Ref Range   Glucose-Capillary 225 (H) 70 - 99 mg/dL    Comment: Glucose reference range applies only to samples taken after fasting for at least 8 hours.  Glucose, capillary     Status: Abnormal   Collection Time: 04/05/24  9:51 AM  Result Value Ref Range   Glucose-Capillary 163 (H) 70 - 99 mg/dL    Comment: Glucose reference range applies only to samples taken after fasting for at least 8 hours.  Glucose, capillary     Status: Abnormal   Collection Time: 04/05/24  7:32 PM  Result Value Ref Range   Glucose-Capillary 191 (H) 70 - 99 mg/dL    Comment: Glucose reference range applies only to samples taken after fasting for at least 8 hours.  Glucose, capillary     Status: Abnormal   Collection Time: 04/06/24 10:03 AM  Result Value Ref Range   Glucose-Capillary 164 (H) 70 - 99 mg/dL    Comment: Glucose reference range applies only to samples taken after fasting for at least 8 hours.    I have reviewed the patient's current medications.  ASSESSMENT: Principal Problem:   Uncontrolled diabetes mellitus with hyperglycemia (HCC)   PLAN: T2DM, uncontrolled - Prior to admission, was on NPH 14, R16 - A1C was 8.8 (down from 10.3) - Current regimen is NPH 30 (increased yesterday) Novolog  25/25/25. She is also on MTF 1000 bid.  - Appreciate DM educator's rec's, will monitor CBGs today - fasting 120 today--will likely increase again per recs   CHTN - BP not  well controlled, increased labetalol  to 200 BID last night, will monitor today - Continue ldASA - Admission preE labs wnl, P/C ratio wnl   FWB - NST reactive - last growth - 98th%tile    Continue routine antenatal care.   Larence Pleas, MD, [facility] Attending [facility] [employer] (Faculty Practice)  04/06/2024 10:29 AM

## 2024-04-07 ENCOUNTER — Other Ambulatory Visit (HOSPITAL_COMMUNITY): Payer: Self-pay

## 2024-04-07 DIAGNOSIS — E1165 Type 2 diabetes mellitus with hyperglycemia: Secondary | ICD-10-CM

## 2024-04-07 LAB — GLUCOSE, CAPILLARY
Glucose-Capillary: 160 mg/dL — ABNORMAL HIGH (ref 70–99)
Glucose-Capillary: 161 mg/dL — ABNORMAL HIGH (ref 70–99)

## 2024-04-07 MED ORDER — ENOXAPARIN SODIUM 40 MG/0.4ML IJ SOSY
40.0000 mg | PREFILLED_SYRINGE | INTRAMUSCULAR | Status: DC
  Filled 2024-04-07: qty 0.4

## 2024-04-07 MED ORDER — INSULIN NPH (HUMAN) (ISOPHANE) 100 UNIT/ML ~~LOC~~ SUSP
38.0000 [IU] | Freq: Two times a day (BID) | SUBCUTANEOUS | Status: DC
  Administered 2024-04-07 – 2024-04-08 (×2): 38 [IU] via SUBCUTANEOUS

## 2024-04-07 MED ORDER — LABETALOL HCL 200 MG PO TABS
300.0000 mg | ORAL_TABLET | Freq: Three times a day (TID) | ORAL | Status: DC
  Administered 2024-04-07 – 2024-04-08 (×3): 300 mg via ORAL
  Filled 2024-04-07 (×3): qty 1

## 2024-04-07 MED ORDER — INSULIN ASPART 100 UNIT/ML IJ SOLN
28.0000 [IU] | Freq: Three times a day (TID) | INTRAMUSCULAR | Status: DC
  Administered 2024-04-07 – 2024-04-08 (×3): 28 [IU] via SUBCUTANEOUS

## 2024-04-07 NOTE — Progress Notes (Signed)
 Patient ID: Chloe Perez, female   DOB: Oct 21, 1976, 48 y.o.   MRN: [id number]  FACULTY PRACTICE ANTEPARTUM(COMPREHENSIVE) NOTE  Chloe Perez is a 48 y.o. [id number] at [redacted]w[redacted]d by best clinical estimate who is admitted for glycemic control.   Fetal presentation is cephalic. Length of Stay:  4  Days  ASSESSMENT: Principal Problem:   Uncontrolled diabetes mellitus with hyperglycemia (HCC) IUP at 31 weeks Chronic hypertension  PLAN: Increase labetalol  300 mg to 3 times daily with good blood pressure control Increase NPH to 38 units twice daily Increase NovoLog  to 28 units 3 times daily with meals Continue metformin  1000 mg twice daily Check right lower extremity Doppler Add Lovenox   Subjective: Feels well today.  The patient reports some increasing swelling in her right calf.  She has noticed the veins are poking out.  She denies specific pain. Patient reports the fetal movement as active. Patient reports uterine contraction  activity as none. Patient reports  vaginal bleeding as none. Patient describes fluid per vagina as None.  Vitals:  Blood pressure 110/73, pulse 99, temperature 97.9 F (36.6 C), temperature source Oral, resp. rate 17, last menstrual period 08/13/2023, SpO2 99%. Physical Examination:  General appearance - alert, well appearing, and in no distress Chest - normal effort Abdomen - gravid, non-tender Fundal Height:  size equals dates Extremities: Homans sign is negative, no sign of DVT  Membranes:intact  Fetal Monitoring:  Baseline: 150 bpm, Variability: Good {> 6 bpm), Accelerations: Reactive, and Decelerations: Absent  Labs:  Results for orders placed or performed during the hospital encounter of 04/03/24 (from the past 24 hours)  Glucose, capillary   Collection Time: 04/06/24  9:42 PM  Result Value Ref Range   Glucose-Capillary 205 (H) 70 - 99 mg/dL  Glucose, capillary   Collection Time: 04/07/24  7:22 AM  Result Value Ref Range    Glucose-Capillary 160 (H) 70 - 99 mg/dL    Imaging Studies:    US  MFM OB FOLLOW UP Result Date: 04/04/2024 ----------------------------------------------------------------------  OBSTETRICS REPORT                       (Signed Final 04/04/2024 04:39 pm) ---------------------------------------------------------------------- Patient Info  ID #:       [id number]                          D.O.B.:  Feb 19, 1976 (48 yrs)(F)  Name:       Chloe Perez-                  Visit Date: 04/04/2024 12:46 pm              ALVARADO ---------------------------------------------------------------------- Performed By  Attending:        Penney Bowling DO       Ref. Address:     [facility]  Performed By:     Lonny Robertson       [facility]  Referred By:      Lacey Pian  MD ---------------------------------------------------------------------- Orders  #  Description                           Code        Ordered By  1  US  MFM OB FOLLOW UP                   16109.60    Lacey Pian ----------------------------------------------------------------------  #  Order #                     Accession #                Episode #  1  [id number]                   [id number]                 [id number] ---------------------------------------------------------------------- Indications  Pre-existing diabetes, type 2, in pregnancy,   O24.113  third trimester  Hypertension - Chronic/Pre-                    O10.019  existing(Labetalol )  Advanced maternal age multigravida 43+,        O4.523  third trimester  Large for gestational age fetus affecting      O36.60X0  management of mother  [redacted] weeks gestation of pregnancy                Z3A.30 ---------------------------------------------------------------------- Fetal Evaluation  Num Of Fetuses:         1  Fetal Heart Rate(bpm):  145  Cardiac Activity:       Observed  Presentation:           Breech  Placenta:                Anterior  P. Cord Insertion:      Visualized, central  Amniotic Fluid  AFI FV:      Within normal limits  AFI Sum(cm)     %Tile       Largest Pocket(cm)  13.7            44          5.7                RLQ(cm)       LUQ(cm)        LLQ(cm)                5.7           3.4            4.6 ---------------------------------------------------------------------- Biometry  BPD:      78.7  mm     G. Age:  31w 4d         70  %    CI:        74.37   %    70 - 86                                                          FL/HC:      20.4   %    19.3 - 21.3  HC:      289.7  mm     G. Age:  31w 6d  52  %    HC/AC:      0.94        0.96 - 1.17  AC:      307.7  mm     G. Age:  34w 5d       > 99  %    FL/BPD:     75.2   %    71 - 87  FL:       59.2  mm     G. Age:  30w 6d         44  %    FL/AC:      19.2   %    20 - 24  Est. FW:    2110  gm    4 lb 10 oz      98  % ---------------------------------------------------------------------- OB History  Gravidity:    3         Term:   1         SAB:   1  Living:       1 ---------------------------------------------------------------------- Gestational Age  LMP:           33w 4d        Date:  08/13/23                  EDD:   05/19/24  U/S Today:     32w 2d                                        EDD:   05/28/24  Best:          30w 4d     Det. By:  Delphine Fiedler         EDD:   06/09/24                                      (12/04/23) ---------------------------------------------------------------------- Anatomy  Ventricles:            Appears normal         Stomach:                Appears normal, left                                                                        sided  Heart:                 Not well visualized    Kidneys:                Not well visualized  Diaphragm:             Appears normal         Bladder:                Appears normal ---------------------------------------------------------------------- Comments  Hospital Ultrasound  The patient is admitted for  diabetic management.  Sonographic findings  Single intrauterine pregnancy at 30w 4d  Fetal cardiac activity: Observed and appears normal.  Presentation: Breech.  Limited fetal anatomy appears normal.  EFW is at the 99th percentile.  Amniotic fluid volume: Within normal limits. MVP: 5.7 cm.  Placenta: Anterior.  Recommendations  - Continue clinical management per OB provider.  - Consider weekly US  to assess amniotic fluid  - Growth US  every 4 weeks  - Delivery likely no later than [redacted] weeks gestation but will  depend on the clinical course  This was a limited ultrasound with a remote read. If an official  MFM consult is requested for any reason please call/place an  order in Epic. ----------------------------------------------------------------------                 Penney Bowling, DO Electronically Signed Final Report   04/04/2024 04:39 pm ----------------------------------------------------------------------     Medications:  Scheduled  aspirin  EC  81 mg Oral Daily   docusate sodium   100 mg Oral Daily   insulin  aspart  0-14 Units Subcutaneous TID PC   insulin  aspart  25 Units Subcutaneous TID WC   insulin  NPH Human  34 Units Subcutaneous BID AC & HS   labetalol   300 mg Oral BID   metFORMIN   1,000 mg Oral BID WC   prenatal multivitamin  1 tablet Oral Q1200   I have reviewed the patient's current medications.   Granville Layer, MD 04/07/2024,11:49 AM

## 2024-04-07 NOTE — Inpatient Diabetes Management (Signed)
 Inpatient Diabetes Program Recommendations  ADA Standards of Care 2023 Diabetes in Pregnancy Target Glucose Ranges:  Fasting: 70 - 95 mg/dL 1 hr postprandial:  098 - 140mg /dL (from first bite of meal) 2 hr postprandial:  100 - 120 mg/dL (from first bit of meal)    Lab Results  Component Value Date   GLUCAP 160 (H) 04/07/2024   HGBA1C 8.8 (H) 04/03/2024    Review of Glycemic Control  Latest Reference Range & Units 04/05/24 09:51 04/05/24 19:32 04/06/24 10:03 04/06/24 21:42 04/07/24 07:22  Glucose-Capillary 70 - 99 mg/dL 119 (H) 147 (H) 829 (H) 205 (H) 160 (H)  (H): Data is abnormally high  Diabetes history: DM2 Outpatient Diabetes medications: NPH 14 units BID, Regular 16 units TID, Metfomrin XR 1000 mg BID Current orders for Inpatient glycemic control: NPH 34 units BID, Novolog  25 units TID with meals, Novolog  0-14 units TID, Metformin  XR 1000 mg   Inpatient Diabetes Program Recommendations:    Please consider:  NPH 38 units BID Novolog  28 units TID with meals if she consumes at least 50%  Will continue to follow while inpatient.  Thank you, Hays Lipschutz, MSN, CDCES Diabetes Coordinator Inpatient Diabetes Program [phone] (team pager from 8a-5p)

## 2024-04-08 ENCOUNTER — Inpatient Hospital Stay (HOSPITAL_COMMUNITY): Payer: MEDICAID

## 2024-04-08 ENCOUNTER — Other Ambulatory Visit (HOSPITAL_COMMUNITY): Payer: Self-pay

## 2024-04-08 ENCOUNTER — Other Ambulatory Visit: Payer: Self-pay

## 2024-04-08 DIAGNOSIS — M7989 Other specified soft tissue disorders: Secondary | ICD-10-CM

## 2024-04-08 LAB — GLUCOSE, CAPILLARY
Glucose-Capillary: 90 mg/dL (ref 70–99)
Glucose-Capillary: 166 mg/dL — ABNORMAL HIGH (ref 70–99)

## 2024-04-08 MED ORDER — LABETALOL HCL 300 MG PO TABS
300.0000 mg | ORAL_TABLET | Freq: Three times a day (TID) | ORAL | 3 refills | Status: DC
  Filled 2024-04-08 (×2): qty 120, 40d supply, fill #0

## 2024-04-08 MED ORDER — INSULIN ASPART 100 UNIT/ML IJ SOLN
28.0000 [IU] | Freq: Three times a day (TID) | INTRAMUSCULAR | 11 refills | Status: DC
  Filled 2024-04-08 – 2024-04-10 (×3): qty 10, 12d supply, fill #0

## 2024-04-08 MED ORDER — INSULIN NPH (HUMAN) (ISOPHANE) 100 UNIT/ML ~~LOC~~ SUSP
38.0000 [IU] | Freq: Two times a day (BID) | SUBCUTANEOUS | 11 refills | Status: DC
  Filled 2024-04-08 (×4): qty 10, 13d supply, fill #0
  Filled 2024-04-25: qty 10, 13d supply, fill #1

## 2024-04-08 NOTE — Progress Notes (Signed)
 Right lower extremity venous duplex has been completed. Preliminary results can be found in CV Proc through chart review.   04/08/24 3:32 PM Birda Buffy RVT

## 2024-04-08 NOTE — Discharge Summary (Signed)
 Antenatal Physician Discharge Summary  Patient ID: Chloe Perez MRN: [id number] DOB/AGE: 46/19/1977 47 y.o.  Admit date: 04/03/2024 Discharge date: 04/08/2024  Admission Diagnoses:Principal Problem:   Uncontrolled diabetes mellitus with hyperglycemia (HCC) Active Problems:   Advanced maternal age in multigravida   Chronic hypertension during pregnancy, antepartum    Discharge Diagnoses: Same  Prenatal Procedures: NST, ultrasound  Consults: None  Hospital Course:  Chloe Perez is a 48 y.o. [id number] with IUP at [redacted]w[redacted]d admitted for poorly controlled type II diabetes in pregnancy.  She was admitted with for glycemic and blood pressure control.  She was started on insulin  and met with the diabetes educator.  Her insulin  regimens were titrated up daily until we achieved improved control.  Her blood pressures were also creeping up and her labetalol  was adjusted up to 300 mg 3 times daily.  Her blood pressures are now in the 120s to 130s over 60s to 70s.  Her CBG showed a fasting of 90 this morning.  She was deemed stable for discharge to home with outpatient follow up.  Discharge Exam: Temp:  [97.7 F (36.5 C)-98.1 F (36.7 C)] 97.7 F (36.5 C) (05/13 1159) Pulse Rate:  [83-87] 83 (05/13 1159) Resp:  [15-17] 15 (05/13 1159) BP: (128-138)/(65-79) 137/65 (05/13 1159) SpO2:  [97 %-100 %] 99 % (05/13 1159) Physical Examination: CONSTITUTIONAL: Well-developed, well-nourished female in no acute distress.  HENT:  Normocephalic, atraumatic, External right and left ear normal.  EYES: Conjunctivae and EOM are normal. Pupils are equal, round, and reactive to light. No scleral icterus.  NECK: Normal range of motion, supple, no masses SKIN: Skin is warm and dry. No rash noted. Not diaphoretic. No erythema. No pallor. NEUROLOGIC: Alert and oriented to person, place, and time. Normal reflexes, muscle tone coordination. No cranial nerve deficit noted. PSYCHIATRIC: Normal mood and  affect. Normal behavior. Normal judgment and thought content. CARDIOVASCULAR: Normal heart rate noted, regular rhythm RESPIRATORY: Effort and breath sounds normal, no problems with respiration noted MUSCULOSKELETAL: Normal range of motion. No edema and no tenderness. 2+ distal pulses. ABDOMEN: Soft, nontender, nondistended, gravid. CERVIX:    Fetal monitoring: FHR: 145 bpm, Variability: moderate, Accelerations: Present, Decelerations: Absent  Uterine activity: quiet   Significant Diagnostic Studies:  Results for orders placed or performed during the hospital encounter of 04/03/24 (from the past week)  Protein / creatinine ratio, urine   Collection Time: 04/03/24  4:46 PM  Result Value Ref Range   Creatinine, Urine 56 mg/dL   Total Protein, Urine 9 mg/dL   Protein Creatinine Ratio 0.16 (H) 0.00 - 0.15 mg/mg[Cre]  Urinalysis, Routine w reflex microscopic -Urine, Clean Catch   Collection Time: 04/03/24  4:46 PM  Result Value Ref Range   Color, Urine YELLOW YELLOW   APPearance HAZY (A) CLEAR   Specific Gravity, Urine 1.021 1.005 - 1.030   pH 5.0 5.0 - 8.0   Glucose, UA >=500 (A) NEGATIVE mg/dL   Hgb urine dipstick NEGATIVE NEGATIVE   Bilirubin Urine NEGATIVE NEGATIVE   Ketones, ur 5 (A) NEGATIVE mg/dL   Protein, ur NEGATIVE NEGATIVE mg/dL   Nitrite NEGATIVE NEGATIVE   Leukocytes,Ua NEGATIVE NEGATIVE   RBC / HPF 0-5 0 - 5 RBC/hpf   WBC, UA 0-5 0 - 5 WBC/hpf   Bacteria, UA RARE (A) NONE SEEN   Squamous Epithelial / HPF 6-10 0 - 5 /HPF   Mucus PRESENT   CBC   Collection Time: 04/03/24  4:51 PM  Result Value Ref Range   WBC 8.5  4.0 - 10.5 K/uL   RBC 4.51 3.87 - 5.11 MIL/uL   Hemoglobin 12.9 12.0 - 15.0 g/dL   HCT 16.1 09.6 - 04.5 %   MCV 82.9 80.0 - 100.0 fL   MCH 28.6 26.0 - 34.0 pg   MCHC 34.5 30.0 - 36.0 g/dL   RDW 40.9 81.1 - 91.4 %   Platelets 239 150 - 400 K/uL   nRBC 0.0 0.0 - 0.2 %  Comprehensive metabolic panel with GFR   Collection Time: 04/03/24  4:51 PM  Result  Value Ref Range   Sodium 130 (L) 135 - 145 mmol/L   Potassium 3.7 3.5 - 5.1 mmol/L   Chloride 101 98 - 111 mmol/L   CO2 17 (L) 22 - 32 mmol/L   Glucose, Bld 246 (H) 70 - 99 mg/dL   BUN 11 6 - 20 mg/dL   Creatinine, Ser 7.82 0.44 - 1.00 mg/dL   Calcium  8.8 (L) 8.9 - 10.3 mg/dL   Total Protein 6.4 (L) 6.5 - 8.1 g/dL   Albumin 2.9 (L) 3.5 - 5.0 g/dL   AST 30 15 - 41 U/L   ALT 29 0 - 44 U/L   Alkaline Phosphatase 74 38 - 126 U/L   Total Bilirubin 0.4 0.0 - 1.2 mg/dL   GFR, Estimated >95 >62 mL/min   Anion gap 12 5 - 15  Hemoglobin A1c   Collection Time: 04/03/24  7:00 PM  Result Value Ref Range   Hgb A1c MFr Bld 8.8 (H) 4.8 - 5.6 %   Mean Plasma Glucose 205.86 mg/dL  Type and screen [facility]   Collection Time: 04/03/24  7:12 PM  Result Value Ref Range   ABO/RH(D) B POS    Antibody Screen NEG    Sample Expiration      04/06/2024,2359 Performed at [facility], [address] [place], [place] [address]   Glucose, capillary   Collection Time: 04/03/24 11:20 PM  Result Value Ref Range   Glucose-Capillary 248 (H) 70 - 99 mg/dL  Glucose, capillary   Collection Time: 04/04/24  6:01 AM  Result Value Ref Range   Glucose-Capillary 297 (H) 70 - 99 mg/dL  Glucose, capillary   Collection Time: 04/04/24 11:00 AM  Result Value Ref Range   Glucose-Capillary 319 (H) 70 - 99 mg/dL  Glucose, capillary   Collection Time: 04/04/24  3:19 PM  Result Value Ref Range   Glucose-Capillary 157 (H) 70 - 99 mg/dL  Glucose, capillary   Collection Time: 04/04/24  9:38 PM  Result Value Ref Range   Glucose-Capillary 225 (H) 70 - 99 mg/dL  Glucose, capillary   Collection Time: 04/05/24  9:51 AM  Result Value Ref Range   Glucose-Capillary 163 (H) 70 - 99 mg/dL  Glucose, capillary   Collection Time: 04/05/24  7:32 PM  Result Value Ref Range   Glucose-Capillary 191 (H) 70 - 99 mg/dL  Glucose, capillary   Collection Time: 04/06/24 10:03 AM  Result Value Ref Range    Glucose-Capillary 164 (H) 70 - 99 mg/dL  Glucose, capillary   Collection Time: 04/06/24  9:42 PM  Result Value Ref Range   Glucose-Capillary 205 (H) 70 - 99 mg/dL  Glucose, capillary   Collection Time: 04/07/24  7:22 AM  Result Value Ref Range   Glucose-Capillary 160 (H) 70 - 99 mg/dL  Glucose, capillary   Collection Time: 04/07/24 10:14 PM  Result Value Ref Range   Glucose-Capillary 161 (H) 70 - 99 mg/dL  Glucose,  capillary   Collection Time: 04/08/24  4:17 AM  Result Value Ref Range   Glucose-Capillary 90 70 - 99 mg/dL  Glucose, capillary   Collection Time: 04/08/24 10:01 AM  Result Value Ref Range   Glucose-Capillary 166 (H) 70 - 99 mg/dL  Results for orders placed or performed in visit on 04/03/24 (from the past week)  Glucose, capillary   Collection Time: 04/03/24  2:15 PM  Result Value Ref Range   Glucose-Capillary 262 (H) 70 - 99 mg/dL   US  MFM OB FOLLOW UP Result Date: 04/04/2024 ----------------------------------------------------------------------  OBSTETRICS REPORT                       (Signed Final 04/04/2024 04:39 pm) ---------------------------------------------------------------------- Patient Info  ID #:       [id number]                          D.O.B.:  1976-04-10 (48 yrs)(F)  Name:       [name]-                  Visit Date: 04/04/2024 12:46 pm              ALVARADO ---------------------------------------------------------------------- Performed By  Attending:        Penney Bowling DO       Ref. Address:     [facility]  Performed By:     Lonny Robertson       [facility]  Referred By:      Lacey Pian                    MD ---------------------------------------------------------------------- Orders  #  Description                           Code        Ordered By  1  US  MFM OB FOLLOW UP                   40981.19    Lacey Pian  ----------------------------------------------------------------------  #  Order #                     Accession #                Episode #  1  [id number]                   [id number]                 [id number] ---------------------------------------------------------------------- Indications  Pre-existing diabetes, type 2, in pregnancy,   O24.113  third trimester  Hypertension - Chronic/Pre-                    O10.019  existing(Labetalol )  Advanced maternal age multigravida 23+,        O29.523  third trimester  Large for gestational age fetus affecting      O36.60X0  management of mother  [redacted] weeks gestation of pregnancy  Z3A.30 ---------------------------------------------------------------------- Fetal Evaluation  Num Of Fetuses:         1  Fetal Heart Rate(bpm):  145  Cardiac Activity:       Observed  Presentation:           Breech  Placenta:               Anterior  P. Cord Insertion:      Visualized, central  Amniotic Fluid  AFI FV:      Within normal limits  AFI Sum(cm)     %Tile       Largest Pocket(cm)  13.7            44          5.7                RLQ(cm)       LUQ(cm)        LLQ(cm)                5.7           3.4            4.6 ---------------------------------------------------------------------- Biometry  BPD:      78.7  mm     G. Age:  31w 4d         70  %    CI:        74.37   %    70 - 86                                                          FL/HC:      20.4   %    19.3 - 21.3  HC:      289.7  mm     G. Age:  31w 6d         52  %    HC/AC:      0.94        0.96 - 1.17  AC:      307.7  mm     G. Age:  34w 5d       > 99  %    FL/BPD:     75.2   %    71 - 87  FL:       59.2  mm     G. Age:  30w 6d         44  %    FL/AC:      19.2   %    20 - 24  Est. FW:    2110  gm    4 lb 10 oz      98  % ---------------------------------------------------------------------- OB History  Gravidity:    3         Term:   1         SAB:   1  Living:       1  ---------------------------------------------------------------------- Gestational Age  LMP:           33w 4d        Date:  08/13/23                  EDD:   05/19/24  U/S Today:     32w 2d  EDD:   05/28/24  Best:          30w 4d     Det. By:  Delphine Fiedler         EDD:   06/09/24                                      (12/04/23) ---------------------------------------------------------------------- Anatomy  Ventricles:            Appears normal         Stomach:                Appears normal, left                                                                        sided  Heart:                 Not well visualized    Kidneys:                Not well visualized  Diaphragm:             Appears normal         Bladder:                Appears normal ---------------------------------------------------------------------- Comments  Hospital Ultrasound  The patient is admitted for diabetic management.  Sonographic findings  Single intrauterine pregnancy at 30w 4d  Fetal cardiac activity: Observed and appears normal.  Presentation: Breech.  Limited fetal anatomy appears normal.  EFW is at the 99th percentile.  Amniotic fluid volume: Within normal limits. MVP: 5.7 cm.  Placenta: Anterior.  Recommendations  - Continue clinical management per OB provider.  - Consider weekly US  to assess amniotic fluid  - Growth US  every 4 weeks  - Delivery likely no later than [redacted] weeks gestation but will  depend on the clinical course  This was a limited ultrasound with a remote read. If an official  MFM consult is requested for any reason please call/place an  order in Epic. ----------------------------------------------------------------------                 Penney Bowling, DO Electronically Signed Final Report   04/04/2024 04:39 pm ----------------------------------------------------------------------    Future Appointments  Date Time Provider Department Center  04/18/2024  8:00 AM [facility] PROVIDER 1  [facility]  04/18/2024  8:30 AM [facility] US4 [facility]  04/23/2024  1:15 PM Arleene Belt [facility]  05/01/2024  4:00 PM Ronalee Cocking, MD [facility] None    Discharge Condition: Stable  Discharge disposition: 01-Home or Self Care       Discharge Instructions     Discharge activity:  Bathroom / Shower only   Complete by: As directed    Discharge activity: Bedrest   Complete by: As directed    Discharge diet:   Complete by: As directed    OB diabetes diet   Fetal Kick Count:  Lie on our left side for one hour after a meal, and count the number of times your baby kicks.  If it is less than 5 times, get up, move around and drink some juice.  Repeat the test 30 minutes later.  If it is still less than 5 kicks in an hour, notify your doctor.   Complete by: As directed       Allergies as of 04/08/2024   No Known Allergies      Medication List     TAKE these medications    Alcohol  Pads 70 % Pads 1 Pad by Does not apply route 4 (four) times daily.   aspirin  EC 81 MG tablet Take 1 tablet (81 mg total) by mouth daily. Swallow whole.   insulin  aspart 100 UNIT/ML injection Commonly known as: novoLOG  Inject 28 Units into the skin 3 (three) times daily with meals.   insulin  NPH Human 100 UNIT/ML injection Commonly known as: NOVOLIN  N Inject 0.38 mLs (38 Units total) into the skin 2 (two) times daily at 8 am and 10 pm.   Insulin  Pen Needle 31G X 6 MM Misc 1 Units by Does not apply route 4 (four) times daily.   labetalol  300 MG tablet Commonly known as: NORMODYNE  Take 1 tablet (300 mg total) by mouth 3 (three) times daily.   metFORMIN  500 MG 24 hr tablet Commonly known as: GLUCOPHAGE -XR 2 tabs by mouth twice daily.   nitroGLYCERIN  0.4 MG SL tablet Commonly known as: Nitrostat  1 tab under tongue as needed for chest pain may repeat every 5 minutes times 2 if pain not relieved   Omron 3 Series BP Monitor Devi Use to take blood pressure as needed    prenatal vitamin w/FE, FA 27-1 MG Tabs tablet Tome 1 tableta por va oral diariamente. (Take 1 tablet by mouth daily.)        Follow-up Information     [facility] Follow up.   Specialty: Obstetrics and Gynecology Why: keep next scheduled appointment Contact information: [address] [place]  [address] [phone]                Total discharge time: 30 minutes   Signed: Granville Layer M.D. 04/08/2024, 3:42 PM

## 2024-04-09 ENCOUNTER — Other Ambulatory Visit: Payer: Self-pay

## 2024-04-10 ENCOUNTER — Other Ambulatory Visit (HOSPITAL_COMMUNITY): Payer: Self-pay

## 2024-04-10 ENCOUNTER — Other Ambulatory Visit: Payer: Self-pay

## 2024-04-10 MED ORDER — INSULIN LISPRO 100 UNIT/ML IJ SOLN
28.0000 [IU] | Freq: Three times a day (TID) | INTRAMUSCULAR | 11 refills | Status: DC
  Filled 2024-04-10: qty 10, 12d supply, fill #0
  Filled 2024-04-25: qty 10, 12d supply, fill #1

## 2024-04-10 MED ORDER — INSULIN SYRINGE-NEEDLE U-100 30G X 1/2" 0.3 ML MISC
0 refills | Status: DC
  Filled 2024-04-10: qty 100, 30d supply, fill #0

## 2024-04-12 ENCOUNTER — Other Ambulatory Visit: Payer: Self-pay

## 2024-04-12 ENCOUNTER — Inpatient Hospital Stay (HOSPITAL_COMMUNITY)
Admission: AD | Admit: 2024-04-12 | Discharge: 2024-04-12 | Disposition: A | Discharge status: Home / Self Care | Payer: MEDICAID | Attending: Obstetrics and Gynecology | Admitting: Obstetrics and Gynecology

## 2024-04-12 ENCOUNTER — Encounter (HOSPITAL_COMMUNITY): Payer: Self-pay | Admitting: Obstetrics and Gynecology

## 2024-04-12 DIAGNOSIS — Z3A31 31 weeks gestation of pregnancy: Secondary | ICD-10-CM

## 2024-04-12 DIAGNOSIS — Z3689 Encounter for other specified antenatal screening: Secondary | ICD-10-CM | POA: Insufficient documentation

## 2024-04-12 DIAGNOSIS — O368130 Decreased fetal movements, third trimester, not applicable or unspecified: Secondary | ICD-10-CM

## 2024-04-12 NOTE — Progress Notes (Signed)
 Pt checked her glucose using her deskcom and got 127 on her phone

## 2024-04-12 NOTE — MAU Provider Note (Signed)
 History     CSN: [id number]  Arrival date and time: 04/12/24 2213   Event Date/Time   First Provider Initiated Contact with Patient 04/12/24 2306      Chief Complaint  Patient presents with   Decreased Fetal Movement   Chloe Perez is a 48 y.o. [id number] at [redacted]w[redacted]d who receives care at [facility].  She presents today for DFM.  She reports she hadn't really paid attention, but thinks it may have been ~ 4 hours.  She states she ate cake, showered, and laid on her left side to try to elicit movement. She states she was told that if the baby does not move 5x per hour she should come in.  Patient denies vaginal leaking, bleeding, or discharge.  She reports occasional cramping, but no contractions. She endorses fetal movement since arrival.   OB History     Gravida  3   Para  1   Term      Preterm  1   AB  1   Living  1      SAB  1   IAB      Ectopic      Multiple      Live Births  1           Past Medical History:  Diagnosis Date   Diabetes mellitus without complication (HCC)    DM type 2 metformin     Female hypogonadism 02/08/2017   Heart pain    Hyperlipidemia associated with type 2 diabetes mellitus (HCC) 10/24/2023   Missed AB    PCOS (polycystic ovarian syndrome) 02/08/2017    Past Surgical History:  Procedure Laterality Date   CESAREAN SECTION N/A 05/28/2018   Procedure: CESAREAN SECTION;  Surgeon: Malka Sea, DO;  Location: [facility];  Service: Obstetrics;  Laterality: N/A;   Uterine polyps     Had two different surgeris to remove--last 7 years ago.    Family History  Problem Relation Age of Onset   Hypertension Mother    Diabetes Mother    Heart disease Father     Social History   Tobacco Use   Smoking status: Former    Current packs/day: 0.00    Types: Cigarettes    Quit date: 09/28/2023    Years since quitting: 0.5   Smokeless tobacco: Never   Tobacco comments:    Discussed nicotine gum and how to use.   Vaping Use   Vaping status: Never Used  Substance Use Topics   Alcohol  use: Not Currently    Comment: sometimes   Drug use: No    Allergies: No Known Allergies  Medications Prior to Admission  Medication Sig Dispense Refill Last Dose/Taking   Alcohol  Swabs (ALCOHOL  PADS) 70 % PADS 1 Pad by Does not apply route 4 (four) times daily. 100 each 2    aspirin  EC 81 MG tablet Take 1 tablet (81 mg total) by mouth daily. Swallow whole. 60 tablet 1    Blood Pressure Monitoring (OMRON 3 SERIES BP MONITOR) DEVI Use to take blood pressure as needed 1 each 0    insulin  lispro (HUMALOG ) 100 UNIT/ML injection Inject 0.28 mLs (28 Units total) into the skin 3 (three) times daily with meals. 10 mL 11    insulin  NPH Human (NOVOLIN  N) 100 UNIT/ML injection Inject 0.38 mLs (38 Units total) into the skin 2 (two) times daily at 8 am and 10 pm. 10 mL 11    Insulin  Pen Needle 31G  X 6 MM MISC 1 Units by Does not apply route 4 (four) times daily. 100 each 6    Insulin  Syringe-Needle U-100 30G X 1/2" 0.3 ML MISC Use 1 syringe to inject insulin  three times a day. 100 each 0    labetalol  (NORMODYNE ) 300 MG tablet Take 1 tablet (300 mg total) by mouth 3 (three) times daily. 120 tablet 3    metFORMIN  (GLUCOPHAGE -XR) 500 MG 24 hr tablet 2 tabs by mouth twice daily. 120 tablet 11    nitroGLYCERIN  (NITROSTAT ) 0.4 MG SL tablet 1 tab under tongue as needed for chest pain may repeat every 5 minutes times 2 if pain not relieved (Patient not taking: Reported on 04/03/2024) 25 tablet 1    prenatal vitamin w/FE, FA (PRENATAL 1 + 1) 27-1 MG TABS tablet Take 1 tablet by mouth daily. 30 tablet 11     Review of Systems  Gastrointestinal:  Negative for nausea and vomiting.  Genitourinary:  Negative for difficulty urinating, dysuria, vaginal bleeding and vaginal discharge.   Physical Exam   Blood pressure (!) 148/68, pulse 78, temperature 97.7 F (36.5 C), temperature source Oral, resp. rate 18, last menstrual period  08/13/2023.  Physical Exam Vitals and nursing note reviewed.  Constitutional:      Appearance: Normal appearance.  HENT:     Head: Normocephalic and atraumatic.  Eyes:     Conjunctiva/sclera: Conjunctivae normal.  Cardiovascular:     Rate and Rhythm: Normal rate.  Pulmonary:     Effort: Pulmonary effort is normal. No respiratory distress.  Musculoskeletal:     Cervical back: Normal range of motion.  Neurological:     Mental Status: She is alert and oriented to person, place, and time.  Psychiatric:        Mood and Affect: Mood normal.        Behavior: Behavior normal.     Fetal Assessment 140 bpm, Mod Var, -Decels, +Accels Toco: No ctx, Some irritability noted  MAU Course  No results found for this or any previous visit (from the past 24 hours). No results found.  MDM PE Labs: None EFM  Assessment and Plan  48 year old G3P0111  SIUP at 31.5 weeks Cat I FT DFM  -NST reactive -Reassured that she can come in anytime when concerned with fetal movement.  -Precautions reviewed. -Keep next appt as scheduled.  -Encouraged to call primary office or return to MAU if symptoms worsen or with the onset of new symptoms. -Discharged to home in stable condition.  Kraig Peru MSN, CNM 04/12/2024, 11:07 PM

## 2024-04-12 NOTE — Progress Notes (Addendum)
 Pt states she was told in the clinic to lay on her side and baby has to move 5 times in an hour. She came in because baby did not move 5 times in an hour. Denies bleeding or leaking of fluid. Pt also c/o of nipple pain 9/10.

## 2024-04-18 ENCOUNTER — Other Ambulatory Visit: Payer: Self-pay | Admitting: *Deleted

## 2024-04-18 ENCOUNTER — Ambulatory Visit: Payer: Self-pay | Attending: Obstetrics and Gynecology

## 2024-04-18 ENCOUNTER — Ambulatory Visit (HOSPITAL_BASED_OUTPATIENT_CLINIC_OR_DEPARTMENT_OTHER): Payer: Self-pay | Admitting: Obstetrics and Gynecology

## 2024-04-18 VITALS — BP 127/71 | HR 79

## 2024-04-18 DIAGNOSIS — Z3A32 32 weeks gestation of pregnancy: Secondary | ICD-10-CM | POA: Insufficient documentation

## 2024-04-18 DIAGNOSIS — O10913 Unspecified pre-existing hypertension complicating pregnancy, third trimester: Secondary | ICD-10-CM | POA: Insufficient documentation

## 2024-04-18 DIAGNOSIS — O24113 Pre-existing diabetes mellitus, type 2, in pregnancy, third trimester: Secondary | ICD-10-CM

## 2024-04-18 DIAGNOSIS — O09523 Supervision of elderly multigravida, third trimester: Secondary | ICD-10-CM

## 2024-04-18 DIAGNOSIS — Z794 Long term (current) use of insulin: Secondary | ICD-10-CM

## 2024-04-18 DIAGNOSIS — O0990 Supervision of high risk pregnancy, unspecified, unspecified trimester: Secondary | ICD-10-CM | POA: Insufficient documentation

## 2024-04-18 DIAGNOSIS — O24112 Pre-existing diabetes mellitus, type 2, in pregnancy, second trimester: Secondary | ICD-10-CM | POA: Insufficient documentation

## 2024-04-18 DIAGNOSIS — O3660X0 Maternal care for excessive fetal growth, unspecified trimester, not applicable or unspecified: Secondary | ICD-10-CM

## 2024-04-18 DIAGNOSIS — O10013 Pre-existing essential hypertension complicating pregnancy, third trimester: Secondary | ICD-10-CM

## 2024-04-18 DIAGNOSIS — E119 Type 2 diabetes mellitus without complications: Secondary | ICD-10-CM

## 2024-04-18 DIAGNOSIS — O09522 Supervision of elderly multigravida, second trimester: Secondary | ICD-10-CM | POA: Insufficient documentation

## 2024-04-18 DIAGNOSIS — O3663X0 Maternal care for excessive fetal growth, third trimester, not applicable or unspecified: Secondary | ICD-10-CM

## 2024-04-18 NOTE — Progress Notes (Signed)
  Maternal-Fetal Medicine Consultation Name: Chloe Perez MRN: [id number]  G2 P0101 at 32w 4d gestation.  - Type 2 diabetes.  Patient takes insulin  NPH 38 units in the morning and 38 units at night.  She takes NovoLog  28/28/28 units with meals.  She takes metformin  500 mg twice daily.  She reports her fasting levels are between 84 and 98 mg/dL, and postprandial levels are between 110 and 131 mg/dL. Patient was admitted 2 weeks ago for control of diabetes and was discharged after 5 days of inpatient management.  Recent hemoglobin A1c was 8.8%. - Chronic hypertension.  Patient takes labetalol  and her blood pressures are well-controlled.  Blood pressure today at our office is 127/71 mmHg.  Ultrasound The estimated fetal weight and the abdominal circumference measurements are at the 99th percentiles.  Amniotic fluid is normal good fetal activity seen.  Cephalic presentation.  Antenatal testing is reassuring.  BPP 8/8. No evidence of placenta previa or placenta accreta spectrum.  I discussed the finding of large for gestational age fetus that reflects previous poor control.  I emphasized the importance of good blood glucose control to prevent fetal and neonatal adverse outcomes.  Ultrasound has limitations in accurately estimating fetal weights. I discussed ultrasound protocol of weekly antenatal testing. Given that she has comorbid conditions of chronic hypertension and diabetes that required inpatient management, delivery at [redacted] weeks gestation is reasonable. Patient is undecided about mode of delivery.  Recommendations - Continue weekly antenatal testing till delivery. - Fetal growth assessment in 4 weeks. - Consider delivery at [redacted] weeks gestation.  Consultation including face-to-face (more than 50%) counseling 20 minutes.

## 2024-04-23 ENCOUNTER — Ambulatory Visit: Payer: Self-pay | Admitting: Physician Assistant

## 2024-04-23 ENCOUNTER — Other Ambulatory Visit: Payer: Self-pay

## 2024-04-23 VITALS — BP 146/77 | HR 86 | Wt 173.1 lb

## 2024-04-23 DIAGNOSIS — Z603 Acculturation difficulty: Secondary | ICD-10-CM

## 2024-04-23 DIAGNOSIS — O0990 Supervision of high risk pregnancy, unspecified, unspecified trimester: Secondary | ICD-10-CM

## 2024-04-23 DIAGNOSIS — Z758 Other problems related to medical facilities and other health care: Secondary | ICD-10-CM

## 2024-04-23 DIAGNOSIS — O0993 Supervision of high risk pregnancy, unspecified, third trimester: Secondary | ICD-10-CM

## 2024-04-23 DIAGNOSIS — Z3A33 33 weeks gestation of pregnancy: Secondary | ICD-10-CM

## 2024-04-23 DIAGNOSIS — Z98891 History of uterine scar from previous surgery: Secondary | ICD-10-CM

## 2024-04-23 DIAGNOSIS — O24113 Pre-existing diabetes mellitus, type 2, in pregnancy, third trimester: Secondary | ICD-10-CM

## 2024-04-23 DIAGNOSIS — O10919 Unspecified pre-existing hypertension complicating pregnancy, unspecified trimester: Secondary | ICD-10-CM

## 2024-04-23 MED ORDER — FREESTYLE LIBRE 3 SENSOR MISC
1.0000 | 2 refills | Status: DC
  Filled 2024-04-23: qty 2, fill #0
  Filled 2024-04-25: qty 2, 28d supply, fill #0

## 2024-04-23 NOTE — Progress Notes (Signed)
 PRENATAL VISIT NOTE  Subjective:  Anushree Dorsi is a 48 y.o. [phone] at [redacted]w[redacted]d being seen today for ongoing prenatal care.  She is currently monitored for the following issues for this high-risk pregnancy and has Type II diabetes mellitus (HCC); Pre-existing type 2 diabetes mellitus in pregnancy; Non-English speaking patient; Advanced maternal age in multigravida; History of cesarean delivery; Heart murmur, systolic; Obesity (BMI 30.0-34.9); History of chest pain; Supervision of high risk pregnancy, antepartum; Poor compliance; Chronic hypertension during pregnancy, antepartum; and Uncontrolled diabetes mellitus with hyperglycemia (HCC) on their problem list.  Patient requests freestyle Libre CBG monitor and insulin  vials instead of insulin  pens due to cost effectiveness.   Contractions: Irritability. Vag. Bleeding: None.  Movement: Present. Denies leaking of fluid.   The following portions of the patient's history were reviewed and updated as appropriate: allergies, current medications, past family history, past medical history, past social history, past surgical history and problem list.   Objective:    Vitals:   04/23/24 1332  BP: (!) 146/77  Pulse: 86  Weight: 173 lb 1.6 oz (78.5 kg)    Fetal Status:      Movement: Present    General: Alert, oriented and cooperative. Patient is in no acute distress.  Skin: Skin is warm and dry. No rash noted.   Cardiovascular: Normal heart rate noted  Respiratory: Normal respiratory effort, no problems with respiration noted  Abdomen: Soft, gravid, appropriate for gestational age.  Pain/Pressure: Present     Pelvic: Cervical exam deferred        Extremities: Normal range of motion.  Edema: Mild pitting, slight indentation  Mental Status: Normal mood and affect. Normal behavior. Normal judgment and thought content.   Assessment and Plan:  Pregnancy: [id number] at [redacted]w[redacted]d  1. Supervision of high risk pregnancy, antepartum  (Primary) Patient doing well, feeling regular fetal movement BP, FHR, FH appropriate  2. [redacted] weeks gestation of pregnancy Anticipatory guidance about next visits/weeks of pregnancy given.   3. Chronic hypertension during pregnancy, antepartum Labetalol  300 mg TID BP 146/77, did not take antihypertensive today, reports no barriers to adherence.  Normal baseline labs Continue ASA I reviewed reviewed antihypertensive regimen with patient and discussed maternal and fetal risks of uncontrolled HTN. We reviewed ssxs preE and had a detailed discussion and demonstration re: how to use BP cuff for home monitoring.   4. Pre-existing type 2 diabetes mellitus during pregnancy in third trimester Insulin  lispro/humalog  - 28/28/28 Insulin  NPH - 38 units BID Metformin  1000 mg BID Unable to access BG from [facility] provider platform, but patient has log today, available in EMR.  FBS: 84-98 with >80% WNL PPBS: 110-140 with <80% WNL for each meal period; most elevated readings marginally so.  We reviewed insulin  regimen together and patient repeated it back to me accurately with assistance of interpreter. Discussed symptoms of low blood sugar including sweating, blurred vision, and confusion; importance of keeping quick-sugar foods with her at all times. -Will have pt return in 1 week for closer monitoring.  -Requests Libre CBG and injectables by vial over pens d/t cost effectiveness. She is aware that this requires different mobile platform for monitoring.  -Weekly antenatal testing until delivery -Continued fetal growth assessment  -Delivery at [redacted] weeks gestation per MFM  5. History of cesarean delivery G2, 2019, preterm at 33w  6. Language barrier Spanish interpreter utilized during encounter  Preterm labor symptoms and general obstetric precautions including but not limited to vaginal bleeding, contractions, leaking of fluid  and fetal movement were reviewed in detail with the patient.  Please refer to  After Visit Summary for other counseling recommendations.   Return in about 2 weeks (around 05/07/2024) for [facility].  Future Appointments  Date Time Provider Department Center  04/25/2024 10:30 AM [facility] NURSE [facility]  04/25/2024 10:45 AM [facility] NST [facility]  05/01/2024  4:00 PM Ronalee Cocking, MD [facility] None  05/07/2024 11:15 AM Derick Fleeting, CNM [facility]  05/22/2024  8:15 AM Tresia Fruit, MD [facility]  05/28/2024  8:55 AM Abigail Abler, MD [facility]  06/04/2024  8:55 AM Jan Mcgill, MD [facility]  06/11/2024 10:55 AM Lacey Pian, MD [facility]    Luevenia Saha, PA-C

## 2024-04-24 ENCOUNTER — Other Ambulatory Visit: Payer: Self-pay

## 2024-04-25 ENCOUNTER — Ambulatory Visit: Payer: MEDICAID | Attending: Obstetrics and Gynecology | Admitting: *Deleted

## 2024-04-25 ENCOUNTER — Ambulatory Visit: Payer: MEDICAID | Admitting: *Deleted

## 2024-04-25 ENCOUNTER — Other Ambulatory Visit: Payer: Self-pay | Admitting: *Deleted

## 2024-04-25 ENCOUNTER — Other Ambulatory Visit: Payer: Self-pay

## 2024-04-25 ENCOUNTER — Other Ambulatory Visit: Payer: Self-pay | Admitting: Obstetrics

## 2024-04-25 ENCOUNTER — Ambulatory Visit (HOSPITAL_BASED_OUTPATIENT_CLINIC_OR_DEPARTMENT_OTHER): Payer: MEDICAID

## 2024-04-25 VITALS — BP 134/68 | HR 84

## 2024-04-25 DIAGNOSIS — Z3A33 33 weeks gestation of pregnancy: Secondary | ICD-10-CM

## 2024-04-25 DIAGNOSIS — O3663X0 Maternal care for excessive fetal growth, third trimester, not applicable or unspecified: Secondary | ICD-10-CM | POA: Insufficient documentation

## 2024-04-25 DIAGNOSIS — O10913 Unspecified pre-existing hypertension complicating pregnancy, third trimester: Secondary | ICD-10-CM | POA: Insufficient documentation

## 2024-04-25 DIAGNOSIS — O09523 Supervision of elderly multigravida, third trimester: Secondary | ICD-10-CM

## 2024-04-25 DIAGNOSIS — O0990 Supervision of high risk pregnancy, unspecified, unspecified trimester: Secondary | ICD-10-CM

## 2024-04-25 DIAGNOSIS — E119 Type 2 diabetes mellitus without complications: Secondary | ICD-10-CM | POA: Insufficient documentation

## 2024-04-25 DIAGNOSIS — O10013 Pre-existing essential hypertension complicating pregnancy, third trimester: Secondary | ICD-10-CM

## 2024-04-25 DIAGNOSIS — O24113 Pre-existing diabetes mellitus, type 2, in pregnancy, third trimester: Secondary | ICD-10-CM

## 2024-04-25 DIAGNOSIS — Z362 Encounter for other antenatal screening follow-up: Secondary | ICD-10-CM | POA: Insufficient documentation

## 2024-04-25 NOTE — Procedures (Signed)
 Chloe Perez [birth year] [redacted]w[redacted]d  Fetus A Non-Stress Test Interpretation for 04/25/24  Indication: Chronic Hypertenstion, Diabetes, and Advanced Maternal Age >40 years  Fetal Heart Rate A Mode: External Baseline Rate (A): 150 bpm Variability: Moderate Accelerations: 10 x 10 (NST for 40 min) Multiple birth?: No  Uterine Activity Mode: Toco Contraction Frequency (min): 1 u/c during NST Contraction Duration (sec): 70 Contraction Quality: Mild Resting Tone Palpated: Relaxed  Interpretation (Fetal Testing) Nonstress Test Interpretation: Non-reactive Comments: Tracing reviewed by Dr. Grayland Le

## 2024-04-25 NOTE — Progress Notes (Unsigned)
 Chloe Perez  mfm

## 2024-05-01 ENCOUNTER — Ambulatory Visit: Payer: Self-pay | Admitting: Internal Medicine

## 2024-05-03 ENCOUNTER — Inpatient Hospital Stay (HOSPITAL_COMMUNITY)
Admission: AD | Admit: 2024-05-03 | Discharge: 2024-05-07 | DRG: 786 | Disposition: A | Discharge status: Home / Self Care | Payer: MEDICAID | Attending: Obstetrics & Gynecology | Admitting: Obstetrics & Gynecology

## 2024-05-03 DIAGNOSIS — O1092 Unspecified pre-existing hypertension complicating childbirth: Secondary | ICD-10-CM | POA: Diagnosis present

## 2024-05-03 DIAGNOSIS — O368130 Decreased fetal movements, third trimester, not applicable or unspecified: Principal | ICD-10-CM | POA: Diagnosis present

## 2024-05-03 DIAGNOSIS — O10919 Unspecified pre-existing hypertension complicating pregnancy, unspecified trimester: Secondary | ICD-10-CM | POA: Diagnosis present

## 2024-05-03 DIAGNOSIS — O368390 Maternal care for abnormalities of the fetal heart rate or rhythm, unspecified trimester, not applicable or unspecified: Principal | ICD-10-CM

## 2024-05-03 DIAGNOSIS — O09523 Supervision of elderly multigravida, third trimester: Secondary | ICD-10-CM

## 2024-05-03 DIAGNOSIS — Z98891 History of uterine scar from previous surgery: Secondary | ICD-10-CM

## 2024-05-03 DIAGNOSIS — O0990 Supervision of high risk pregnancy, unspecified, unspecified trimester: Secondary | ICD-10-CM

## 2024-05-03 DIAGNOSIS — E785 Hyperlipidemia, unspecified: Secondary | ICD-10-CM | POA: Diagnosis present

## 2024-05-03 DIAGNOSIS — O24119 Pre-existing diabetes mellitus, type 2, in pregnancy, unspecified trimester: Secondary | ICD-10-CM | POA: Diagnosis present

## 2024-05-03 DIAGNOSIS — O2412 Pre-existing diabetes mellitus, type 2, in childbirth: Secondary | ICD-10-CM | POA: Diagnosis present

## 2024-05-03 DIAGNOSIS — Z3A34 34 weeks gestation of pregnancy: Secondary | ICD-10-CM

## 2024-05-03 DIAGNOSIS — O34211 Maternal care for low transverse scar from previous cesarean delivery: Secondary | ICD-10-CM | POA: Diagnosis present

## 2024-05-03 DIAGNOSIS — O99214 Obesity complicating childbirth: Secondary | ICD-10-CM | POA: Diagnosis present

## 2024-05-03 DIAGNOSIS — O288 Other abnormal findings on antenatal screening of mother: Secondary | ICD-10-CM | POA: Diagnosis present

## 2024-05-03 DIAGNOSIS — Z3483 Encounter for supervision of other normal pregnancy, third trimester: Secondary | ICD-10-CM

## 2024-05-03 DIAGNOSIS — Z91199 Patient's noncompliance with other medical treatment and regimen due to unspecified reason: Secondary | ICD-10-CM

## 2024-05-03 DIAGNOSIS — O3663X0 Maternal care for excessive fetal growth, third trimester, not applicable or unspecified: Secondary | ICD-10-CM | POA: Diagnosis present

## 2024-05-03 DIAGNOSIS — Z833 Family history of diabetes mellitus: Secondary | ICD-10-CM

## 2024-05-03 DIAGNOSIS — Z8249 Family history of ischemic heart disease and other diseases of the circulatory system: Secondary | ICD-10-CM

## 2024-05-03 DIAGNOSIS — O09529 Supervision of elderly multigravida, unspecified trimester: Secondary | ICD-10-CM

## 2024-05-03 DIAGNOSIS — E66811 Obesity, class 1: Secondary | ICD-10-CM | POA: Diagnosis present

## 2024-05-03 DIAGNOSIS — Z87891 Personal history of nicotine dependence: Secondary | ICD-10-CM

## 2024-05-03 DIAGNOSIS — Z794 Long term (current) use of insulin: Secondary | ICD-10-CM

## 2024-05-03 DIAGNOSIS — Z789 Other specified health status: Secondary | ICD-10-CM | POA: Diagnosis present

## 2024-05-03 DIAGNOSIS — O321XX0 Maternal care for breech presentation, not applicable or unspecified: Secondary | ICD-10-CM | POA: Diagnosis present

## 2024-05-03 DIAGNOSIS — Z7984 Long term (current) use of oral hypoglycemic drugs: Secondary | ICD-10-CM

## 2024-05-03 DIAGNOSIS — O24113 Pre-existing diabetes mellitus, type 2, in pregnancy, third trimester: Secondary | ICD-10-CM

## 2024-05-03 DIAGNOSIS — Z603 Acculturation difficulty: Secondary | ICD-10-CM | POA: Diagnosis present

## 2024-05-03 DIAGNOSIS — E1165 Type 2 diabetes mellitus with hyperglycemia: Secondary | ICD-10-CM | POA: Diagnosis present

## 2024-05-03 DIAGNOSIS — Z7982 Long term (current) use of aspirin: Secondary | ICD-10-CM

## 2024-05-04 ENCOUNTER — Encounter (HOSPITAL_COMMUNITY)
Admission: AD | Disposition: A | Discharge status: Home / Self Care | Payer: Self-pay | Source: Home / Self Care | Attending: Obstetrics & Gynecology

## 2024-05-04 ENCOUNTER — Inpatient Hospital Stay (HOSPITAL_COMMUNITY): Payer: MEDICAID | Admitting: Certified Registered Nurse Anesthetist

## 2024-05-04 ENCOUNTER — Other Ambulatory Visit: Payer: Self-pay

## 2024-05-04 ENCOUNTER — Encounter (HOSPITAL_COMMUNITY): Payer: Self-pay

## 2024-05-04 ENCOUNTER — Encounter (HOSPITAL_COMMUNITY): Payer: Self-pay | Admitting: Family Medicine

## 2024-05-04 ENCOUNTER — Inpatient Hospital Stay (HOSPITAL_BASED_OUTPATIENT_CLINIC_OR_DEPARTMENT_OTHER): Payer: MEDICAID

## 2024-05-04 DIAGNOSIS — Z3A34 34 weeks gestation of pregnancy: Secondary | ICD-10-CM

## 2024-05-04 DIAGNOSIS — O09523 Supervision of elderly multigravida, third trimester: Secondary | ICD-10-CM

## 2024-05-04 DIAGNOSIS — O368130 Decreased fetal movements, third trimester, not applicable or unspecified: Secondary | ICD-10-CM

## 2024-05-04 DIAGNOSIS — O289 Unspecified abnormal findings on antenatal screening of mother: Secondary | ICD-10-CM

## 2024-05-04 DIAGNOSIS — O24424 Gestational diabetes mellitus in childbirth, insulin controlled: Secondary | ICD-10-CM

## 2024-05-04 DIAGNOSIS — E669 Obesity, unspecified: Secondary | ICD-10-CM

## 2024-05-04 DIAGNOSIS — Z3483 Encounter for supervision of other normal pregnancy, third trimester: Secondary | ICD-10-CM

## 2024-05-04 DIAGNOSIS — Z794 Long term (current) use of insulin: Secondary | ICD-10-CM | POA: Diagnosis not present

## 2024-05-04 DIAGNOSIS — R519 Headache, unspecified: Secondary | ICD-10-CM | POA: Diagnosis not present

## 2024-05-04 DIAGNOSIS — E119 Type 2 diabetes mellitus without complications: Secondary | ICD-10-CM

## 2024-05-04 DIAGNOSIS — Z8249 Family history of ischemic heart disease and other diseases of the circulatory system: Secondary | ICD-10-CM | POA: Diagnosis not present

## 2024-05-04 DIAGNOSIS — O3663X0 Maternal care for excessive fetal growth, third trimester, not applicable or unspecified: Secondary | ICD-10-CM | POA: Diagnosis present

## 2024-05-04 DIAGNOSIS — O288 Other abnormal findings on antenatal screening of mother: Secondary | ICD-10-CM | POA: Diagnosis present

## 2024-05-04 DIAGNOSIS — Z7982 Long term (current) use of aspirin: Secondary | ICD-10-CM | POA: Diagnosis not present

## 2024-05-04 DIAGNOSIS — E785 Hyperlipidemia, unspecified: Secondary | ICD-10-CM | POA: Diagnosis present

## 2024-05-04 DIAGNOSIS — O1092 Unspecified pre-existing hypertension complicating childbirth: Secondary | ICD-10-CM | POA: Diagnosis present

## 2024-05-04 DIAGNOSIS — O1002 Pre-existing essential hypertension complicating childbirth: Secondary | ICD-10-CM

## 2024-05-04 DIAGNOSIS — Z87891 Personal history of nicotine dependence: Secondary | ICD-10-CM | POA: Diagnosis not present

## 2024-05-04 DIAGNOSIS — Z833 Family history of diabetes mellitus: Secondary | ICD-10-CM | POA: Diagnosis not present

## 2024-05-04 DIAGNOSIS — E1165 Type 2 diabetes mellitus with hyperglycemia: Secondary | ICD-10-CM | POA: Diagnosis present

## 2024-05-04 DIAGNOSIS — O24113 Pre-existing diabetes mellitus, type 2, in pregnancy, third trimester: Secondary | ICD-10-CM

## 2024-05-04 DIAGNOSIS — O2412 Pre-existing diabetes mellitus, type 2, in childbirth: Secondary | ICD-10-CM | POA: Diagnosis present

## 2024-05-04 DIAGNOSIS — R6883 Chills (without fever): Secondary | ICD-10-CM | POA: Diagnosis not present

## 2024-05-04 DIAGNOSIS — O34211 Maternal care for low transverse scar from previous cesarean delivery: Secondary | ICD-10-CM | POA: Diagnosis present

## 2024-05-04 DIAGNOSIS — N939 Abnormal uterine and vaginal bleeding, unspecified: Secondary | ICD-10-CM | POA: Diagnosis not present

## 2024-05-04 DIAGNOSIS — Z603 Acculturation difficulty: Secondary | ICD-10-CM | POA: Diagnosis present

## 2024-05-04 DIAGNOSIS — O321XX0 Maternal care for breech presentation, not applicable or unspecified: Secondary | ICD-10-CM

## 2024-05-04 DIAGNOSIS — Z7984 Long term (current) use of oral hypoglycemic drugs: Secondary | ICD-10-CM | POA: Diagnosis not present

## 2024-05-04 DIAGNOSIS — O99214 Obesity complicating childbirth: Secondary | ICD-10-CM | POA: Diagnosis present

## 2024-05-04 DIAGNOSIS — O10013 Pre-existing essential hypertension complicating pregnancy, third trimester: Secondary | ICD-10-CM

## 2024-05-04 DIAGNOSIS — R42 Dizziness and giddiness: Secondary | ICD-10-CM | POA: Diagnosis not present

## 2024-05-04 DIAGNOSIS — Z98891 History of uterine scar from previous surgery: Secondary | ICD-10-CM | POA: Diagnosis not present

## 2024-05-04 DIAGNOSIS — O165 Unspecified maternal hypertension, complicating the puerperium: Secondary | ICD-10-CM | POA: Diagnosis not present

## 2024-05-04 DIAGNOSIS — R112 Nausea with vomiting, unspecified: Secondary | ICD-10-CM | POA: Diagnosis not present

## 2024-05-04 LAB — PROTEIN / CREATININE RATIO, URINE
Creatinine, Urine: 23 mg/dL
Total Protein, Urine: <6 mg/dL

## 2024-05-04 LAB — CBC
HCT: 38.1 % (ref 36.0–46.0)
HCT: 35.3 % — ABNORMAL LOW (ref 36.0–46.0)
Hemoglobin: 12.5 g/dL (ref 12.0–15.0)
Hemoglobin: 11.9 g/dL — ABNORMAL LOW (ref 12.0–15.0)
MCH: 27.9 pg (ref 26.0–34.0)
MCH: 28.5 pg (ref 26.0–34.0)
MCHC: 32.8 g/dL (ref 30.0–36.0)
MCHC: 33.7 g/dL (ref 30.0–36.0)
MCV: 85 fL (ref 80.0–100.0)
MCV: 84.7 fL (ref 80.0–100.0)
Platelets: 195 10*3/uL (ref 150–400)
Platelets: 177 10*3/uL (ref 150–400)
RBC: 4.48 MIL/uL (ref 3.87–5.11)
RBC: 4.17 MIL/uL (ref 3.87–5.11)
RDW: 13.6 % (ref 11.5–15.5)
RDW: 13.8 % (ref 11.5–15.5)
WBC: 7.9 10*3/uL (ref 4.0–10.5)
WBC: 7.1 10*3/uL (ref 4.0–10.5)
nRBC: 0 % (ref 0.0–0.2)
nRBC: 0 % (ref 0.0–0.2)

## 2024-05-04 LAB — COMPREHENSIVE METABOLIC PANEL WITH GFR
ALT: 28 U/L (ref 0–44)
AST: 32 U/L (ref 15–41)
Albumin: 3 g/dL — ABNORMAL LOW (ref 3.5–5.0)
Alkaline Phosphatase: 96 U/L (ref 38–126)
Anion gap: 12 (ref 5–15)
BUN: 16 mg/dL (ref 6–20)
CO2: 19 mmol/L — ABNORMAL LOW (ref 22–32)
Calcium: 9.6 mg/dL (ref 8.9–10.3)
Chloride: 103 mmol/L (ref 98–111)
Creatinine, Ser: 0.62 mg/dL (ref 0.44–1.00)
GFR, Estimated: >60 mL/min (ref >60)
Glucose, Bld: 124 mg/dL — ABNORMAL HIGH (ref 70–99)
Potassium: 3.9 mmol/L (ref 3.5–5.1)
Sodium: 134 mmol/L — ABNORMAL LOW (ref 135–145)
Total Bilirubin: 0.4 mg/dL (ref 0.0–1.2)
Total Protein: 6.6 g/dL (ref 6.5–8.1)

## 2024-05-04 LAB — TYPE AND SCREEN
ABO/RH(D): B POS
Antibody Screen: NEGATIVE

## 2024-05-04 LAB — GLUCOSE, CAPILLARY
Glucose-Capillary: 132 mg/dL — ABNORMAL HIGH (ref 70–99)
Glucose-Capillary: 97 mg/dL (ref 70–99)
Glucose-Capillary: 110 mg/dL — ABNORMAL HIGH (ref 70–99)
Glucose-Capillary: 149 mg/dL — ABNORMAL HIGH (ref 70–99)
Glucose-Capillary: 230 mg/dL — ABNORMAL HIGH (ref 70–99)
Glucose-Capillary: 222 mg/dL — ABNORMAL HIGH (ref 70–99)

## 2024-05-04 LAB — RPR: RPR Ser Ql: NONREACTIVE

## 2024-05-04 SURGERY — Surgical Case
Anesthesia: Spinal

## 2024-05-04 MED ORDER — ACETAMINOPHEN 500 MG PO TABS
1000.0000 mg | ORAL_TABLET | Freq: Four times a day (QID) | ORAL | Status: AC
  Administered 2024-05-04 – 2024-05-05 (×3): 1000 mg via ORAL
  Filled 2024-05-04 (×3): qty 2

## 2024-05-04 MED ORDER — KETOROLAC TROMETHAMINE 30 MG/ML IJ SOLN: 30.0000 mg | Freq: Four times a day (QID) | INTRAMUSCULAR | Status: DC | PRN

## 2024-05-04 MED ORDER — LACTATED RINGERS IV BOLUS
1000.0000 mL | Freq: Once | INTRAVENOUS | Status: AC
  Administered 2024-05-04: 1000 mL via INTRAVENOUS

## 2024-05-04 MED ORDER — ACETAMINOPHEN 10 MG/ML IV SOLN
INTRAVENOUS | Status: AC
  Filled 2024-05-04: qty 100

## 2024-05-04 MED ORDER — SOD CITRATE-CITRIC ACID 500-334 MG/5ML PO SOLN
30.0000 mL | ORAL | Status: DC
Start: 2024-05-04 — End: 2024-05-04

## 2024-05-04 MED ORDER — OXYCODONE HCL 5 MG PO TABS: 5.0000 mg | ORAL_TABLET | Freq: Once | ORAL | Status: DC | PRN

## 2024-05-04 MED ORDER — METFORMIN HCL ER 500 MG PO TB24: 1000.0000 mg | ORAL_TABLET | Freq: Two times a day (BID) | ORAL | Status: DC

## 2024-05-04 MED ORDER — KETOROLAC TROMETHAMINE 30 MG/ML IJ SOLN
30.0000 mg | Freq: Four times a day (QID) | INTRAMUSCULAR | Status: AC
  Administered 2024-05-04 – 2024-05-05 (×4): 30 mg via INTRAVENOUS
  Filled 2024-05-04 (×4): qty 1

## 2024-05-04 MED ORDER — FENTANYL CITRATE (PF) 100 MCG/2ML IJ SOLN: 25.0000 ug | INTRAMUSCULAR | Status: DC | PRN

## 2024-05-04 MED ORDER — SCOPOLAMINE 1 MG/3DAYS TD PT72: 1.0000 | MEDICATED_PATCH | Freq: Once | TRANSDERMAL | Status: DC

## 2024-05-04 MED ORDER — SENNOSIDES-DOCUSATE SODIUM 8.6-50 MG PO TABS
2.0000 | ORAL_TABLET | ORAL | Status: DC
  Administered 2024-05-05 – 2024-05-07 (×2): 2 via ORAL
  Filled 2024-05-04 (×3): qty 2

## 2024-05-04 MED ORDER — SODIUM CHLORIDE 0.9% FLUSH: 3.0000 mL | INTRAVENOUS | Status: DC | PRN

## 2024-05-04 MED ORDER — ONDANSETRON HCL 4 MG/2ML IJ SOLN: 4.0000 mg | Freq: Three times a day (TID) | INTRAMUSCULAR | Status: DC | PRN

## 2024-05-04 MED ORDER — ENOXAPARIN SODIUM 40 MG/0.4ML IJ SOSY
40.0000 mg | PREFILLED_SYRINGE | INTRAMUSCULAR | Status: DC
  Administered 2024-05-05 – 2024-05-07 (×3): 40 mg via SUBCUTANEOUS
  Filled 2024-05-04 (×3): qty 0.4

## 2024-05-04 MED ORDER — OXYTOCIN-SODIUM CHLORIDE 30-0.9 UT/500ML-% IV SOLN
INTRAVENOUS | Status: DC | PRN
  Administered 2024-05-04: 300 mL via INTRAVENOUS

## 2024-05-04 MED ORDER — CEFAZOLIN SODIUM-DEXTROSE 2-4 GM/100ML-% IV SOLN: 2.0000 g | INTRAVENOUS | Status: DC

## 2024-05-04 MED ORDER — MORPHINE SULFATE (PF) 0.5 MG/ML IJ SOLN
INTRAMUSCULAR | Status: AC
Start: 2024-05-04 — End: ?
  Filled 2024-05-04: qty 10

## 2024-05-04 MED ORDER — SIMETHICONE 80 MG PO CHEW
80.0000 mg | CHEWABLE_TABLET | Freq: Three times a day (TID) | ORAL | Status: DC
  Administered 2024-05-04 – 2024-05-07 (×8): 80 mg via ORAL
  Filled 2024-05-04 (×8): qty 1

## 2024-05-04 MED ORDER — MORPHINE SULFATE (PF) 0.5 MG/ML IJ SOLN
INTRAMUSCULAR | Status: DC | PRN
  Administered 2024-05-04: 150 ug via INTRATHECAL

## 2024-05-04 MED ORDER — AZITHROMYCIN 500 MG IV SOLR: 500.0000 mg | INTRAVENOUS | Status: DC

## 2024-05-04 MED ORDER — INSULIN ASPART 100 UNIT/ML IJ SOLN: 0.0000 [IU] | Freq: Three times a day (TID) | INTRAMUSCULAR | Status: DC

## 2024-05-04 MED ORDER — COCONUT OIL OIL
1.0000 | TOPICAL_OIL | Status: DC | PRN
  Administered 2024-05-05: 1 via TOPICAL

## 2024-05-04 MED ORDER — PHENYLEPHRINE 80 MCG/ML (10ML) SYRINGE FOR IV PUSH (FOR BLOOD PRESSURE SUPPORT)
PREFILLED_SYRINGE | INTRAVENOUS | Status: DC | PRN
  Administered 2024-05-04: 80 ug via INTRAVENOUS
  Administered 2024-05-04: 120 ug via INTRAVENOUS

## 2024-05-04 MED ORDER — INSULIN ASPART 100 UNIT/ML IJ SOLN
0.0000 [IU] | Freq: Three times a day (TID) | INTRAMUSCULAR | Status: DC
  Administered 2024-05-04: 5 [IU] via SUBCUTANEOUS
  Administered 2024-05-05: 3 [IU] via SUBCUTANEOUS
  Administered 2024-05-05 – 2024-05-06 (×3): 2 [IU] via SUBCUTANEOUS

## 2024-05-04 MED ORDER — PRENATAL MULTIVITAMIN CH
1.0000 | ORAL_TABLET | Freq: Every day | ORAL | Status: DC
Start: 2024-05-04 — End: 2024-05-07
  Administered 2024-05-05 – 2024-05-07 (×3): 1 via ORAL
  Filled 2024-05-04 (×3): qty 1

## 2024-05-04 MED ORDER — LABETALOL HCL 100 MG PO TABS
300.0000 mg | ORAL_TABLET | Freq: Once | ORAL | Status: AC
  Administered 2024-05-04: 300 mg via ORAL
  Filled 2024-05-04: qty 3

## 2024-05-04 MED ORDER — HYDROCODONE-ACETAMINOPHEN 5-325 MG PO TABS
1.0000 | ORAL_TABLET | ORAL | Status: DC | PRN
  Administered 2024-05-05: 1 via ORAL
  Administered 2024-05-05 – 2024-05-06 (×3): 2 via ORAL
  Filled 2024-05-04: qty 2
  Filled 2024-05-04: qty 1
  Filled 2024-05-04 (×2): qty 2

## 2024-05-04 MED ORDER — DIPHENHYDRAMINE HCL 25 MG PO CAPS
25.0000 mg | ORAL_CAPSULE | Freq: Four times a day (QID) | ORAL | Status: DC | PRN
Start: 2024-05-04 — End: 2024-05-07

## 2024-05-04 MED ORDER — METFORMIN HCL ER 500 MG PO TB24
500.0000 mg | ORAL_TABLET | Freq: Two times a day (BID) | ORAL | Status: DC
  Administered 2024-05-04: 500 mg via ORAL
  Filled 2024-05-04: qty 1

## 2024-05-04 MED ORDER — OXYTOCIN-SODIUM CHLORIDE 30-0.9 UT/500ML-% IV SOLN
2.5000 [IU]/h | INTRAVENOUS | Status: AC
  Administered 2024-05-04: 2.5 [IU]/h via INTRAVENOUS
  Filled 2024-05-04: qty 500

## 2024-05-04 MED ORDER — ACETAMINOPHEN 10 MG/ML IV SOLN
INTRAVENOUS | Status: DC | PRN
  Administered 2024-05-04: 1000 mg via INTRAVENOUS

## 2024-05-04 MED ORDER — LACTATED RINGERS IV SOLN: INTRAVENOUS | Status: DC

## 2024-05-04 MED ORDER — CEFAZOLIN SODIUM-DEXTROSE 2-4 GM/100ML-% IV SOLN
2.0000 g | INTRAVENOUS | Status: AC
  Administered 2024-05-04: 2 g via INTRAVENOUS
  Filled 2024-05-04: qty 100

## 2024-05-04 MED ORDER — WITCH HAZEL-GLYCERIN EX PADS: 1.0000 | MEDICATED_PAD | CUTANEOUS | Status: DC | PRN

## 2024-05-04 MED ORDER — FENTANYL CITRATE (PF) 100 MCG/2ML IJ SOLN
INTRAMUSCULAR | Status: DC | PRN
  Administered 2024-05-04: 15 ug via INTRATHECAL

## 2024-05-04 MED ORDER — STERILE WATER FOR IRRIGATION IR SOLN
Status: DC | PRN
  Administered 2024-05-04: 1

## 2024-05-04 MED ORDER — IBUPROFEN 600 MG PO TABS
600.0000 mg | ORAL_TABLET | Freq: Four times a day (QID) | ORAL | Status: DC
Start: 2024-05-05 — End: 2024-05-07
  Administered 2024-05-05 – 2024-05-07 (×7): 600 mg via ORAL
  Filled 2024-05-04 (×8): qty 1

## 2024-05-04 MED ORDER — FENTANYL CITRATE (PF) 100 MCG/2ML IJ SOLN
INTRAMUSCULAR | Status: AC
  Filled 2024-05-04: qty 2

## 2024-05-04 MED ORDER — METOCLOPRAMIDE HCL 5 MG/ML IJ SOLN
INTRAMUSCULAR | Status: DC | PRN
  Administered 2024-05-04: 10 mg via INTRAVENOUS

## 2024-05-04 MED ORDER — INSULIN NPH (HUMAN) (ISOPHANE) 100 UNIT/ML ~~LOC~~ SUSP
15.0000 [IU] | Freq: Two times a day (BID) | SUBCUTANEOUS | Status: DC
  Administered 2024-05-04 – 2024-05-06 (×4): 15 [IU] via SUBCUTANEOUS
  Filled 2024-05-04 (×2): qty 10

## 2024-05-04 MED ORDER — DIPHENHYDRAMINE HCL 25 MG PO CAPS: 25.0000 mg | ORAL_CAPSULE | ORAL | Status: DC | PRN

## 2024-05-04 MED ORDER — TRANEXAMIC ACID-NACL 1000-0.7 MG/100ML-% IV SOLN
INTRAVENOUS | Status: AC
  Filled 2024-05-04: qty 100

## 2024-05-04 MED ORDER — NALOXONE HCL 4 MG/10ML IJ SOLN: 1.0000 ug/kg/h | INTRAVENOUS | Status: DC | PRN

## 2024-05-04 MED ORDER — DEXAMETHASONE SODIUM PHOSPHATE 10 MG/ML IJ SOLN
INTRAMUSCULAR | Status: DC | PRN
  Administered 2024-05-04 (×2): 5 mg via INTRAVENOUS

## 2024-05-04 MED ORDER — DIBUCAINE (PERIANAL) 1 % EX OINT: 1.0000 | TOPICAL_OINTMENT | CUTANEOUS | Status: DC | PRN

## 2024-05-04 MED ORDER — SIMETHICONE 80 MG PO CHEW: 80.0000 mg | CHEWABLE_TABLET | ORAL | Status: DC | PRN

## 2024-05-04 MED ORDER — HYDROMORPHONE HCL 1 MG/ML IJ SOLN: 0.2000 mg | INTRAMUSCULAR | Status: DC | PRN

## 2024-05-04 MED ORDER — MENTHOL 3 MG MT LOZG: 1.0000 | LOZENGE | OROMUCOSAL | Status: DC | PRN

## 2024-05-04 MED ORDER — SODIUM CHLORIDE 0.9% FLUSH
3.0000 mL | Freq: Two times a day (BID) | INTRAVENOUS | Status: DC
  Administered 2024-05-05: 3 mL via INTRAVENOUS
  Administered 2024-05-05: 10 mL via INTRAVENOUS

## 2024-05-04 MED ORDER — METFORMIN HCL ER 500 MG PO TB24
1000.0000 mg | ORAL_TABLET | Freq: Two times a day (BID) | ORAL | Status: DC
  Administered 2024-05-04 – 2024-05-07 (×6): 1000 mg via ORAL
  Filled 2024-05-04 (×6): qty 2

## 2024-05-04 MED ORDER — BUPIVACAINE IN DEXTROSE 0.75-8.25 % IT SOLN
INTRATHECAL | Status: DC | PRN
  Administered 2024-05-04: 1.8 mL via INTRATHECAL

## 2024-05-04 MED ORDER — SOD CITRATE-CITRIC ACID 500-334 MG/5ML PO SOLN
30.0000 mL | ORAL | Status: AC
  Administered 2024-05-04: 30 mL via ORAL
  Filled 2024-05-04: qty 30

## 2024-05-04 MED ORDER — KETOROLAC TROMETHAMINE 30 MG/ML IJ SOLN: 30.0000 mg | Freq: Once | INTRAMUSCULAR | Status: DC | PRN

## 2024-05-04 MED ORDER — DIPHENHYDRAMINE HCL 50 MG/ML IJ SOLN
12.5000 mg | INTRAMUSCULAR | Status: DC | PRN
  Administered 2024-05-04: 12.5 mg via INTRAVENOUS

## 2024-05-04 MED ORDER — SODIUM CHLORIDE 0.9 % IR SOLN
Status: DC | PRN
  Administered 2024-05-04: 1

## 2024-05-04 MED ORDER — LABETALOL HCL 200 MG PO TABS
300.0000 mg | ORAL_TABLET | Freq: Three times a day (TID) | ORAL | Status: DC
  Administered 2024-05-04 – 2024-05-06 (×6): 300 mg via ORAL
  Filled 2024-05-04 (×6): qty 1

## 2024-05-04 MED ORDER — PHENYLEPHRINE HCL-NACL 20-0.9 MG/250ML-% IV SOLN
INTRAVENOUS | Status: DC | PRN
  Administered 2024-05-04: 60 ug/min via INTRAVENOUS

## 2024-05-04 MED ORDER — ONDANSETRON HCL 4 MG/2ML IJ SOLN
INTRAMUSCULAR | Status: DC | PRN
  Administered 2024-05-04: 4 mg via INTRAVENOUS

## 2024-05-04 MED ORDER — TRANEXAMIC ACID-NACL 1000-0.7 MG/100ML-% IV SOLN
1000.0000 mg | Freq: Once | INTRAVENOUS | Status: AC
  Administered 2024-05-04: 1000 mg via INTRAVENOUS

## 2024-05-04 MED ORDER — NALOXONE HCL 0.4 MG/ML IJ SOLN: 0.4000 mg | INTRAMUSCULAR | Status: DC | PRN

## 2024-05-04 MED ORDER — OXYCODONE HCL 5 MG/5ML PO SOLN: 5.0000 mg | Freq: Once | ORAL | Status: DC | PRN

## 2024-05-04 SURGICAL SUPPLY — 29 items
BENZOIN TINCTURE PRP APPL 2/3 (GAUZE/BANDAGES/DRESSINGS) IMPLANT
CHLORAPREP W/TINT 26 (MISCELLANEOUS) ×2 IMPLANT
CLAMP UMBILICAL CORD (MISCELLANEOUS) ×1 IMPLANT
CLOTH BEACON ORANGE TIMEOUT ST (SAFETY) ×1 IMPLANT
DERMABOND ADVANCED .7 DNX12 (GAUZE/BANDAGES/DRESSINGS) IMPLANT
DRSG OPSITE POSTOP 4X10 (GAUZE/BANDAGES/DRESSINGS) ×1 IMPLANT
ELECTRODE REM PT RTRN 9FT ADLT (ELECTROSURGICAL) ×1 IMPLANT
EXTRACTOR VACUUM KIWI (MISCELLANEOUS) IMPLANT
GLOVE BIOGEL PI IND STRL 7.0 (GLOVE) ×2 IMPLANT
GLOVE ECLIPSE 7.0 STRL STRAW (GLOVE) ×1 IMPLANT
GOWN STRL REUS W/TWL LRG LVL3 (GOWN DISPOSABLE) ×2 IMPLANT
KIT ABG SYR 3ML LUER SLIP (SYRINGE) ×1 IMPLANT
NDL HYPO 25X5/8 SAFETYGLIDE (NEEDLE) ×1 IMPLANT
NEEDLE HYPO 25X5/8 SAFETYGLIDE (NEEDLE) ×1 IMPLANT
NS IRRIG 1000ML POUR BTL (IV SOLUTION) ×1 IMPLANT
PACK C SECTION WH (CUSTOM PROCEDURE TRAY) ×1 IMPLANT
PAD OB MATERNITY 4.3X12.25 (PERSONAL CARE ITEMS) ×1 IMPLANT
RTRCTR C-SECT PINK 25CM LRG (MISCELLANEOUS) ×1 IMPLANT
STRIP CLOSURE SKIN 1/2X4 (GAUZE/BANDAGES/DRESSINGS) IMPLANT
SUT MNCRL 0 VIOLET CTX 36 (SUTURE) ×1 IMPLANT
SUT PLAIN 0 NONE (SUTURE) IMPLANT
SUT PLAIN 2 0 XLH (SUTURE) IMPLANT
SUT PLAIN ABS 2-0 CT1 27XMFL (SUTURE) IMPLANT
SUT VIC AB 0 CTX36XBRD ANBCTRL (SUTURE) ×1 IMPLANT
SUT VIC AB 2-0 CT1 TAPERPNT 27 (SUTURE) IMPLANT
SUT VIC AB 4-0 KS 27 (SUTURE) ×1 IMPLANT
TOWEL OR 17X24 6PK STRL BLUE (TOWEL DISPOSABLE) ×1 IMPLANT
TRAY FOLEY W/BAG SLVR 14FR LF (SET/KITS/TRAYS/PACK) IMPLANT
WATER STERILE IRR 1000ML POUR (IV SOLUTION) ×1 IMPLANT

## 2024-05-04 NOTE — H&P (Signed)
 LABOR AND DELIVERY ADMISSION HISTORY AND PHYSICAL NOTE  Lizzete Gough Tekela Garguilo is a 48 y.o. female [phone] with IUP at [redacted]w[redacted]d presenting for contractions and decreased fetal movement.   Patient presented to MAU reporting decreased fetal movement since last evening No leaking fluid or vaginal bleeding Some contractions as well  Patient's history notable for type 2 DM, cHTN, and AMA (48 y/o)  On workup in MAU had BPP around 0100 that was 4/10 (no breathing or movement, non-reactive NST), with AEDF and REDF She was kept for prolonged monitoring, repeat BPP around 0800 was 8/8 with AEDF and no REDF However given significant comorbidities and non-reassuring fetal testing decision made to proceed with delivery  She plans on breast feeding feeding. Her contraception plan is: undecided.  Prenatal History/Complications: PNC at [facility], [facility]  Sono:  @[redacted]w[redacted]d , CWD, normal anatomy, not described presentation, anterior placenta, >99%ile, EFW 2696g, AC >99%  Pregnancy complications:  Patient Active Problem List   Diagnosis Date Noted   NST (non-stress test) nonreactive 05/04/2024   Uncontrolled diabetes mellitus with hyperglycemia (HCC) 04/04/2024   Chronic hypertension during pregnancy, antepartum 01/18/2024   Poor compliance 01/08/2024   Supervision of high risk pregnancy, antepartum 12/19/2023   Heart murmur, systolic 10/24/2023   Obesity (BMI 30.0-34.9) 10/24/2023   History of chest pain 10/24/2023   History of cesarean delivery 05/29/2018   Non-English speaking patient 02/14/2018   Advanced maternal age in multigravida 02/14/2018   Pre-existing type 2 diabetes mellitus in pregnancy 02/08/2017   Type II diabetes mellitus (HCC) 06/03/2012    Past Medical History: Past Medical History:  Diagnosis Date   Diabetes mellitus without complication (HCC)    DM type 2 metformin     Female hypogonadism 02/08/2017   Heart pain    Hyperlipidemia associated with type 2  diabetes mellitus (HCC) 10/24/2023   Missed AB    PCOS (polycystic ovarian syndrome) 02/08/2017    Past Surgical History: Past Surgical History:  Procedure Laterality Date   CESAREAN SECTION N/A 05/28/2018   Procedure: CESAREAN SECTION;  Surgeon: Malka Sea, DO;  Location: [facility];  Service: Obstetrics;  Laterality: N/A;   Uterine polyps     Had two different surgeris to remove--last 7 years ago.    Obstetrical History: OB History     Gravida  3   Para  1   Term      Preterm  1   AB  1   Living  1      SAB  1   IAB      Ectopic      Multiple      Live Births  1           Social History: Social History   Socioeconomic History   Marital status: Married    Spouse name: Fortunato Ill   Number of children: 1   Years of education: 9   Highest education level: Not on file  Occupational History   Occupation: Hair stylist  Tobacco Use   Smoking status: Former    Current packs/day: 0.00    Types: Cigarettes    Quit date: 09/28/2023    Years since quitting: 0.6   Smokeless tobacco: Never   Tobacco comments:    Discussed nicotine gum and how to use.  Vaping Use   Vaping status: Never Used  Substance and Sexual Activity   Alcohol  use: Not Currently    Comment: sometimes   Drug use: No  Sexual activity: Yes    Birth control/protection: None  Other Topics Concern   Not on file  Social History Narrative   Lives at home with long time female partner and their son.     Social Drivers of Corporate investment banker Strain: Low Risk  (10/24/2023)   Overall Financial Resource Strain (CARDIA)    Difficulty of Paying Living Expenses: Not hard at all  Food Insecurity: No Food Insecurity (04/12/2024)   Hunger Vital Sign    Worried About Running Out of Food in the Last Year: Never true    Ran Out of Food in the Last Year: Never true  Transportation Needs: No Transportation Needs (04/12/2024)   PRAPARE - Administrator, Civil Service  (Medical): No    Lack of Transportation (Non-Medical): No  Physical Activity: Not on file  Stress: Not on file  Social Connections: Unknown (04/12/2024)   Social Connection and Isolation Panel [NHANES]    Frequency of Communication with Friends and Family: Never    Frequency of Social Gatherings with Friends and Family: Not on file    Attends Religious Services: Not on Marketing executive or Organizations: Not on file    Attends Banker Meetings: Not on file    Marital Status: Not on file    Family History: Family History  Problem Relation Age of Onset   Hypertension Mother    Diabetes Mother    Heart disease Father     Allergies: No Known Allergies  Medications Prior to Admission  Medication Sig Dispense Refill Last Dose/Taking   aspirin  EC 81 MG tablet Take 1 tablet (81 mg total) by mouth daily. Swallow whole. 60 tablet 1 05/03/2024   insulin  lispro (HUMALOG ) 100 UNIT/ML injection Inject 0.28 mLs (28 Units total) into the skin 3 (three) times daily with meals. 10 mL 11 05/04/2024 Morning   insulin  NPH Human (NOVOLIN  N) 100 UNIT/ML injection Inject 0.38 mLs (38 Units total) into the skin 2 (two) times daily at 8 am and 10 pm. 10 mL 11 05/04/2024 Morning   metFORMIN  (GLUCOPHAGE -XR) 500 MG 24 hr tablet 2 tabs by mouth twice daily. 120 tablet 11 05/04/2024 Morning   Alcohol  Swabs (ALCOHOL  PADS) 70 % PADS 1 Pad by Does not apply route 4 (four) times daily. 100 each 2    Blood Pressure Monitoring (OMRON 3 SERIES BP MONITOR) DEVI Use to take blood pressure as needed 1 each 0    Continuous Glucose Sensor (FREESTYLE LIBRE 3 SENSOR) MISC 1 Device by Does not apply route as directed. Place 1 sensor on the skin every 14 days. Use to check glucose continuously 2 each 2    Insulin  Pen Needle 31G X 6 MM MISC 1 Units by Does not apply route 4 (four) times daily. 100 each 6    Insulin  Syringe-Needle U-100 30G X 1/2" 0.3 ML MISC Use 1 syringe to inject insulin  three times a day. 100  each 0    labetalol  (NORMODYNE ) 300 MG tablet Take 1 tablet (300 mg total) by mouth 3 (three) times daily. 120 tablet 3    nitroGLYCERIN  (NITROSTAT ) 0.4 MG SL tablet 1 tab under tongue as needed for chest pain may repeat every 5 minutes times 2 if pain not relieved (Patient not taking: Reported on 04/18/2024) 25 tablet 1    prenatal vitamin w/FE, FA (PRENATAL 1 + 1) 27-1 MG TABS tablet Take 1 tablet by mouth daily. 30 tablet 11  Review of Systems  All systems reviewed and negative except as stated in HPI  Physical Exam BP (!) 159/61 (BP Location: Right Arm)   Pulse 80   Temp 97.8 F (36.6 C) (Oral)   Resp 16   Wt 79.2 kg   LMP 08/13/2023 (Approximate)   SpO2 97%   BMI 34.12 kg/m   Physical Exam Constitutional:      General: She is not in acute distress.    Appearance: Normal appearance. She is not ill-appearing.  HENT:     Head: Atraumatic.  Eyes:     General: No scleral icterus.    Conjunctiva/sclera: Conjunctivae normal.  Pulmonary:     Effort: Pulmonary effort is normal.  Skin:    General: Skin is warm and dry.     Coloration: Skin is not jaundiced or pale.  Neurological:     Mental Status: She is alert.     Coordination: Coordination normal.  Psychiatric:        Mood and Affect: Mood normal.        Behavior: Behavior normal.      Baseline: 150 Variability: moderate Accels: present Decels: absent Toco: rare contractions  Prenatal labs: ABO, Rh: --/--/B POS (06/08 0225) Antibody: NEG (06/08 0225) Rubella:   RPR:    HBsAg:    HIV:    GC/Chlamydia:  Neisseria Gonorrhea  Date Value Ref Range Status  01/07/2024 Negative  Final   Chlamydia  Date Value Ref Range Status  01/07/2024 Negative  Final   GBS:    Prenatal Transfer Tool  Maternal Diabetes: Yes:  Diabetes Type:  Insulin /Medication controlled Genetic Screening: Normal Maternal Ultrasounds/Referrals: Normal Fetal Ultrasounds or other Referrals:  Fetal echo at [facility], normal Maternal  Substance Abuse:  No Significant Maternal Medications:  Meds include: Other: Insulin , metformin , ASA Significant Maternal Lab Results: Other: GBS unknown  Results for orders placed or performed during the hospital encounter of 05/03/24 (from the past 24 hours)  Protein / creatinine ratio, urine   Collection Time: 05/04/24 12:19 AM  Result Value Ref Range   Creatinine, Urine 23 mg/dL   Total Protein, Urine <6 mg/dL   Protein Creatinine Ratio        0.00 - 0.15 mg/mg[Cre]  Glucose, capillary   Collection Time: 05/04/24  1:54 AM  Result Value Ref Range   Glucose-Capillary 132 (H) 70 - 99 mg/dL  CBC   Collection Time: 05/04/24  2:25 AM  Result Value Ref Range   WBC 7.9 4.0 - 10.5 K/uL   RBC 4.48 3.87 - 5.11 MIL/uL   Hemoglobin 12.5 12.0 - 15.0 g/dL   HCT 40.9 81.1 - 91.4 %   MCV 85.0 80.0 - 100.0 fL   MCH 27.9 26.0 - 34.0 pg   MCHC 32.8 30.0 - 36.0 g/dL   RDW 78.2 95.6 - 21.3 %   Platelets 195 150 - 400 K/uL   nRBC 0.0 0.0 - 0.2 %  Comprehensive metabolic panel   Collection Time: 05/04/24  2:25 AM  Result Value Ref Range   Sodium 134 (L) 135 - 145 mmol/L   Potassium 3.9 3.5 - 5.1 mmol/L   Chloride 103 98 - 111 mmol/L   CO2 19 (L) 22 - 32 mmol/L   Glucose, Bld 124 (H) 70 - 99 mg/dL   BUN 16 6 - 20 mg/dL   Creatinine, Ser 0.86 0.44 - 1.00 mg/dL   Calcium  9.6 8.9 - 10.3 mg/dL   Total Protein 6.6 6.5 - 8.1 g/dL   Albumin  3.0 (L) 3.5 - 5.0 g/dL   AST 32 15 - 41 U/L   ALT 28 0 - 44 U/L   Alkaline Phosphatase 96 38 - 126 U/L   Total Bilirubin 0.4 0.0 - 1.2 mg/dL   GFR, Estimated >62 >13 mL/min   Anion gap 12 5 - 15  Type and screen [facility]   Collection Time: 05/04/24  2:25 AM  Result Value Ref Range   ABO/RH(D) B POS    Antibody Screen NEG    Sample Expiration      05/07/2024,2359 Performed at [facility], [address] [place], [place] [address]     Assessment: Jaiya Mooradian is a 48 y.o. [phone] at [redacted]w[redacted]d here for  unscheduled, urgent cesarean section due to non-reassuring fetal testing in setting of multiple uncontrolled comorbidities including type 2 DM and cHTN with significant risk for stillbirth.   #Repeat Low Transverse Cesarean  The risks of cesarean section were discussed with the patient including but were not limited to: bleeding which may require transfusion or reoperation; infection which may require antibiotics; injury to bowel, bladder, ureters or other surrounding organs; injury to the fetus; need for additional procedures including hysterectomy in the event of a life-threatening hemorrhage; placental abnormalities wth subsequent pregnancies, incisional problems, thromboembolic phenomenon and other postoperative/anesthesia complications.  Patient also desires permanent sterilization.  Other reversible forms of contraception were discussed with patient; she declines all other modalities. Risks of procedure discussed with patient including but not limited to: risk of regret, permanence of method, bleeding, infection, injury to surrounding organs and need for additional procedures.  Failure risk of about 1% with increased risk of ectopic gestation if pregnancy occurs was also discussed with patient.  Also discussed possibility of post-tubal pain syndrome. The patient concurred with the proposed plan, giving informed written consent for the procedures.  Patient NPO status waived given urgency of case. Anesthesia and OR aware.  Preoperative prophylactic antibiotics and SCDs ordered on call to the OR.   Discussed BTL at length but patient remains undecided. Reviewed that if she decides to proceed she can tell us  at any point up until we are closing.   #Anesthesia: spinal #FWB: Currently Category I #GBS/ID: Unknown #MOF: breast feeding #MOC: undecided #Circ: TBD  #Type 2 DM: DM coordinator consult postpartum. Fetal echo was normal.   #cHTN: labetalol  300 TID, cont postpartum  Teena Feast,  MD/MPH Attending Family Medicine Physician, [facility], [facility]  05/04/2024, 8:52 AM

## 2024-05-04 NOTE — Progress Notes (Signed)
 Pt to OR via stretcher.

## 2024-05-04 NOTE — Anesthesia Postprocedure Evaluation (Signed)
 Anesthesia Post Note  Patient: Mintie De La Paz Ramirez-Alvarado  Procedure(s) Performed: CESAREAN DELIVERY     Patient location during evaluation: PACU Anesthesia Type: Spinal Level of consciousness: oriented and awake and alert Pain management: pain level controlled Vital Signs Assessment: post-procedure vital signs reviewed and stable Respiratory status: spontaneous breathing, respiratory function stable and patient connected to nasal cannula oxygen Cardiovascular status: blood pressure returned to baseline and stable Postop Assessment: no headache, no backache and no apparent nausea or vomiting Anesthetic complications: no  No notable events documented.  Last Vitals:  Vitals:   05/04/24 1352 05/04/24 1440  BP: 126/71   Pulse: 72 87  Resp: 17   Temp: 36.7 C   SpO2: 95% 96%    Last Pain:  Vitals:   05/04/24 1442  TempSrc:   PainSc: 0-No pain   Pain Goal:                   Gurney Balthazor L Ayrabella Labombard

## 2024-05-04 NOTE — Anesthesia Preprocedure Evaluation (Signed)
 Anesthesia Evaluation  Patient identified by MRN, date of birth, ID band Patient awake    Reviewed: Allergy & Precautions, NPO status , Patient's Chart, lab work & pertinent test results, reviewed documented beta blocker date and time   Airway Mallampati: II  TM Distance: >3 FB Neck ROM: Full    Dental no notable dental hx.    Pulmonary neg pulmonary ROS, former smoker   Pulmonary exam normal breath sounds clear to auscultation       Cardiovascular hypertension, Pt. on home beta blockers and Pt. on medications Normal cardiovascular exam Rhythm:Regular Rate:Normal     Neuro/Psych negative neurological ROS  negative psych ROS   GI/Hepatic negative GI ROS, Neg liver ROS,,,  Endo/Other  diabetes, Poorly Controlled, Type 2, Insulin  Dependent    Renal/GU negative Renal ROS  negative genitourinary   Musculoskeletal negative musculoskeletal ROS (+)    Abdominal   Peds  Hematology negative hematology ROS (+)   Anesthesia Other Findings Repeat C/S, breech presentation, BPP 6/10  Reproductive/Obstetrics (+) Pregnancy                             Anesthesia Physical Anesthesia Plan  ASA: 3  Anesthesia Plan: Spinal   Post-op Pain Management:    Induction:   PONV Risk Score and Plan: Treatment may vary due to age or medical condition  Airway Management Planned: Natural Airway  Additional Equipment:   Intra-op Plan:   Post-operative Plan:   Informed Consent: I have reviewed the patients History and Physical, chart, labs and discussed the procedure including the risks, benefits and alternatives for the proposed anesthesia with the patient or authorized representative who has indicated his/her understanding and acceptance.     Dental advisory given  Plan Discussed with: CRNA  Anesthesia Plan Comments:        Anesthesia Quick Evaluation

## 2024-05-04 NOTE — Anesthesia Procedure Notes (Signed)
 Spinal  Patient location during procedure: OR Start time: 05/04/2024 10:04 AM End time: 05/04/2024 10:06 AM Reason for block: surgical anesthesia Staffing Performed: anesthesiologist  Anesthesiologist: Grace Laura, MD Performed by: Grace Laura, MD Authorized by: Grace Laura, MD   Preanesthetic Checklist Completed: patient identified, IV checked, risks and benefits discussed, surgical consent, monitors and equipment checked, pre-op evaluation and timeout performed Spinal Block Patient position: sitting Prep: DuraPrep and site prepped and draped Patient monitoring: cardiac monitor, continuous pulse ox and blood pressure Approach: midline Location: L3-4 Injection technique: single-shot Needle Needle type: Pencan  Needle gauge: 24 G Needle length: 9 cm Assessment Sensory level: T6 Events: CSF return Additional Notes Functioning IV was confirmed and monitors were applied. Sterile prep and drape, including hand hygiene and sterile gloves were used. The patient was positioned and the spine was prepped. The skin was anesthetized with lidocaine .  Free flow of clear CSF was obtained prior to injecting local anesthetic into the CSF.  The spinal needle aspirated freely following injection.  The needle was carefully withdrawn.  The patient tolerated the procedure well.

## 2024-05-04 NOTE — Discharge Summary (Signed)
 Postpartum Discharge Summary  Date of Service updated***     Patient Name: Chloe Perez DOB: 11-20-1976 MRN: [id number]  Date of admission: 05/03/2024 Delivery date:05/04/2024 Delivering provider: Teena Feast Date of discharge: 05/04/2024  Admitting diagnosis: NST (non-stress test) nonreactive [O28.8] Encounter for supervision of other normal pregnancy in third trimester [Z34.83] Intrauterine pregnancy: [redacted]w[redacted]d     Secondary diagnosis:  Principal Problem:   NST (non-stress test) nonreactive Active Problems:   Pre-existing type 2 diabetes mellitus in pregnancy   Non-English speaking patient   Advanced maternal age in multigravida   History of cesarean delivery   Obesity (BMI 30.0-34.9)   Supervision of high risk pregnancy, antepartum   Chronic hypertension during pregnancy, antepartum   Encounter for supervision of other normal pregnancy in third trimester   Cesarean delivery delivered  Additional problems: ***    Discharge diagnosis: Preterm Pregnancy Delivered, CHTN, and Type 2 DM                                              Post partum procedures:{Postpartum procedures:23558} Augmentation: N/A Complications: {OB Labor/Delivery Complications:20784}  Hospital course: Sceduled C/S   48 y.o. yo G3P0212 at [redacted]w[redacted]d was admitted to the hospital 05/03/2024 for un-scheduled cesarean section with the following indication: Malpresentation and Non-Reassuring FHR. Delivery details are as follows: Patient presented to [facility]U the previous evening reporting abdominal pain and decreased fetal movement. BPP was obtained which was 4/10 with both absent and reversed end diastolic flow on cord dopplers. Subsequently tracing was category I and reactive so prolonged monitoring was done in MAU. Repeat BPP was 8/8 now with only absent end diastolic flow on cord dopplers. Given overall clinical picture with AMA pregnancy, poorly controlled type 2 DM and chronic hypertension, and >34 weeks,  decision made to proceed with delivery. In addition Dr. Arcola Kocher reviewed clinical scenario and agreed with this plan.   Membrane Rupture Time/Date: 10:32 AM,05/04/2024  Delivery Method:C-Section, Low Transverse Operative Delivery:N/A Details of operation can be found in separate operative note.  Patient had a postpartum course complicated by***.  She is ambulating, tolerating a regular diet, passing flatus, and urinating well. Patient is discharged home in stable condition on  05/04/24        Newborn Data: Birth date:05/04/2024 Birth time:10:32 AM Gender:Female Living status:Living Apgars:8 ,9  Weight:3300 g    Magnesium Sulfate received: No BMZ received: No Rhophylac:N/A MMR:N/A T-DaP:{Tdap:23962} Flu: N/A RSV Vaccine received: n/a Transfusion:{Transfusion received:30440034}  Immunizations received: Immunization History  Administered Date(s) Administered   PNEUMOCOCCAL CONJUGATE-20 10/24/2023   Tdap 05/31/2018    Physical exam  Vitals:   05/04/24 0700 05/04/24 0715 05/04/24 0730 05/04/24 0900  BP: (!) 161/77 (!) 152/76 (!) 159/61 138/63  Pulse: 81 78 80 83  Resp:    18  Temp:    98.1 F (36.7 C)  TempSrc:    Oral  SpO2: 97% 97% 97% 98%  Weight:       General: {Exam; general:21111117} Lochia: {Desc; appropriate/inappropriate:30686::"appropriate"} Uterine Fundus: {Desc; firm/soft:30687} Incision: {Exam; incision:21111123} DVT Evaluation: {Exam; dvt:2111122} Labs: Lab Results  Component Value Date   WBC 7.1 05/04/2024   HGB 11.9 (L) 05/04/2024   HCT 35.3 (L) 05/04/2024   MCV 84.7 05/04/2024   PLT 177 05/04/2024      Latest Ref Rng & Units 05/04/2024    2:25 AM  CMP  Glucose  70 - 99 mg/dL 147   BUN 6 - 20 mg/dL 16   Creatinine 8.29 - 1.00 mg/dL 5.62   Sodium 130 - 865 mmol/L 134   Potassium 3.5 - 5.1 mmol/L 3.9   Chloride 98 - 111 mmol/L 103   CO2 22 - 32 mmol/L 19   Calcium  8.9 - 10.3 mg/dL 9.6   Total Protein 6.5 - 8.1 g/dL 6.6   Total Bilirubin 0.0 - 1.2  mg/dL 0.4   Alkaline Phos 38 - 126 U/L 96   AST 15 - 41 U/L 32   ALT 0 - 44 U/L 28    Edinburgh Score:    06/25/2018    8:20 AM  Edinburgh Postnatal Depression Scale Screening Tool  I have been able to laugh and see the funny side of things. 0  I have looked forward with enjoyment to things. 0  I have blamed myself unnecessarily when things went wrong. 0  I have been anxious or worried for no good reason. 0  I have felt scared or panicky for no good reason. 0  Things have been getting on top of me. 0  I have been so unhappy that I have had difficulty sleeping. 0  I have felt sad or miserable. 2  I have been so unhappy that I have been crying. 0  The thought of harming myself has occurred to me. 0  Edinburgh Postnatal Depression Scale Total 2   No data recorded  After visit meds:  Allergies as of 05/04/2024   No Known Allergies   Med Rec must be completed prior to using this [facility]***        Discharge home in stable condition Infant Feeding: {Baby feeding:23562} Infant Disposition:{CHL IP OB HOME WITH HQIONG:29528} Discharge instruction: per After Visit Summary and Postpartum booklet. Activity: Advance as tolerated. Pelvic rest for 6 weeks.  Diet: carb modified diet Future Appointments: Future Appointments  Date Time Provider Department Center  05/07/2024 11:15 AM Derick Fleeting, CNM [facility]  05/13/2024 10:30 AM Ronalee Cocking, MD [facility] None  05/22/2024  8:15 AM Tresia Fruit, MD [facility]  05/28/2024  8:55 AM Abigail Abler, MD [facility]  06/04/2024  8:55 AM Jan Mcgill, MD [facility]  06/11/2024 10:55 AM Lacey Pian, MD [facility]   Follow up Visit:   Please schedule this patient for a In person postpartum visit in 4 weeks with the following provider: MD. Additional Postpartum F/U:Incision check 2-3 days and BP check 1 week  High risk pregnancy complicated by: GDM, HTN, and AMA, preterm delivery at 34 weeks Delivery mode:  C-Section,  Low Transverse Anticipated Birth Control:  Unsure   05/04/2024 Teena Feast, MD

## 2024-05-04 NOTE — Transfer of Care (Signed)
 Immediate Anesthesia Transfer of Care Note  Patient: Chloe Perez  Procedure(s) Performed: CESAREAN DELIVERY  Patient Location: PACU  Anesthesia Type:Spinal  Level of Consciousness: awake, alert , and oriented  Airway & Oxygen Therapy: Patient Spontanous Breathing  Post-op Assessment: Report given to RN and Post -op Vital signs reviewed and stable  Post vital signs: Reviewed and stable  Last Vitals:  Vitals Value Taken Time  BP 126/70 05/04/24 1122  Temp    Pulse 70 05/04/24 1125  Resp 19 05/04/24 1125  SpO2 97 % 05/04/24 1125  Vitals shown include unfiled device data.  Last Pain:  Vitals:   05/04/24 0900  TempSrc: Oral  PainSc:          Complications: No notable events documented.

## 2024-05-04 NOTE — H&P (Deleted)
 LABOR AND DELIVERY ADMISSION HISTORY AND PHYSICAL NOTE  Chloe Perez is a 48 y.o. female [phone] with IUP at [redacted]w[redacted]d presenting to [facility]U for DFM. Found to have non-reactive NST with follow-up 4/10 BPP.  Patient reports the fetal movement as decreased . Patient reports uterine contraction activity as irregular. Patient reports vaginal bleeding as none. Patient describes fluid per vagina as None.   Patient denies headache, vision changes, chest pain, shortness of breath, right upper quadrant pain, or LE edema.  She plans on breast feeding feeding. Her contraception plan is: unsure.  Prenatal History/Complications: PNC at [facility]  Sono:  @[redacted]w[redacted]d , CWD, normal anatomy, cephalic presentation, anterior placenta, >99%ile, EFW 2696 g  Pregnancy complications:  Patient Active Problem List   Diagnosis Date Noted   NST (non-stress test) nonreactive 05/04/2024   Uncontrolled diabetes mellitus with hyperglycemia (HCC) 04/04/2024   Chronic hypertension during pregnancy, antepartum 01/18/2024   Poor compliance 01/08/2024   Supervision of high risk pregnancy, antepartum 12/19/2023   Heart murmur, systolic 10/24/2023   Obesity (BMI 30.0-34.9) 10/24/2023   History of chest pain 10/24/2023   History of cesarean delivery 05/29/2018   Non-English speaking patient 02/14/2018   Advanced maternal age in multigravida 02/14/2018   Pre-existing type 2 diabetes mellitus in pregnancy 02/08/2017   Type II diabetes mellitus (HCC) 06/03/2012    Past Medical History: Past Medical History:  Diagnosis Date   Diabetes mellitus without complication (HCC)    DM type 2 metformin     Female hypogonadism 02/08/2017   Heart pain    Hyperlipidemia associated with type 2 diabetes mellitus (HCC) 10/24/2023   Missed AB    PCOS (polycystic ovarian syndrome) 02/08/2017    Past Surgical History: Past Surgical History:  Procedure Laterality Date   CESAREAN SECTION N/A 05/28/2018   Procedure: CESAREAN  SECTION;  Surgeon: Malka Sea, DO;  Location: [facility];  Service: Obstetrics;  Laterality: N/A;   Uterine polyps     Had two different surgeris to remove--last 7 years ago.    Obstetrical History: OB History     Gravida  3   Para  1   Term      Preterm  1   AB  1   Living  1      SAB  1   IAB      Ectopic      Multiple      Live Births  1           Social History: Social History   Socioeconomic History   Marital status: Married    Spouse name: Fortunato Ill   Number of children: 1   Years of education: 9   Highest education level: Not on file  Occupational History   Occupation: Hair stylist  Tobacco Use   Smoking status: Former    Current packs/day: 0.00    Types: Cigarettes    Quit date: 09/28/2023    Years since quitting: 0.6   Smokeless tobacco: Never   Tobacco comments:    Discussed nicotine gum and how to use.  Vaping Use   Vaping status: Never Used  Substance and Sexual Activity   Alcohol  use: Not Currently    Comment: sometimes   Drug use: No   Sexual activity: Yes    Birth control/protection: None  Other Topics Concern   Not on file  Social History Narrative   Lives at home with long time female partner and their son.     Social Drivers  of Health   Financial Resource Strain: Low Risk  (10/24/2023)   Overall Financial Resource Strain (CARDIA)    Difficulty of Paying Living Expenses: Not hard at all  Food Insecurity: No Food Insecurity (04/12/2024)   Hunger Vital Sign    Worried About Running Out of Food in the Last Year: Never true    Ran Out of Food in the Last Year: Never true  Transportation Needs: No Transportation Needs (04/12/2024)   PRAPARE - Administrator, Civil Service (Medical): No    Lack of Transportation (Non-Medical): No  Physical Activity: Not on file  Stress: Not on file  Social Connections: Unknown (04/12/2024)   Social Connection and Isolation Panel [NHANES]    Frequency of Communication  with Friends and Family: Never    Frequency of Social Gatherings with Friends and Family: Not on file    Attends Religious Services: Not on Marketing executive or Organizations: Not on file    Attends Banker Meetings: Not on file    Marital Status: Not on file    Family History: Family History  Problem Relation Age of Onset   Hypertension Mother    Diabetes Mother    Heart disease Father     Allergies: No Known Allergies  Medications Prior to Admission  Medication Sig Dispense Refill Last Dose/Taking   aspirin  EC 81 MG tablet Take 1 tablet (81 mg total) by mouth daily. Swallow whole. 60 tablet 1 05/03/2024   insulin  lispro (HUMALOG ) 100 UNIT/ML injection Inject 0.28 mLs (28 Units total) into the skin 3 (three) times daily with meals. 10 mL 11 05/04/2024 Morning   insulin  NPH Human (NOVOLIN  N) 100 UNIT/ML injection Inject 0.38 mLs (38 Units total) into the skin 2 (two) times daily at 8 am and 10 pm. 10 mL 11 05/04/2024 Morning   metFORMIN  (GLUCOPHAGE -XR) 500 MG 24 hr tablet 2 tabs by mouth twice daily. 120 tablet 11 05/04/2024 Morning   Alcohol  Swabs (ALCOHOL  PADS) 70 % PADS 1 Pad by Does not apply route 4 (four) times daily. 100 each 2    Blood Pressure Monitoring (OMRON 3 SERIES BP MONITOR) DEVI Use to take blood pressure as needed 1 each 0    Continuous Glucose Sensor (FREESTYLE LIBRE 3 SENSOR) MISC 1 Device by Does not apply route as directed. Place 1 sensor on the skin every 14 days. Use to check glucose continuously 2 each 2    Insulin  Pen Needle 31G X 6 MM MISC 1 Units by Does not apply route 4 (four) times daily. 100 each 6    Insulin  Syringe-Needle U-100 30G X 1/2" 0.3 ML MISC Use 1 syringe to inject insulin  three times a day. 100 each 0    labetalol  (NORMODYNE ) 300 MG tablet Take 1 tablet (300 mg total) by mouth 3 (three) times daily. 120 tablet 3    nitroGLYCERIN  (NITROSTAT ) 0.4 MG SL tablet 1 tab under tongue as needed for chest pain may repeat every 5  minutes times 2 if pain not relieved (Patient not taking: Reported on 04/18/2024) 25 tablet 1    prenatal vitamin w/FE, FA (PRENATAL 1 + 1) 27-1 MG TABS tablet Take 1 tablet by mouth daily. 30 tablet 11      Review of Systems  All systems reviewed and negative except as stated in HPI  Physical Exam BP (!) 126/51   Pulse 81   Temp 97.8 F (36.6 C) (Oral)   Resp 16  Wt 79.2 kg   LMP 08/13/2023 (Approximate)   SpO2 98%   BMI 34.12 kg/m   Physical Exam Constitutional:      General: She is not in acute distress.    Appearance: She is not ill-appearing.  Cardiovascular:     Rate and Rhythm: Normal rate.  Abdominal:     General: A surgical scar is present.     Comments: Gravid  Skin:    General: Skin is warm and dry.  Neurological:     General: No focal deficit present.  Psychiatric:        Mood and Affect: Mood normal.   Cat II tracing with minimal to moderate variability, no accels, shallow variables  Prenatal labs: ABO, Rh: --/--/PENDING (06/08 0225) Antibody: PENDING (06/08 0225) Rubella:   RPR:    HBsAg:    HIV:    GC/Chlamydia:  Neisseria Gonorrhea  Date Value Ref Range Status  01/07/2024 Negative  Final   Chlamydia  Date Value Ref Range Status  01/07/2024 Negative  Final   GBS:  unknown  Prenatal Transfer Tool  Maternal Diabetes: Yes:  Diabetes Type:  Pre-pregnancy Genetic Screening: Normal Maternal Ultrasounds/Referrals: Normal Fetal Ultrasounds or other Referrals:  Fetal echo, Referred to Materal Fetal Medicine  Maternal Substance Abuse:  No Significant Maternal Medications:  Meds include: Other:  metformin , insulin , labetalol  Significant Maternal Lab Results: GBS unknown  Results for orders placed or performed during the hospital encounter of 05/03/24 (from the past 24 hours)  Protein / creatinine ratio, urine   Collection Time: 05/04/24 12:19 AM  Result Value Ref Range   Creatinine, Urine 23 mg/dL   Total Protein, Urine <6 mg/dL   Protein  Creatinine Ratio        0.00 - 0.15 mg/mg[Cre]  Glucose, capillary   Collection Time: 05/04/24  1:54 AM  Result Value Ref Range   Glucose-Capillary 132 (H) 70 - 99 mg/dL  Type and screen [facility]   Collection Time: 05/04/24  2:25 AM  Result Value Ref Range   ABO/RH(D) PENDING    Antibody Screen PENDING    Sample Expiration      05/07/2024,2359 Performed at [facility], [address] [place], [place] [address]     Assessment: Chloe Perez is a 48 y.o. [phone] at [redacted]w[redacted]d here for unscheduled, urgent cesarean section.  #Repeat Low Transverse Cesarean  The risks of cesarean section discussed with the patient included but were not limited to: bleeding which may require transfusion or reoperation; infection which may require antibiotics; injury to bowel, bladder, ureters or other surrounding organs; injury to the fetus; need for additional procedures including hysterectomy in the event of a life-threatening hemorrhage; placental abnormalities with subsequent pregnancies, incisional problems, thromboembolic phenomenon and other postoperative/anesthesia complications. The patient concurred with the proposed plan, giving informed written consent for the procedure. Patient NPO status waived given urgency of case. Anesthesia and OR aware. Preoperative prophylactic antibiotics and SCDs ordered on call to the OR.   #Anesthesia: spinal #FWB: BPP 4/10 #GBS/ID: Unknown #MOF: breast feeding #MOC: oral contraceptives (estrogen/progesterone ) #Circ: No  #T2DM: Plan to resume pre-pregnancy regimen postpartum #cHTN: Continue labetalol  300 mg TID. Lasix/K PP  Patient's preferred language is [place]. Stratus translator used during patient interaction.   Maud Sorenson, MD [facility] [place], [facility]  05/04/2024, 2:46 AM

## 2024-05-04 NOTE — MAU Provider Note (Addendum)
 MAU Provider Note  Chief Complaint: Abdominal Pain and Decreased Fetal Movement  SUBJECTIVE HPI: Edee Nifong is a 48 y.o. G2X5284 at [redacted]w[redacted]d by 13 week ultrasound who presents to maternity admissions reporting DFM. Pregnancy c/b cHTN, pre-existing T2DM (uncontrolled), AMA, LGA. Receives [facility] with [facility].  Patient presents with DFM since this evening. Feeling some ?contractions as well. Denies LOF, VB, change in discharge, urinary symptoms. Took insulin  at dinnertime.   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   Social History   Socioeconomic History   Marital status: Married    Spouse name: Fortunato Ill   Number of children: 1   Years of education: 9   Highest education level: Not on file  Occupational History   Occupation: Hair stylist  Tobacco Use   Smoking status: Former    Current packs/day: 0.00    Types: Cigarettes    Quit date: 09/28/2023    Years since quitting: 0.6   Smokeless tobacco: Never   Tobacco comments:    Discussed nicotine gum and how to use.  Vaping Use   Vaping status: Never Used  Substance and Sexual Activity   Alcohol  use: Not Currently    Comment: sometimes   Drug use: No   Sexual activity: Yes    Birth control/protection: None  Other Topics Concern   Not on file  Social History Narrative   Lives at home with long time female partner and their son.     Social Drivers of Manufacturing engineer Strain: Low Risk  (10/24/2023)   Overall Financial Resource Strain (CARDIA)    Difficulty of Paying Living Expenses: Not hard at all  Food Insecurity: No Food Insecurity (04/12/2024)   Hunger Vital Sign    Worried About Running Out of Food in the Last Year: Never true    Ran Out of Food in the Last Year: Never true  Transportation Needs: No Transportation Needs (04/12/2024)   PRAPARE - Administrator, Civil Service (Medical): No    Lack of Transportation (Non-Medical): No  Physical Activity: Not on file  Stress: Not on file  Social Connections: Unknown (04/12/2024)   Social Connection and Isolation Panel [NHANES]    Frequency of Communication with Friends and Family: Never    Frequency of Social Gatherings with Friends and Family: Not on file    Attends Religious Services: Not on file    Active Member of Clubs or Organizations: Not on file    Attends Banker Meetings: Not on file    Marital Status: Not on file  Intimate Partner Violence: Not At Risk (04/12/2024)   Humiliation, Afraid, Rape, and Kick questionnaire    Fear of Current or Ex-Partner: No    Emotionally Abused: No    Physically Abused: No    Sexually Abused: No   No current facility-administered medications on file prior to encounter.  Current Outpatient Medications on File Prior to Encounter  Medication Sig Dispense Refill   aspirin  EC 81 MG tablet Take 1 tablet (81 mg total) by mouth daily. Swallow whole. 60 tablet 1   insulin  lispro (HUMALOG ) 100 UNIT/ML injection Inject 0.28 mLs (28 Units total) into the skin 3 (three) times daily with meals. 10 mL 11   insulin  NPH Human (NOVOLIN  N) 100 UNIT/ML injection Inject 0.38 mLs (38 Units total) into the skin 2 (two) times daily at 8 am and 10 pm. 10 mL 11   metFORMIN  (GLUCOPHAGE -XR) 500 MG 24 hr tablet 2 tabs by mouth twice daily. 120 tablet 11   Alcohol  Swabs (ALCOHOL  PADS) 70 % PADS 1 Pad by Does not apply route 4 (four) times  daily. 100 each 2   Blood Pressure Monitoring (OMRON 3 SERIES BP MONITOR) DEVI Use to take blood pressure as needed 1 each 0   Continuous Glucose Sensor (FREESTYLE LIBRE 3 SENSOR) MISC 1 Device by Does not apply route as directed. Place 1 sensor on the skin every 14 days. Use to check glucose continuously 2 each 2   Insulin  Pen Needle 31G X 6 MM MISC 1 Units by Does not apply route 4 (four) times daily. 100 each 6   Insulin  Syringe-Needle U-100 30G X 1/2" 0.3 ML MISC Use 1 syringe to inject insulin  three times a day. 100 each 0   labetalol  (NORMODYNE ) 300 MG tablet Take 1 tablet (300 mg total) by mouth 3 (three) times daily. 120 tablet 3   nitroGLYCERIN  (NITROSTAT ) 0.4 MG SL tablet 1 tab under tongue as needed for chest pain may repeat every 5 minutes times 2 if pain not relieved (Patient not taking: Reported on 04/18/2024) 25 tablet 1   prenatal vitamin w/FE, FA (PRENATAL 1 + 1) 27-1 MG TABS tablet Take 1 tablet by mouth daily. 30 tablet 11   [DISCONTINUED] insulin  aspart (NOVOLOG ) 100 UNIT/ML injection Inject 28 Units into the skin 3 (three) times daily with meals. 10 mL 11   No Known Allergies  ROS:  Pertinent positives/negatives listed above.  I have reviewed patient's Past Medical Hx, Surgical Hx, Family Hx, Social Hx, medications and allergies.   Physical Exam  Patient Vitals for the past 24 hrs:  BP Temp Temp src Pulse Resp SpO2 Weight  05/04/24 0200 (!) 126/51 -- -- 81 -- 98 % --  05/04/24 0031 132/67 -- -- 78 -- -- --  05/04/24 0019 (!) 153/73 -- -- 77 -- -- --  05/04/24 0012 (!) 161/73 97.8 F (36.6 C) Oral 75 16 99 % 79.2 kg   Constitutional: Well-developed, well-nourished female in no acute distress  Cardiovascular: normal rate Respiratory: normal effort GI: Abd soft, non-tender MS: Extremities nontender, no edema, normal ROM Neurologic: Alert and oriented x 4  GU: Neg CVAT  FHT:  Baseline 145, minimal to moderate variability, no accelerations present, shallow variables  decelerations Contractions: UI  LAB RESULTS Results for orders placed or performed during the hospital encounter of 05/03/24 (from the past 24 hours)  Protein / creatinine ratio, urine     Status: None   Collection Time: 05/04/24 12:19 AM  Result Value Ref Range   Creatinine, Urine 23 mg/dL   Total Protein, Urine <6 mg/dL   Protein Creatinine Ratio        0.00 - 0.15 mg/mg[Cre]  Glucose, capillary     Status: Abnormal   Collection Time: 05/04/24  1:54 AM  Result Value Ref Range   Glucose-Capillary 132 (H)  70 - 99 mg/dL  CBC     Status: None   Collection Time: 05/04/24  2:25 AM  Result Value Ref Range   WBC 7.9 4.0 - 10.5 K/uL   RBC 4.48 3.87 - 5.11 MIL/uL   Hemoglobin 12.5 12.0 - 15.0 g/dL   HCT 40.9 81.1 - 91.4 %   MCV 85.0 80.0 - 100.0 fL   MCH 27.9 26.0 - 34.0 pg   MCHC 32.8 30.0 - 36.0 g/dL   RDW 78.2 95.6 - 21.3 %   Platelets 195 150 - 400 K/uL   nRBC 0.0 0.0 - 0.2 %  Comprehensive metabolic panel     Status: Abnormal   Collection Time: 05/04/24  2:25 AM  Result Value Ref Range   Sodium 134 (L) 135 - 145 mmol/L   Potassium 3.9 3.5 - 5.1 mmol/L   Chloride 103 98 - 111 mmol/L   CO2 19 (L) 22 - 32 mmol/L   Glucose, Bld 124 (H) 70 - 99 mg/dL   BUN 16 6 - 20 mg/dL   Creatinine, Ser 0.86 0.44 - 1.00 mg/dL   Calcium  9.6 8.9 - 10.3 mg/dL   Total Protein 6.6 6.5 - 8.1 g/dL   Albumin 3.0 (L) 3.5 - 5.0 g/dL   AST 32 15 - 41 U/L   ALT 28 0 - 44 U/L   Alkaline Phosphatase 96 38 - 126 U/L   Total Bilirubin 0.4 0.0 - 1.2 mg/dL   GFR, Estimated >57 >84 mL/min   Anion gap 12 5 - 15  Type and screen [facility]     Status: None   Collection Time: 05/04/24  2:25 AM  Result Value Ref Range   ABO/RH(D) B POS    Antibody Screen NEG    Sample Expiration      05/07/2024,2359 Performed at [facility], [address] [place], [place] [address]     --/--/B POS (06/08 0225)  IMAGING US  MFM FETAL BPP W/NONSTRESS Result Date:  04/25/2024 ----------------------------------------------------------------------  OBSTETRICS REPORT                       (Signed Final 04/25/2024 02:04 pm) ---------------------------------------------------------------------- Patient Info  ID #:       [id number]                          D.O.B.:  14-Sep-1976 (48 yrs)(F)  Name:       Raiford Bunnell PAZ                 Visit Date: 04/25/2024 11:45 am              RAMIREZ-ALVARADO ---------------------------------------------------------------------- Performed By  Attending:        Cassandria Clever MD        Ref. Address:     [address]                                                             [place], [place]  [address]  Performed By:     Marybeth Smock         [facility]                                  [facility]  Referred By:      [facility] ---------------------------------------------------------------------- Orders  #  Description                           Code        Ordered By  1  US  MFM FETAL BPP                      57846.9     Elgin Grit     W/NONSTRESS ----------------------------------------------------------------------  #  Order #                     Accession #                Episode #  1  [id number]                   [id number]                 [id number] ---------------------------------------------------------------------- Indications  [redacted] weeks gestation of pregnancy                Z3A.33  Pre-existing diabetes, type 2, in pregnancy,   O24.113  third trimester  Hypertension - Chronic/Pre-                    O10.019  existing(Labetalol )  Advanced maternal age multigravida 1+,        O23.523  third trimester  Large for gestational age fetus affecting      O36.60X0  management of  mother  Encounter for other antenatal screening        Z36.2  follow-up ---------------------------------------------------------------------- Fetal Evaluation  Num Of Fetuses:         1  Fetal Heart Rate(bpm):  158  Cardiac Activity:       Observed  Presentation:           Cephalic  Placenta:               Anterior  P. Cord Insertion:      Previously seen  Amniotic Fluid  AFI  FV:      Within normal limits  AFI Sum(cm)     %Tile       Largest Pocket(cm)  13.93           48          5.66  RUQ(cm)       RLQ(cm)       LUQ(cm)        LLQ(cm)  5.66          3.28          0              4.99 ---------------------------------------------------------------------- Biophysical Evaluation  Amniotic F.V:   Within normal limits       F. Tone:        Observed  F. Movement:    Observed                   N.S.T:          Nonreactive  F. Breathing:   Observed                   Score:          8/10 ---------------------------------------------------------------------- Biometry  LV:        5.1  mm ---------------------------------------------------------------------- OB History  Gravidity:    3         Term:   1         SAB:   1  Living:       1 ---------------------------------------------------------------------- Gestational Age  LMP:           36w 4d        Date:  08/13/23                  EDD:   05/19/24  Best:          33w 4d     Det. By:  Delphine Fiedler         EDD:   06/09/24                                      (12/04/23) ---------------------------------------------------------------------- Anatomy  Ventricles:            Appears normal         Stomach:                Appears normal, left                                                                        sided  Heart:                 Appears normal         Kidneys:                Appear normal                         (4CH, axis, and                         situs)  Diaphragm:  Appears normal         Bladder:                Appears normal  ---------------------------------------------------------------------- Impression  Nonreactive NST.  Amniotic fluid is normal good fetal activity seen.  Antenatal  testing is reassuring.  BPP 8/10.  I reassured the patient of the findings. ---------------------------------------------------------------------- Recommendations  -Continue weekly antenatal testing till delivery. ----------------------------------------------------------------------                 Cassandria Clever, MD Electronically Signed Final Report   04/25/2024 02:04 pm ----------------------------------------------------------------------   US  MFM FETAL BPP WO NON STRESS Result Date: 04/18/2024 ----------------------------------------------------------------------  OBSTETRICS REPORT                       (Signed Final 04/18/2024 09:50 am) ---------------------------------------------------------------------- Patient Info  ID #:       [id number]                          D.O.B.:  1976-07-13 (48 yrs)(F)  Name:       Raiford Bunnell PAZ                 Visit Date: 04/18/2024 08:29 am              RAMIREZ-ALVARADO ---------------------------------------------------------------------- Performed By  Attending:        Cassandria Clever MD        Ref. Address:     [address]                                                             [place], [place]                                                             [address]  Performed By:     Fredrick Jenkins          Location[facility]                    RD[facility]                                     [facility]  Referred  By:      [facility] ---------------------------------------------------------------------- Orders  #  Description                           Code        Ordered By  1  US  MFM FETAL BPP WO NON               76819.01    RAVI  SHANKAR     STRESS  2  US  MFM OB FOLLOW UP                   [id number]    RAVI SHANKAR ----------------------------------------------------------------------  #  Order #                     Accession #                Episode #  1  [id number]                   [id number]                 [id number]  2  [id number]                   [id number]                 [id number] ---------------------------------------------------------------------- Indications  Pre-existing diabetes, type 2, in pregnancy,   O24.113  third trimester  Hypertension - Chronic/Pre-                    O10.019  existing(Labetalol )  Advanced maternal age multigravida 26+,        O51.523  third trimester  Large for gestational age fetus affecting      O36.60X0  management of mother  [redacted] weeks gestation of pregnancy                Z3A.32  Encounter for other antenatal screening        Z36.2  follow-up ---------------------------------------------------------------------- Fetal Evaluation  Num Of Fetuses:         1  Fetal Heart Rate(bpm):  157  Cardiac Activity:       Observed  Placenta:               Anterior  P. Cord Insertion:      Previously seen  AFI Sum(cm)     %Tile       Largest Pocket(cm)  23.39           91          6.91  RUQ(cm)       RLQ(cm)       LUQ(cm)        LLQ(cm)  6.53          3.95          6.91           6 ---------------------------------------------------------------------- Biophysical Evaluation  Amniotic F.V:   Pocket => 2 cm             F. Tone:        Observed  F. Movement:    Observed                   Score:  8/8  F. Breathing:   Observed ---------------------------------------------------------------------- Biometry  BPD:      83.3  mm     G. Age:  33w 4d         71  %    CI:        81.42   %    70 - 86                                                          FL/HC:      22.0   %    19.9 - 21.5  HC:      291.4  mm     G. Age:  32w 1d          8  %    HC/AC:      0.86        0.96 - 1.11  AC:      338.1  mm     G. Age:  37w 5d        > 99  %    FL/BPD:     77.1   %    71 - 87  FL:       64.2  mm     G. Age:  33w 1d         54  %    FL/AC:      19.0   %    20 - 24  Est. FW:    2696  gm    5 lb 15 oz    > 99  % ---------------------------------------------------------------------- OB History  Gravidity:    3         Term:   1         SAB:   1  Living:       1 ---------------------------------------------------------------------- Gestational Age  LMP:           35w 4d        Date:  08/13/23                  EDD:   05/19/24  U/S Today:     34w 1d                                        EDD:   05/29/24  Best:          32w 4d     Det. ByDelphine Fiedler         EDD:   06/09/24                                      (12/04/23) ---------------------------------------------------------------------- Anatomy  Diaphragm:             Appears normal         Kidneys:                Appear normal  Stomach:               Appears normal, left   Bladder:  Appears normal                         sided ---------------------------------------------------------------------- Cervix Uterus Adnexa  Cervix  Not visualized (advanced GA >24wks) ---------------------------------------------------------------------- Impression  G2 P0101 at 32w 4d gestation.  - Type 2 diabetes.  Patient takes insulin  NPH 38 units in the  morning and 38 units at night.  She takes NovoLog  28/28/28  units with meals.  She takes metformin  500 mg twice daily.  She reports her fasting levels are between 84 and 98 mg/dL,  and postprandial levels are between 110 and 131 mg/dL.  Patient was admitted 2 weeks ago for control of diabetes and  was discharged after 5 days of inpatient management.  Recent hemoglobin A1c was 8.8%.  - Chronic hypertension.  Patient takes labetalol  and her blood  pressures are well-controlled.  Blood pressure today at our  office is 127/71 mmHg.  Ultrasound  The estimated fetal weight and the abdominal circumference  measurements are at the 99th percentiles.   Amniotic fluid is  normal good fetal activity seen.  Cephalic presentation.  Antenatal testing is reassuring.  BPP 8/8.  No evidence of placenta previa or placenta accreta spectrum.  I discussed the finding of large for gestational age fetus that  reflects previous poor control.  I emphasized the importance  of good blood glucose control to prevent fetal and neonatal  adverse outcomes.  Ultrasound has limitations in accurately  estimating fetal weights.  I discussed ultrasound protocol of weekly antenatal testing.  Given that she has comorbid conditions of chronic  hypertension and diabetes that required inpatient  management, delivery at [redacted] weeks gestation is reasonable.  Patient is undecided about mode of delivery. ---------------------------------------------------------------------- Recommendations  - Continue weekly antenatal testing till delivery.  - Fetal growth assessment in 4 weeks.  - Consider delivery at [redacted] weeks gestation. ----------------------------------------------------------------------                 Cassandria Clever, MD Electronically Signed Final Report   04/18/2024 09:50 am ----------------------------------------------------------------------   US  MFM OB FOLLOW UP Result Date: 04/18/2024 ----------------------------------------------------------------------  OBSTETRICS REPORT                       (Signed Final 04/18/2024 09:50 am) ---------------------------------------------------------------------- Patient Info  ID #:       [id number]                          D.O.B.:  1976/02/01 (48 yrs)(F)  Name:       Raiford Bunnell PAZ                 Visit Date: 04/18/2024 08:29 am              RAMIREZ-ALVARADO ---------------------------------------------------------------------- Performed By  Attending:        Cassandria Clever MD        Ref. Address:     [address]                                                             [place], [place]  [address]   Performed By:     Fredrick Jenkins          Location[facility]                                     [facility]  Referred By:      [facility] ---------------------------------------------------------------------- Orders  #  Description                           Code        Ordered By  1  US  MFM FETAL BPP WO NON               76819.01    RAVI SHANKAR     STRESS  2  US  MFM OB FOLLOW UP                   [id number]    RAVI [facility] ----------------------------------------------------------------------  #  Order #                     Accession #                Episode #  1  [id number]                   [id number]                 [id number]  2  [id number]                   [id number]                 [id number] ---------------------------------------------------------------------- Indications  Pre-existing diabetes, type 2, in pregnancy,   O24.113  third trimester  Hypertension - Chronic/Pre-                    O10.019  existing(Labetalol )  Advanced maternal age multigravida 43+,        O61.523  third trimester  Large for gestational age fetus affecting      O36.60X0  management of mother  [redacted] weeks gestation of pregnancy                Z3A.32  Encounter for other antenatal screening        Z36.2  follow-up ---------------------------------------------------------------------- Fetal Evaluation  Num Of Fetuses:  1  Fetal Heart Rate(bpm):  157  Cardiac Activity:       Observed  Placenta:               Anterior  P. Cord Insertion:      Previously seen  AFI Sum(cm)     %Tile       Largest Pocket(cm)  23.39           91          6.91  RUQ(cm)       RLQ(cm)       LUQ(cm)        LLQ(cm)  6.53          3.95          6.91           6 ---------------------------------------------------------------------- Biophysical  Evaluation  Amniotic F.V:   Pocket => 2 cm             F. Tone:        Observed  F. Movement:    Observed                   Score:          8/8  F. Breathing:   Observed ---------------------------------------------------------------------- Biometry  BPD:      83.3  mm     G. Age:  33w 4d         71  %    CI:        81.42   %    70 - 86                                                          FL/HC:      22.0   %    19.9 - 21.5  HC:      291.4  mm     G. Age:  32w 1d          8  %    HC/AC:      0.86        0.96 - 1.11  AC:      338.1  mm     G. Age:  37w 5d       > 99  %    FL/BPD:     77.1   %    71 - 87  FL:       64.2  mm     G. Age:  33w 1d         54  %    FL/AC:      19.0   %    20 - 24  Est. FW:    2696  gm    5 lb 15 oz    > 99  % ---------------------------------------------------------------------- OB History  Gravidity:    3         Term:   1         SAB:   1  Living:       1 ---------------------------------------------------------------------- Gestational Age  LMP:           35w 4d        Date:  08/13/23  EDD:   05/19/24  U/S Today:     34w 1d                                        EDD:   05/29/24  Best:          32w 4d     Det. By:  Delphine Fiedler         EDD:   06/09/24                                      (12/04/23) ---------------------------------------------------------------------- Anatomy  Diaphragm:             Appears normal         Kidneys:                Appear normal  Stomach:               Appears normal, left   Bladder:                Appears normal                         sided ---------------------------------------------------------------------- Cervix Uterus Adnexa  Cervix  Not visualized (advanced GA >24wks) ---------------------------------------------------------------------- Impression  G2 P0101 at 32w 4d gestation.  - Type 2 diabetes.  Patient takes insulin  NPH 38 units in the  morning and 38 units at night.  She takes NovoLog  28/28/28  units with meals.  She takes  metformin  500 mg twice daily.  She reports her fasting levels are between 84 and 98 mg/dL,  and postprandial levels are between 110 and 131 mg/dL.  Patient was admitted 2 weeks ago for control of diabetes and  was discharged after 5 days of inpatient management.  Recent hemoglobin A1c was 8.8%.  - Chronic hypertension.  Patient takes labetalol  and her blood  pressures are well-controlled.  Blood pressure today at our  office is 127/71 mmHg.  Ultrasound  The estimated fetal weight and the abdominal circumference  measurements are at the 99th percentiles.  Amniotic fluid is  normal good fetal activity seen.  Cephalic presentation.  Antenatal testing is reassuring.  BPP 8/8.  No evidence of placenta previa or placenta accreta spectrum.  I discussed the finding of large for gestational age fetus that  reflects previous poor control.  I emphasized the importance  of good blood glucose control to prevent fetal and neonatal  adverse outcomes.  Ultrasound has limitations in accurately  estimating fetal weights.  I discussed ultrasound protocol of weekly antenatal testing.  Given that she has comorbid conditions of chronic  hypertension and diabetes that required inpatient  management, delivery at [redacted] weeks gestation is reasonable.  Patient is undecided about mode of delivery. ---------------------------------------------------------------------- Recommendations  - Continue weekly antenatal testing till delivery.  - Fetal growth assessment in 4 weeks.  - Consider delivery at [redacted] weeks gestation. ----------------------------------------------------------------------                 Cassandria Clever, MD Electronically Signed Final Report   04/18/2024 09:50 am ----------------------------------------------------------------------   VAS US  LOWER EXTREMITY VENOUS (DVT) Result Date: 04/08/2024  Lower Venous DVT Study Patient Name:  NAKEETA SEBASTIANI  Date of Exam:   04/08/2024 Medical Rec #: [id number]  Accession #:     [id number] Date of Birth: 1976/02/28               Patient Gender: F Patient Age:   15 years Exam Location:  [facility] Procedure:      VAS US  LOWER EXTREMITY VENOUS (DVT) Referring Phys: Bertell Broach PRATT --------------------------------------------------------------------------------  Indications: Swelling.  Risk Factors: None identified. Comparison Study: No prior studies. Performing Technologist: Lerry Ransom RVT  Examination Guidelines: A complete evaluation includes B-mode imaging, spectral Doppler, color Doppler, and power Doppler as needed of all accessible portions of each vessel. Bilateral testing is considered an integral part of a complete examination. Limited examinations for reoccurring indications may be performed as noted. The reflux portion of the exam is performed with the patient in reverse Trendelenburg.  +---------+---------------+---------+-----------+----------+--------------+ RIGHT    CompressibilityPhasicitySpontaneityPropertiesThrombus Aging +---------+---------------+---------+-----------+----------+--------------+ CFV      Full           Yes      Yes                                 +---------+---------------+---------+-----------+----------+--------------+ SFJ      Full                                                        +---------+---------------+---------+-----------+----------+--------------+ FV Prox  Full                                                        +---------+---------------+---------+-----------+----------+--------------+ FV Mid   Full                                                        +---------+---------------+---------+-----------+----------+--------------+ FV DistalFull                                                        +---------+---------------+---------+-----------+----------+--------------+ PFV      Full                                                         +---------+---------------+---------+-----------+----------+--------------+ POP      Full           Yes      Yes                                 +---------+---------------+---------+-----------+----------+--------------+ PTV      Full                                                        +---------+---------------+---------+-----------+----------+--------------+  PERO     Full                                                        +---------+---------------+---------+-----------+----------+--------------+   +----+---------------+---------+-----------+----------+--------------+ LEFTCompressibilityPhasicitySpontaneityPropertiesThrombus Aging +----+---------------+---------+-----------+----------+--------------+ CFV Full           Yes      Yes                                 +----+---------------+---------+-----------+----------+--------------+     Summary: RIGHT: - There is no evidence of deep vein thrombosis in the lower extremity.  - No cystic structure found in the popliteal fossa.  LEFT: - No evidence of common femoral vein obstruction.   *See table(s) above for measurements and observations. Electronically signed by Delaney Fearing on 04/08/2024 at 4:23:34 PM.    Final    US  MFM OB FOLLOW UP Result Date: 04/04/2024 ----------------------------------------------------------------------  OBSTETRICS REPORT                       (Signed Final 04/04/2024 04:39 pm) ---------------------------------------------------------------------- Patient Info  ID #:       [id number]                          D.O.B.:  [birth year] (48 yrs)(F)  Name:       [name]-                  Visit Date: 04/04/2024 12:46 pm              ALVARADO ---------------------------------------------------------------------- Performed By  Attending:        Penney Bowling DO       Ref. Address:     [facility]  Performed By:     Lonny Robertson       [facility]  Referred By:      Lacey Pian                    MD ---------------------------------------------------------------------- Orders  #  Description                           Code        Ordered By  1  US  MFM OB FOLLOW UP                   02725.36    Lacey Pian ----------------------------------------------------------------------  #  Order #                     Accession #                Episode #  1  [id number]                   [id number]  [id number] ---------------------------------------------------------------------- Indications  Pre-existing diabetes, type 2, in pregnancy,   O24.113  third trimester  Hypertension - Chronic/Pre-                    O10.019  existing(Labetalol )  Advanced maternal age multigravida 32+,        O75.523  third trimester  Large for gestational age fetus affecting      O36.60X0  management of mother  [redacted] weeks gestation of pregnancy                Z3A.30 ---------------------------------------------------------------------- Fetal Evaluation  Num Of Fetuses:         1  Fetal Heart Rate(bpm):  145  Cardiac Activity:       Observed  Presentation:           Breech  Placenta:               Anterior  P. Cord Insertion:      Visualized, central  Amniotic Fluid  AFI FV:      Within normal limits  AFI Sum(cm)     %Tile       Largest Pocket(cm)  13.7            44          5.7                RLQ(cm)       LUQ(cm)        LLQ(cm)                5.7           3.4            4.6 ---------------------------------------------------------------------- Biometry  BPD:      78.7  mm     G. Age:  31w 4d         70  %    CI:        74.37   %    70 - 86                                                          FL/HC:      20.4   %    19.3 - 21.3  HC:      289.7  mm     G. Age:  31w 6d         52  %    HC/AC:      0.94        0.96 - 1.17  AC:      307.7  mm     G. Age:  34w 5d       > 99  %    FL/BPD:     75.2   %    71 - 87  FL:       59.2  mm     G. Age:  30w 6d          44  %    FL/AC:      19.2   %    20 - 24  Est. FW:    2110  gm    4 lb 10 oz      98  % ---------------------------------------------------------------------- OB History  Gravidity:  3         Term:   1         SAB:   1  Living:       1 ---------------------------------------------------------------------- Gestational Age  LMP:           33w 4d        Date:  08/13/23                  EDD:   05/19/24  U/S Today:     32w 2d                                        EDD:   05/28/24  Best:          30w 4d     Det. By:  Delphine Fiedler         EDD:   06/09/24                                      (12/04/23) ---------------------------------------------------------------------- Anatomy  Ventricles:            Appears normal         Stomach:                Appears normal, left                                                                        sided  Heart:                 Not well visualized    Kidneys:                Not well visualized  Diaphragm:             Appears normal         Bladder:                Appears normal ---------------------------------------------------------------------- Comments  Hospital Ultrasound  The patient is admitted for diabetic management.  Sonographic findings  Single intrauterine pregnancy at 30w 4d  Fetal cardiac activity: Observed and appears normal.  Presentation: Breech.  Limited fetal anatomy appears normal.  EFW is at the 99th percentile.  Amniotic fluid volume: Within normal limits. MVP: 5.7 cm.  Placenta: Anterior.  Recommendations  - Continue clinical management per OB provider.  - Consider weekly US  to assess amniotic fluid  - Growth US  every 4 weeks  - Delivery likely no later than [redacted] weeks gestation but will  depend on the clinical course  This was a limited ultrasound with a remote read. If an official  MFM consult is requested for any reason please call/place an  order in Epic. ----------------------------------------------------------------------                 Penney Bowling, DO Electronically Signed Final Report   04/04/2024 04:39 pm ----------------------------------------------------------------------    MAU Management/MDM: Orders Placed This Encounter  Procedures   US  MFM FETAL BPP WO NON STRESS   US  MFM UA CORD DOPPLER   Protein /  creatinine ratio, urine   CBC   Comprehensive metabolic panel   Glucose, capillary   Informed Consent Details: Physician/Practitioner Attestation; Transcribe to consent form and obtain patient signature   Initiate Pre-op Protocol   Clip operative site   Type and screen [facility]   Admit to Inpatient (patient's expected length of stay will be greater than 2 midnights or inpatient only procedure)    Meds ordered this encounter  Medications   azithromycin (ZITHROMAX) 500 mg in sodium chloride  0.9 % 250 mL IVPB    Indication::   Surgical Prophylaxis   ceFAZolin  (ANCEF ) IVPB 2g/100 mL premix    Indication::   Surgical Prophylaxis   sodium citrate -citric acid  (ORACIT) solution 30 mL   lactated ringers  bolus 1,000 mL     Available prenatal records reviewed.  Patient here at [redacted]w[redacted]d for DFM in setting of high-risk pregnancy. NST non-reactive, so BPP ordered. BPP returned 4/10 for tone and AFI 19. Discussed with patient indication for delivery in setting of non-reassuring fetal status with comorbidities of uncontrolled T2DM, cHTN, AMA, history of prior C/S. She elects for rLTCS after extensive discussion of R/B/A of TOLAC.  Reassessment: After C-section called, FHT became category 1 with moderate variability and frequent spontaneous accelerations (2 AM).  Now that NST is reactive, BPP is now 6/10.  Given preterm status, will keep patient in MAU for 6 hours with continuous monitoring and n.p.o. status, then repeat BPP to assess for fetal status again.  Patient's preferred language is [place]. Stratus translator used during patient interaction.   ASSESSMENT 1. Fetal heart rate non-reassuring affecting  management of mother   2. Pre-existing type 2 diabetes mellitus during pregnancy in third trimester   3. Uncontrolled type 2 diabetes mellitus with hyperglycemia (HCC)   4. Chronic hypertension during pregnancy, antepartum   5. Multigravida of advanced maternal age in third trimester   6. History of cesarean delivery   7. Obesity (BMI 30.0-34.9)   8. Supervision of high risk pregnancy, antepartum   9. Poor compliance   10. Non-English speaking patient     PLAN MAU observation.  Authur Leghorn, MD OB Fellow 05/04/2024  5:26 AM

## 2024-05-04 NOTE — Op Note (Signed)
 Cesarean Section Operative Note   Patient: Chloe Perez  Date of Procedure: 05/03/2024 - 05/04/2024  Procedure: Repeat Low Transverse Cesarean   Indications: malpresentation: breech  , non-reassuring fetal status, and previous uterine incision: low transverse  Pre-operative Diagnosis: Repeat cesarean section,  breech presentation, reverse flow.   Post-operative Diagnosis: Same  TOLAC Candidate: No  Surgeon: Surgeons and Role:    * Ilona Malta, Lorinda Root, MD - Primary    * Ozan, Jennifer, DO - Assisting  An experienced assistant was required given the standard of surgical care given the complexity of the case.  This assistant was needed for exposure, dissection, suctioning, retraction, instrument exchange, assisting with delivery with administration of fundal pressure, and for overall help during the procedure.   Anesthesia: spinal  Anesthesiologist: Grace Laura, MD   Antibiotics: Cefazolin    Estimated Blood Loss: 350 ml   Total IV Fluids: 1200 ml  Urine Output: 200 cc OF clear urine  Specimens: none   Complications: no complications   Indications: Chloe Perez is a 48 y.o. [phone] with an IUP [redacted]w[redacted]d presenting for unscheduled, urgent cesarean secondary to the indications listed above. Clinical course notable for presentation to MAU the previous evening reporting abdominal pain and decreased fetal movement. BPP was obtained which was 4/10 with both absent and reversed end diastolic flow on cord dopplers. Subsequently tracing was category I and reactive so prolonged monitoring was done in MAU. Repeat BPP was 8/8 now with only absent end diastolic flow on cord dopplers. Given overall clinical picture with AMA pregnancy, poorly controlled type 2 DM and chronic hypertension, and >46 weeks, decision made to proceed with delivery. In addition Dr. Arcola Kocher reviewed clinical scenario and agreed with this plan.   The risks of cesarean section were  discussed with the patient including but were not limited to: bleeding which may require transfusion or reoperation; infection which may require antibiotics; injury to bowel, bladder, ureters or other surrounding organs; injury to the fetus; need for additional procedures including hysterectomy in the event of a life-threatening hemorrhage; placental abnormalities wth subsequent pregnancies, incisional problems, thromboembolic phenomenon and other postoperative/anesthesia complications.  Patient also desires permanent sterilization.  Other reversible forms of contraception were discussed with patient; she declines all other modalities. Risks of procedure discussed with patient including but not limited to: risk of regret, permanence of method, bleeding, infection, injury to surrounding organs and need for additional procedures.  Failure risk of about 1% with increased risk of ectopic gestation if pregnancy occurs was also discussed with patient.  Also discussed possibility of post-tubal pain syndrome. The patient concurred with the proposed plan, giving informed written consent for the procedures.  Patient NPO status waived given urgency of case. Anesthesia and OR aware.  Preoperative prophylactic antibiotics and SCDs ordered on call to the OR.   After this counseling patient was non-committal about tubal ligation.   Findings: Viable infant in transverse presentation, no nuchal cord present. Apgars 8, 9. Weight 3300 g. Clear amniotic fluid. Normal placenta, appeared to be two vessel on gross examination. Normal uterus, Normal bilateral fallopian tubes, Normal bilateral ovaries. Minimal adhesive disease.  Procedure Details: A Time Out was held and the above information confirmed. The patient received intravenous antibiotics and had sequential compression devices applied to her lower extremities preoperatively. The patient was taken back to the operative suite where spinal anesthesia was administered. After  induction of anesthesia, the patient was draped and prepped in the usual sterile manner and  placed in a dorsal supine position with a leftward tilt. A low transverse skin incision was made with scalpel and carried down through the subcutaneous tissue to the fascia. Fascial incision was made and extended transversely. The fascia was separated from the underlying rectus tissue superiorly and inferiorly. The rectus muscles were separated in the midline bluntly and the peritoneum was entered bluntly. An Alexis retractor was placed to aid in visualization of the uterus. A bladder flap was not developed. A low transverse uterine incision was made. The infant was successfully delivered from transverse presentation, the umbilical cord was clamped after 1 minute. Cord ph was sent, and cord blood was obtained for evaluation. Of note, umbilical cord appeared to be two vessel and no definite third vessel was visualized when obtaining cord gas. Based on results suspect venous and arterial labels are reversed. The placenta was removed Intact and appeared normal. The uterine incision was closed with a single layer running unlocked suture of 0-Monocryl. Overall, excellent hemostasis was noted. The abdomen and the pelvis were cleared of all clot and debris and the Trula Gable was removed. Hemostasis was confirmed on all surfaces.  The peritoneum was reapproximated using 2-0 vicryl . The fascia was then closed using 0 Vicryl in a running fashion. The subcutaneous layer was reapproximated with 2-0 plain gut suture. The skin was closed with a 4-0 vicryl subcuticular stitch. The patient tolerated the procedure well. Sponge, lap, instrument and needle counts were correct x 2. She was taken to the recovery room in stable condition.  Cord gas values were as below, again clinically there were only two vessels visualized and based on values likely the value labeled arterial is actually venous and vice versa:  Cord Blood Gas (Venous)   (Abnormal)Collected: 05/04/24 1045Specimen: Blood from VeinUpdated: 05/04/24 1107 Ph Cord Blood (Venous) 7.2 Low Panic  pCO2 Cord Blood (Venous) 71 High mm[Hg] Bicarbonate 27.7 High mmol/L Acid-base deficit 2.0 mmol/L  Cord Blood Gas (Arterial)  (Abnormal)Collected: 05/04/24 1044Specimen: Blood from Artery-Updated: 05/04/24 1106 pH cord blood (arterial) 7.18 Low Panic  pCO2 cord blood (arterial) 82 High mmHg Bicarbonate 30.6 High mmol/L Acid-base deficit 0.1 mmol/L  Disposition: PACU - hemodynamically stable.    Signed: Teena Feast, MD/MPH Attending Family Medicine Physician, [facility] for [facility], [facility]

## 2024-05-04 NOTE — Progress Notes (Signed)
 MOB plans to breastfeed, baby in NICU. MOB set up w/ DEBP. This RN provided education about how to use the pump, pumping frequency, and milk storage guidelines. MOB verbalized understanding and encouraged to call for assistance w/ pumping as needed.  Chloe Glendenning, RN

## 2024-05-05 ENCOUNTER — Encounter (HOSPITAL_COMMUNITY): Payer: Self-pay | Admitting: Family Medicine

## 2024-05-05 LAB — CBC
HCT: 29.2 % — ABNORMAL LOW (ref 36.0–46.0)
Hemoglobin: 10 g/dL — ABNORMAL LOW (ref 12.0–15.0)
MCH: 29 pg (ref 26.0–34.0)
MCHC: 34.2 g/dL (ref 30.0–36.0)
MCV: 84.6 fL (ref 80.0–100.0)
Platelets: 170 10*3/uL (ref 150–400)
RBC: 3.45 MIL/uL — ABNORMAL LOW (ref 3.87–5.11)
RDW: 13.7 % (ref 11.5–15.5)
WBC: 10.6 10*3/uL — ABNORMAL HIGH (ref 4.0–10.5)
nRBC: 0 % (ref 0.0–0.2)

## 2024-05-05 LAB — GLUCOSE, CAPILLARY
Glucose-Capillary: 116 mg/dL — ABNORMAL HIGH (ref 70–99)
Glucose-Capillary: 224 mg/dL — ABNORMAL HIGH (ref 70–99)

## 2024-05-05 LAB — HEPATITIS B SURFACE ANTIGEN: Hepatitis B Surface Ag: NONREACTIVE

## 2024-05-05 NOTE — Progress Notes (Signed)
 Ordered meals by Orlan Leavens Spanish Medical Interpreter.

## 2024-05-05 NOTE — Inpatient Diabetes Management (Signed)
 Inpatient Diabetes Program Recommendations  AACE/ADA: New Consensus Statement on Inpatient Glycemic Control   Target Ranges:  Prepandial:   less than 140 mg/dL      Peak postprandial:   less than 180 mg/dL (1-2 hours)      Critically ill patients:  140 - 180 mg/dL    Latest Reference Range & Units 05/04/24 01:54 05/04/24 09:46 05/04/24 10:47 05/04/24 12:37 05/04/24 16:02 05/04/24 21:02 05/05/24 00:13 05/05/24 10:17  Glucose-Capillary 70 - 99 mg/dL 811 (H) 97 914 (H) 782 (H) 230 (H) 222 (H) 224 (H) 116 (H)   Review of Glycemic Control  Diabetes history: DM2 Outpatient Diabetes medications: NPH 38 units BID, Humalog  28 units TID, Metformin  XR 1000 mg BID, FreeStyle Libre 3 Current orders for Inpatient glycemic control: Metformin  XR 1000 mg BID, NPH 15 units BID, Novolog  0-15 units TID with meals   NOTE: Patient [redacted]W[redacted]D gestation with DM2 hx had c-section on 05/04/24. Noted patient received Decadron  5 mg at 10:42 and 5 mg at 10:53 on 05/04/24. Patient received NPH 15 units on 05/04/24 and already given NPH 15 units today. Will follow glucose trends and make recommendations if needed.  Thanks, Beacher Limerick, RN, MSN, CDCES Diabetes Coordinator Inpatient Diabetes Program [phone] (Team Pager from 8am to 5pm)

## 2024-05-05 NOTE — Progress Notes (Signed)
 Blood sugar 116. Patient due Novolin -N  15 units now. Notified S. Payne,CNM. She said to administer it now

## 2024-05-05 NOTE — Lactation Note (Signed)
 This note was copied from a baby's chart.  NICU Lactation Consultation Note  Patient Name: Chloe Perez ZYSAY'T Date: 05/05/2024 Age:46 hours  Reason for consult: Initial assessment; NICU baby; Other (Comment); Maternal endocrine disorder; Late-preterm 34-36.6wks (AMA, LGA, cHTN) Type of Endocrine Disorder?: Diabetes; PCOS (T2DM (insulin , metformin ))  SUBJECTIVE Visited with family of 35 1/63 weeks old AGA NICU female; baby "Chloe Perez" got admitted due to prematurity. Chloe Perez is a P2 and experienced breastfeeding and her plan is to primarily breastfeed along with pumping and bottle feeding. She feels a bit nervous about holding baby with all the cables/IV/ and feeding tube running. SLP Chloe Perez and this LC assisted with the latch but he wasn't able to quite get it, baby's suck is uncoordinated; tried suck training with a bit of formula and noticed that suck was weak, he briefly latched prior falling asleep (see LATCH score). Provided a pumping band in size "L" for hands on pumping and a Dr. Bevin Perez bag to sanitize pump parts; assisted with her pumping session while in baby's room, praised her for her efforts. She's aware that pumping will protect her supply in the meantime while "Chloe Perez" learns to breastfeed. Reviewed pumping schedule, pumping log, lactogenesis II/III, sucking/feeding patterns of a preemie and anticipatory guidelines.   OBJECTIVE Infant data: Mother's Current Feeding Choice: Breast Milk and Formula  O2 Device: Room Air  Infant feeding assessment IDFTS - Readiness: 1 (but 3 OOB, poor organization on pacifier, drowsy and falling asleep quickly at breast) IDFTS - Quality: 4   Maternal data: [id number] C-Section, Low Transverse Has patient been taught Hand Expression?: Yes Hand Expression Comments: no colostrum Significant Breast History:: moderate breast chnages during the pregnancy Current breast feeding challenges:: NICU admission Previous breastfeeding  challenges?: Infant separation (NICU admission, her first baby was born at 93 weeks) Does the patient have breastfeeding experience prior to this delivery?: Yes How long did the patient breastfeed?: 2 1/2 years Pumping frequency: initiated pumping at 6 hours post-partum Pumped volume: 0 mL (droplets) Flange Size: 21 Hands-free pumping top sizes: Large Chloe Perez) Risk factor for low/delayed milk supply:: prematurity, T2DM, AMA, PCOS, infant separation  WIC Program: Yes WIC Referral Sent?: Yes What county?: [place]  ASSESSMENT Infant: Latch: Repeated attempts needed to sustain latch, nipple held in mouth throughout feeding, stimulation needed to elicit sucking reflex. Audible Swallowing: None Type of Nipple: Everted at rest and after stimulation Comfort (Breast/Nipple): Soft / non-tender Hold (Positioning): Assistance needed to correctly position infant at breast and maintain latch. LATCH Score: 6  Feeding Status: Scheduled 8-11-2-5 Feeding method: Breast Nipple Type: Nfant Slow Flow (purple)  Maternal: Milk volume: Normal  INTERVENTIONS/PLAN Interventions: Interventions: Breast feeding basics reviewed; Assisted with latch; Skin to skin; Breast massage; Hand express; Breast compression; Adjust position; Support pillows; Coconut oil; DEBP; Education; [employer] brochure; CDC Guidelines for Breast Pump Cleaning; NICU Pumping Log Tools: Pump; Flanges; Coconut oil; Hands-free pumping top Pump Education: Setup, frequency, and cleaning; Milk Storage  Plan: Massage and hand express both breasts prior/after pumping; coconut oil prior pumping Pump both breasts on initiate mode every 3 hours for 15 minutes; ideally 8 pumping sessions/24 hours Continue taking "Chloe Perez" to breast on feeding cues around feeding times and call for assistance PRN  Chloe Perez present and supportive. All questions and concerns answered, family to contact [facility] services PRN.  Consult Status: NICU follow-up NICU Follow-up  type: New admission follow up; Maternal D/C visit   Karna Abed Newman Bare 05/05/2024, 2:59 PM

## 2024-05-06 LAB — RUBELLA SCREEN: Rubella: 17.5 {index} (ref >0.99)

## 2024-05-06 LAB — GLUCOSE, CAPILLARY: Glucose-Capillary: 136 mg/dL — ABNORMAL HIGH (ref 70–99)

## 2024-05-06 MED ORDER — INSULIN NPH (HUMAN) (ISOPHANE) 100 UNIT/ML ~~LOC~~ SUSP
15.0000 [IU] | Freq: Two times a day (BID) | SUBCUTANEOUS | Status: DC
  Filled 2024-05-06: qty 10

## 2024-05-06 MED ORDER — NIFEDIPINE ER OSMOTIC RELEASE 30 MG PO TB24
30.0000 mg | ORAL_TABLET | Freq: Every day | ORAL | Status: DC
Start: 2024-05-06 — End: 2024-05-07
  Administered 2024-05-06 – 2024-05-07 (×2): 30 mg via ORAL
  Filled 2024-05-06 (×2): qty 1

## 2024-05-06 MED ORDER — INSULIN NPH (HUMAN) (ISOPHANE) 100 UNIT/ML ~~LOC~~ SUSP
17.0000 [IU] | Freq: Two times a day (BID) | SUBCUTANEOUS | Status: DC
  Administered 2024-05-07: 17 [IU] via SUBCUTANEOUS
  Filled 2024-05-06: qty 10

## 2024-05-06 MED ORDER — FUROSEMIDE 20 MG PO TABS
20.0000 mg | ORAL_TABLET | Freq: Every day | ORAL | Status: DC
  Administered 2024-05-06 – 2024-05-07 (×2): 20 mg via ORAL
  Filled 2024-05-06 (×2): qty 1

## 2024-05-06 MED ORDER — POTASSIUM CHLORIDE CRYS ER 20 MEQ PO TBCR
20.0000 meq | EXTENDED_RELEASE_TABLET | Freq: Every day | ORAL | Status: DC
Start: 2024-05-06 — End: 2024-05-07
  Administered 2024-05-06 – 2024-05-07 (×2): 20 meq via ORAL
  Filled 2024-05-06 (×2): qty 1

## 2024-05-06 NOTE — Progress Notes (Signed)
 I was with Chloe Perez  CMN ,giving explanation to the pt on her medications, by Alexandra Ice Spanish Medical Interpreter.

## 2024-05-06 NOTE — Progress Notes (Signed)
 POSTPARTUM PROGRESS NOTE  Post Partum Day 2 following repeat LTCS  Subjective:  Chloe Perez is a 48 y.o. [phone] s/p urgent rLTCS for non-reassuring fetal monitoring at [redacted]w[redacted]d.  No acute events overnight.  Pt denies problems with ambulating, voiding or po intake.  She denies nausea or vomiting.  Pain is moderately controlled.  She has had flatus. She has had bowel movement.  Lochia Minimal.   Objective: Blood pressure (!) 147/66, pulse 75, temperature 98 F (36.7 C), temperature source Oral, resp. rate 17, weight 79.2 kg, last menstrual period 08/13/2023, SpO2 99%, unknown if currently breastfeeding.  Physical Exam:  General: alert, cooperative and no distress Chest: no respiratory distress Heart:regular rate, distal pulses intact Abdomen: soft, nontender,  Uterine Fundus: firm, appropriately tender DVT Evaluation: No calf swelling or tenderness Extremities: no edema Skin: warm, dry; incision clean/dry/intact  Recent Labs    05/04/24 0920 05/05/24 0448  HGB 11.9* 10.0*  HCT 35.3* 29.2*    Assessment/Plan: Chloe Perez is a 48 y.o. [phone] s/p  urgent rLTCS for non-reassuring fetal monitoring at [redacted]w[redacted]d at [redacted]w[redacted]d   PPD#2 - Doing well, bonding with baby in NICU  Contraception: POPs Feeding: breast and formula T2DM: appreciating recommendations from diabetes coordinator, currently on insulin  NPH 15 units twice daily, novolog  0-15 units three times daily with meals, metformin  1000 mg twice daily  cHTN: labetalol  300 mg three times daily, consider lasix/k  Dispo: Plan for discharge 6/10 or 6/11.   LOS: 2 days   Andrez Keel MD [facility] Resident PGY-1  [facility], [facility] 05/06/24 7:56 AM  I was present for the exam and agree with above.  CHTN - Change to Procardia  for once a day dosing. Added Lasix and Potassium  T2DM - CBGS still mildly elevated. Pt also had recent steroid dose which  is likely affecting blood sugars. On Metformin , NPH BID and SSI. Needing 2-3 Units. Pt has a very hard time managing complicated med regimens. Had been on Metformin  1000 BID and Glimepiride  prior to pregnancy. Has continued Metformin , but Glimepiride  is possibly hazardous per [facility]. Will D/C SSI and increase NPH from 15 to 17 BID per consult w/ Dr. Vallarie Gauze.   Felipe Horton Jilliane Kazanjian , CNM 05/06/2024 12:48 PM

## 2024-05-06 NOTE — Lactation Note (Signed)
 This note was copied from a baby's chart.  NICU Lactation Consultation Note  Patient Name: Chloe Perez Date: 05/06/2024 Age:46 hours  Reason for consult: Follow-up assessment; NICU baby; Late-preterm 34-36.6wks; Maternal endocrine disorder; Other (Comment) (GHTN, AMA) Type of Endocrine Disorder?: Diabetes (uncontrolled Type 2 DM)  SUBJECTIVE  LC met briefly with P2 Mom of LPT baby "[name]" in the NICU.  Mom was heading to NICU to do STS with baby.  Shared with Mom that this was a great thing to do to boost her milk supply.  Encouraged her to pump after STS.  Mom said she isn't getting anything when she pumps.  LC shared that this was WNL.  Encouraged continued pumping and STS.  Mom reports she got a call from [facility], she didn't answer, but plans to call back today.  OBJECTIVE Infant data: Mother's Current Feeding Choice: Breast Milk and Formula  O2 Device: Room Air  Infant feeding assessment IDFTS - Readiness: 3 IDFTS - Quality: 4   Maternal data: [id number] C-Section, Low Transverse Has patient been taught Hand Expression?: Yes Hand Expression Comments: no colostrum Significant Breast History:: moderate breast chnages during the pregnancy Current breast feeding challenges:: NICU admission Previous breastfeeding challenges?: Infant separation (NICU admission, her first baby was born at 42 weeks) Does the patient have breastfeeding experience prior to this delivery?: Yes How long did the patient breastfeed?: 2 1/2 years Pumping frequency: inconsistent, encouraged frequent pumping Pumped volume: 0 mL (drops) Flange Size: 21 Hands-free pumping top sizes: Large Martina Sledge) Risk factor for low/delayed milk supply:: prematurity, T2DM, AMA, PCOS, infant separation  WIC Program: Yes WIC Referral Sent?: Yes What county?: [place]  ASSESSMENT Infant:  Feeding Status: IDF-1; Scheduled 8-11-2-5 Feeding method: Tube/Gavage (Bolus) Nipple Type: Nfant Extra Slow Flow  (gold)  Maternal: Milk volume: Normal  INTERVENTIONS/PLAN Interventions: Interventions: Skin to skin; Breast massage; Hand express; DEBP Discharge Education: Engorgement and breast care Tools: Pump; Flanges; Hands-free pumping top Pump Education: Setup, frequency, and cleaning; Milk Storage  Plan: Consult Status: NICU follow-up NICU Follow-up type: Verify absence of engorgement; Verify onset of copious milk; Verify DEBP issuance   [name] 05/06/2024, 4:55 PM

## 2024-05-06 NOTE — Social Work (Signed)
 Patient screened out for psychosocial assessment since none of the following apply: Psychosocial stressors documented in mother or baby's chart Gestation less than 32 weeks Code at delivery  Infant with anomalies Please contact the Clinical Social Worker if specific needs arise, by MOB's request, or if MOB scores greater than 9/yes to question 10 on Edinburgh Postpartum Depression Screen. MOB completed [place] with score of: 6  Nickolas Barr, MSW, [place] Clinical Social Worker  [phone] 05/06/2024  11:32 AM

## 2024-05-07 ENCOUNTER — Inpatient Hospital Stay (HOSPITAL_COMMUNITY)
Admission: AD | Admit: 2024-05-07 | Discharge: 2024-05-08 | Disposition: A | Discharge status: Home / Self Care | Payer: MEDICAID | Attending: Obstetrics and Gynecology | Admitting: Obstetrics and Gynecology

## 2024-05-07 ENCOUNTER — Encounter (HOSPITAL_COMMUNITY): Payer: Self-pay | Admitting: Obstetrics and Gynecology

## 2024-05-07 ENCOUNTER — Other Ambulatory Visit (HOSPITAL_COMMUNITY): Payer: Self-pay

## 2024-05-07 ENCOUNTER — Encounter: Payer: Self-pay | Admitting: Certified Nurse Midwife

## 2024-05-07 DIAGNOSIS — O139 Gestational [pregnancy-induced] hypertension without significant proteinuria, unspecified trimester: Secondary | ICD-10-CM

## 2024-05-07 DIAGNOSIS — R6883 Chills (without fever): Secondary | ICD-10-CM | POA: Insufficient documentation

## 2024-05-07 DIAGNOSIS — R42 Dizziness and giddiness: Secondary | ICD-10-CM | POA: Insufficient documentation

## 2024-05-07 DIAGNOSIS — O165 Unspecified maternal hypertension, complicating the puerperium: Secondary | ICD-10-CM | POA: Insufficient documentation

## 2024-05-07 DIAGNOSIS — N939 Abnormal uterine and vaginal bleeding, unspecified: Secondary | ICD-10-CM | POA: Insufficient documentation

## 2024-05-07 DIAGNOSIS — R112 Nausea with vomiting, unspecified: Secondary | ICD-10-CM | POA: Insufficient documentation

## 2024-05-07 DIAGNOSIS — R519 Headache, unspecified: Secondary | ICD-10-CM | POA: Insufficient documentation

## 2024-05-07 DIAGNOSIS — R7401 Elevation of levels of liver transaminase levels: Secondary | ICD-10-CM

## 2024-05-07 DIAGNOSIS — Z98891 History of uterine scar from previous surgery: Secondary | ICD-10-CM | POA: Insufficient documentation

## 2024-05-07 MED ORDER — OXYCODONE HCL 5 MG PO TABS
5.0000 mg | ORAL_TABLET | Freq: Three times a day (TID) | ORAL | 0 refills | Status: DC | PRN
  Filled 2024-05-07: qty 15, 5d supply, fill #0

## 2024-05-07 MED ORDER — CYCLOBENZAPRINE HCL 5 MG PO TABS
10.0000 mg | ORAL_TABLET | Freq: Once | ORAL | Status: AC
  Administered 2024-05-07: 10 mg via ORAL
  Filled 2024-05-07: qty 2

## 2024-05-07 MED ORDER — IBUPROFEN 600 MG PO TABS
600.0000 mg | ORAL_TABLET | Freq: Four times a day (QID) | ORAL | 0 refills | Status: DC
  Filled 2024-05-07: qty 30, 8d supply, fill #0

## 2024-05-07 MED ORDER — INSULIN SYRINGE 30G X 5/16" 0.3 ML MISC
1.0000 [IU] | Freq: Two times a day (BID) | 6 refills | Status: DC
  Filled 2024-05-07: qty 100, 30d supply, fill #0

## 2024-05-07 MED ORDER — ACETAMINOPHEN 325 MG PO TABS
650.0000 mg | ORAL_TABLET | ORAL | 2 refills | Status: AC | PRN
  Filled 2024-05-07: qty 100, 9d supply, fill #0
  Filled 2024-05-26: qty 100, 10d supply, fill #0
  Filled 2024-05-26: qty 100, 9d supply, fill #0
  Filled 2024-08-14: qty 100, 10d supply, fill #1

## 2024-05-07 MED ORDER — METFORMIN HCL 1000 MG PO TABS
1000.0000 mg | ORAL_TABLET | Freq: Two times a day (BID) | ORAL | 1 refills | Status: DC
  Filled 2024-05-07 – 2024-05-26 (×3): qty 60, 30d supply, fill #0
  Filled 2024-06-30: qty 60, 30d supply, fill #1

## 2024-05-07 MED ORDER — NIFEDIPINE ER 30 MG PO TB24
30.0000 mg | ORAL_TABLET | Freq: Every day | ORAL | 1 refills | Status: DC
  Filled 2024-05-07 – 2024-05-26 (×3): qty 30, 30d supply, fill #0

## 2024-05-07 MED ORDER — INSULIN SYRINGE-NEEDLE U-100 30G X 1/2" 0.3 ML MISC
3 refills | Status: DC
  Filled 2024-05-07: qty 100, 30d supply, fill #0

## 2024-05-07 MED ORDER — POTASSIUM CHLORIDE CRYS ER 20 MEQ PO TBCR
20.0000 meq | EXTENDED_RELEASE_TABLET | Freq: Every day | ORAL | 0 refills | Status: DC
  Filled 2024-05-07: qty 2, 2d supply, fill #0

## 2024-05-07 MED ORDER — FUROSEMIDE 20 MG PO TABS
20.0000 mg | ORAL_TABLET | Freq: Every day | ORAL | 0 refills | Status: DC
  Filled 2024-05-07: qty 2, 2d supply, fill #0

## 2024-05-07 MED ORDER — METFORMIN HCL ER (OSM) 1000 MG PO TB24
1000.0000 mg | ORAL_TABLET | Freq: Two times a day (BID) | ORAL | 1 refills | Status: DC
  Filled 2024-05-07: qty 180, 90d supply, fill #0

## 2024-05-07 MED ORDER — INSULIN NPH (HUMAN) (ISOPHANE) 100 UNIT/ML ~~LOC~~ SUSP
17.0000 [IU] | Freq: Two times a day (BID) | SUBCUTANEOUS | 3 refills | Status: DC
  Filled 2024-05-07 – 2024-05-26 (×3): qty 10, 29d supply, fill #0

## 2024-05-07 NOTE — TOC Initial Note (Signed)
 Transition of Care [facility]) - Initial/Assessment Note    Patient Details  Name: Chloe Perez MRN: [id number] Date of Birth: Feb 27, 1976  [facility] CM/SW Contact:    Thirza Fleet, RN Phone Number:[phone] 05/07/2024, 10:38 AM  Clinical Narrative:                   Patient is a post delivery pre-existing type 2 diabetic patient in pregnancy.  Patient has no insurance and patient is needing insulin  for discharge. TOC pharmacy contacted CM.  MATCH provided for discharge to provide medications at no cost to patient.  Financial counselor notified also to follow up with patient. They will deliver medications to patient's room prior to discharge.   Activities of Daily Living   ADL Screening (condition at time of admission) Independently performs ADLs?: Yes (appropriate for developmental age) Is the patient deaf or have difficulty hearing?: No Does the patient have difficulty seeing, even when wearing glasses/contacts?: No Does the patient have difficulty concentrating, remembering, or making decisions?: No  Admission diagnosis:  NST (non-stress test) nonreactive [O28.8] Encounter for supervision of other normal pregnancy in third trimester [Z34.83] Patient Active Problem List   Diagnosis Date Noted   NST (non-stress test) nonreactive 05/04/2024   Encounter for supervision of other normal pregnancy in third trimester 05/04/2024   Cesarean delivery delivered 05/04/2024   Uncontrolled diabetes mellitus with hyperglycemia (HCC) 04/04/2024   Chronic hypertension during pregnancy, antepartum 01/18/2024   Poor compliance 01/08/2024   Supervision of high risk pregnancy, antepartum 12/19/2023   Heart murmur, systolic 10/24/2023   Obesity (BMI 30.0-34.9) 10/24/2023   History of chest pain 10/24/2023   History of cesarean delivery 05/29/2018   Non-English speaking patient 02/14/2018   Advanced maternal age in multigravida 02/14/2018   Pre-existing type  2 diabetes mellitus in pregnancy 02/08/2017   Type II diabetes mellitus (HCC) 06/03/2012   PCP:  Ronalee Cocking, MD Pharmacy:   [id number] - [place] - [address] [place] [address] Phone: [phone] Fax: [phone]  [id number] - [place], [place] - [address] AT [facility] [address] [place] [address] Phone: [phone] Fax: [phone]  [place] [facility] [address] [place] [address] Phone: [phone] Fax: [phone]  [facility] [address] [place] [address] Phone: [phone] Fax: [phone]  [facility] [address] [place] [address] Phone: [phone] Fax: [phone]     Social Drivers of Health (SDOH) Social History: SDOH Screenings   Food Insecurity: No Food Insecurity (05/04/2024)  Housing: Low Risk  (05/04/2024)  Transportation Needs: No Transportation Needs (05/04/2024)  Utilities: Not At Risk (05/04/2024)  Depression (PHQ2-9): Low Risk  (01/07/2024)  Financial Resource Strain: Low Risk  (10/24/2023)  Social Connections: Unknown (04/12/2024)  Tobacco Use: Medium Risk (05/04/2024)   SDOH Interventions:     Readmission Risk Interventions     No data to display

## 2024-05-07 NOTE — MAU Note (Addendum)
 MAU Triage Note:  .Chloe Perez is a 48 y.o. at Unknown here in MAU reporting: s/p Cesarean after being discharged from the hospital today. She started having a headache 2 hours ago. She last took ibuprofen  and tylenol  at 1900 - no relief from headache. She also reports incisional pain from her C/S.   Patient complaint: PP, Severe HA  PIH Assessment: Headache present: Yes ;Has not responded to treatment Visual disturbances: None RUQ pain/Epigastric: None Atypical edema: ; BLE, ; ongoing/no abrupt changes Hx of HBP: CHTN BP Medications: she last took her nifedipine  at 1400, she is unsure of how often she is supposed to take it  Pain Score: 8  Pain Location: Abdomen Pain Score: 7 Pain Location: Incision   Onset of complaint: today LMP: No LMP recorded.  Vitals:   05/07/24 2233  BP: (!) 165/72  Pulse: 88  Resp: 14  Temp: 98.4 F (36.9 C)  SpO2: 100%    Lab orders placed from triage: N/A

## 2024-05-07 NOTE — Progress Notes (Signed)
 Pt reviewed postpartum discharge video in Spanish prior to discharge. [name] [facility] reviewed discharge medications with pt before discharge (with [place] Spanish interpreter assisting). RN reviewed follow-up appointments. Pt expressed understanding of all discharge education and has no questions.

## 2024-05-07 NOTE — MAU Provider Note (Signed)
 Chief Complaint:  No chief complaint on file.   Event Date/Time   First Provider Initiated Contact with Patient 05/07/24 2252       HPI: Chloe Perez is a 48 y.o. [id number] who presents to maternity admissions reporting headache for 2 hours.  Went home from hospital today. Had a Cesarean Section on . She reports vaginal bleeding, vaginal itching/burning, urinary symptoms, h/a, dizziness, n/v, or fever/chills.    Headache  This is a new problem. The current episode started today. The problem occurs constantly. The quality of the pain is described as aching. Pertinent negatives include no back pain, blurred vision, muscle aches, nausea or photophobia.   RN Note: Chloe Perez is a 48 y.o. at Unknown here in MAU reporting: s/p Cesarean after being discharged from the hospital today. She started having a headache 2 hours ago. She last took ibuprofen  and tylenol  at 1900 - no relief from headache. She also reports incisional pain from her C/S.   Past Medical History: Past Medical History:  Diagnosis Date   Diabetes mellitus without complication (HCC)    DM type 2 metformin     Female hypogonadism 02/08/2017   Heart pain    Hyperlipidemia associated with type 2 diabetes mellitus (HCC) 10/24/2023   Missed AB    PCOS (polycystic ovarian syndrome) 02/08/2017    Past obstetric history: OB History  Gravida Para Term Preterm AB Living  3 2  2 1 2   SAB IAB Ectopic Multiple Live Births  1   0 2    # Outcome Date GA Lbr Len/2nd Weight Sex Type Anes PTL Lv  3 Preterm 05/04/24 [redacted]w[redacted]d  3300 g M CS-LTranv Spinal  LIV  2 Preterm 05/28/18 [redacted]w[redacted]d    CS-Unspec   LIV  1 SAB             Past Surgical History: Past Surgical History:  Procedure Laterality Date   CESAREAN SECTION N/A 05/28/2018   Procedure: CESAREAN SECTION;  Surgeon: Malka Sea, DO;  Location: [facility]IR[facility]ING [facility];  Service: Obstetrics;  Laterality: N/A;   CESAREAN SECTION N/A 05/04/2024    Procedure: CESAREAN DELIVERY;  Surgeon: Teena Feast, MD;  Location: [facility] LD ORS;  Service: Obstetrics;  Laterality: N/A;   Uterine polyps     Had two different surgeris to remove--last 7 years ago.    Family History: Family History  Problem Relation Age of Onset   Hypertension Mother    Diabetes Mother    Heart disease Father     Social History: Social History   Tobacco Use   Smoking status: Former    Current packs/day: 0.00    Types: Cigarettes    Quit date: 09/28/2023    Years since quitting: 0.6   Smokeless tobacco: Never   Tobacco comments:    Discussed nicotine gum and how to use.  Vaping Use   Vaping status: Never Used  Substance Use Topics   Alcohol  use: Not Currently    Comment: sometimes   Drug use: No    Allergies: No Known Allergies  Meds:  Medications Prior to Admission  Medication Sig Dispense Refill Last Dose/Taking   acetaminophen  (TYLENOL ) 325 MG tablet Take 2 tablets (650 mg total) by mouth every 4 (four) hours as needed. 100 tablet 2 05/07/2024 at  8:30 PM   ibuprofen  (ADVIL ) 600 MG tablet Take 1 tablet (600 mg total) by mouth every 6 (six) hours. 30 tablet 0 05/07/2024 at  7:00 PM  insulin  NPH Human (NOVOLIN  N) 100 UNIT/ML injection Inject 0.17 mLs (17 Units total) into the skin 2 (two) times daily at 8 am and 10 pm. 10 mL 3 05/07/2024   Insulin  Syringe-Needle U-100 (INSULIN  SYRINGE .3CC/30GX5/16) 30G X 5/16 0.3 ML MISC Use in the morning and at bedtime. 100 each 6 05/07/2024   metFORMIN  (GLUCOPHAGE ) 1000 MG tablet Take 1 tablet (1,000 mg total) by mouth 2 (two) times daily with a meal. 180 tablet 1 05/07/2024   NIFEdipine  (ADALAT  CC) 30 MG 24 hr tablet Take 1 tablet (30 mg total) by mouth daily. 30 tablet 1 05/07/2024 at  2:00 PM   Alcohol  Swabs (ALCOHOL  PADS) 70 % PADS 1 Pad by Does not apply route 4 (four) times daily. 100 each 2    Blood Pressure Monitoring (OMRON 3 SERIES BP MONITOR) DEVI Use to take blood pressure as needed 1 each 0     Continuous Glucose Sensor (FREESTYLE LIBRE 3 SENSOR) MISC 1 Device by Does not apply route as directed. Place 1 sensor on the skin every 14 days. Use to check glucose continuously 2 each 2    furosemide (LASIX) 20 MG tablet Take 1 tablet (20 mg total) by mouth daily. (Patient not taking: Reported on 05/07/2024) 2 tablet 0 Not Taking   Insulin  Syringe-Needle U-100 30G X 1/2 0.3 ML MISC Use as directed 100 each 3    oxyCODONE  (ROXICODONE ) 5 MG immediate release tablet Take 1 tablet (5 mg total) by mouth every 8 (eight) hours as needed. 15 tablet 0    potassium chloride SA (KLOR-CON M) 20 MEQ tablet Take 1 tablet (20 mEq total) by mouth daily. 2 tablet 0    prenatal vitamin w/FE, FA (PRENATAL 1 + 1) 27-1 MG TABS tablet Take 1 tablet by mouth daily. 30 tablet 11     I have reviewed patient's Past Medical Hx, Surgical Hx, Family Hx, Social Hx, medications and allergies.  ROS:  Review of Systems  Eyes:  Negative for blurred vision and photophobia.  Gastrointestinal:  Negative for nausea.  Musculoskeletal:  Negative for back pain.  Neurological:  Positive for headaches.   Other systems negative     Physical Exam  Patient Vitals for the past 24 hrs:  BP Temp Temp src Pulse Resp SpO2  05/07/24 2246 (!) 148/69 -- -- 89 -- --  05/07/24 2243 (!) 154/74 -- -- 89 -- --  05/07/24 2233 (!) 165/72 98.4 F (36.9 C) Oral 88 14 100 %   Vitals:   05/07/24 2246 05/07/24 2301 05/07/24 2316 05/07/24 2331  BP: (!) 148/69 (!) 152/70 (!) 147/63 (!) 157/76   05/07/24 2346 05/08/24 0001 05/08/24 0031 05/08/24 0214  BP: (!) 152/74 (!) 142/70 (!) 154/70 (!) 153/74   05/08/24 0216 05/08/24 0231 05/08/24 0246 05/08/24 0301  BP: (!) 152/69 (!) 158/75 (!) 155/76 139/63    Constitutional: Well-developed, well-nourished female in no acute distress.  Cardiovascular: normal rate  Respiratory: normal effort, no distress.  GI: Abd soft, non-tender.  Nondistended.  No rebound, No guarding.   MS: Extremities  nontender, no edema, normal ROM Neurologic: Alert and oriented x 4.   Grossly nonfocal. Skin:  Warm and Dry Psych:  Affect appropriate.   Labs: Results for orders placed or performed during the hospital encounter of 05/07/24 (from the past 24 hours)  CBC     Status: Abnormal   Collection Time: 05/07/24 11:53 PM  Result Value Ref Range   WBC 7.5 4.0 - 10.5 K/uL  RBC 4.21 3.87 - 5.11 MIL/uL   Hemoglobin 11.9 (L) 12.0 - 15.0 g/dL   HCT 02.7 (L) 25.3 - 66.4 %   MCV 85.3 80.0 - 100.0 fL   MCH 28.3 26.0 - 34.0 pg   MCHC 33.1 30.0 - 36.0 g/dL   RDW 40.3 47.4 - 25.9 %   Platelets 251 150 - 400 K/uL   nRBC 0.0 0.0 - 0.2 %  Comprehensive metabolic panel     Status: Abnormal   Collection Time: 05/07/24 11:53 PM  Result Value Ref Range   Sodium 135 135 - 145 mmol/L   Potassium 3.8 3.5 - 5.1 mmol/L   Chloride 100 98 - 111 mmol/L   CO2 22 22 - 32 mmol/L   Glucose, Bld 150 (H) 70 - 99 mg/dL   BUN 11 6 - 20 mg/dL   Creatinine, Ser 5.63 0.44 - 1.00 mg/dL   Calcium  9.5 8.9 - 10.3 mg/dL   Total Protein 6.4 (L) 6.5 - 8.1 g/dL   Albumin 3.1 (L) 3.5 - 5.0 g/dL   AST 58 (H) 15 - 41 U/L   ALT 66 (H) 0 - 44 U/L   Alkaline Phosphatase 92 38 - 126 U/L   Total Bilirubin 0.4 0.0 - 1.2 mg/dL   GFR, Estimated >87 >56 mL/min   Anion gap 13 5 - 15    --/--/B POS (06/08 0225)  Imaging:    MAU Course/MDM: I have reviewed the triage vital signs and the nursing notes.   Pertinent labs & imaging results that were available during my care of the patient were reviewed by me and considered in my medical decision making (see chart for details).      I have reviewed her medical records including past results, notes and treatments.   I have ordered labs as follows: See above.  Slight increase in Transaminases Imaging ordered: none Results reviewed.   Treatments in MAU included Flexeril given which did not help much.  IV started and Reglan/Benadry/Magnesium sulfate 2g helped some but not entirely   We  then gave Excedrin Tension with significant relief in headache..   Pt stable at time of discharge.  Assessment: Postpartum headache Gestational Hypertension  Plan: Discharge home Recommend Continue antihypertensive meds (nifedipine /lasix) Has appt on 16th for incision and BP check  Will repeat labs  Encouraged to return here or to other Urgent Care/ED if she develops worsening of symptoms, increase in pain, fever, or other concerning symptoms.   Holmes Lusher CNM, MSN Certified Nurse-Midwife 05/07/2024 10:52 PM

## 2024-05-07 NOTE — Lactation Note (Signed)
 This note was copied from a baby's chart.  NICU Lactation Consultation Note  Patient Name: Chloe Perez [id number] Date: 05/07/2024 Age:48 years  Reason for consult: Follow-up assessment; Maternal discharge; NICU baby; Late-preterm 34-36.6wks; Other (Comment) (AMA) Type of Endocrine Disorder?: PCOS; Diabetes  SUBJECTIVE  LC met with P2 Mom of LPT baby Chloe Perez in the NICU.  Mom is being discharged from [facility] and would like a loaner pump.  Loaner pump paperwork filled out and pump provided.  Mom is pumping consistently and expressing more volume.  Breasts are filling and a little tender.  Engorgement prevention and treatment reviewed.    Mom states she plans to go home to shower and clean up.  Mom understands she needs to bring the pump parts home with her.  LC placed Sanitizer Bag in baby's room labeled for pump parts.  Plan- 1- STS with baby watching for feeding readiness, asking for assistance prn 2- Pump both breasts on maintain mode for 20-30 mins at each feeding or every 3 hrs. 3- ask for LC prn  OBJECTIVE Infant data: No data recorded O2 Device: Room Air  Infant feeding assessment IDFTS - Readiness: 2 IDFTS - Quality: 4   Maternal data: [id number] C-Section, Low Transverse Pumping frequency: 8 times per 24 hrs Pumped volume: 30 mL Flange Size: 21 Hands-free pumping top sizes: Large Martina Sledge)  WIC Program: Yes WIC Referral Sent?: Yes What county?: [place] Pump: WIC Loaner  ASSESSMENT Infant:  Feeding Status: Scheduled 8-11-2-5 Feeding method: Bottle; Tube/Gavage (Bolus) Nipple Type: Nfant Extra Slow Flow (gold)  Maternal: Milk volume: Normal  INTERVENTIONS/PLAN Interventions: Interventions: Breast feeding basics reviewed; Skin to skin; Breast massage; Hand express; DEBP; Education Discharge Education: Engorgement and breast care Tools: Pump; Flanges; Hands-free pumping top Pump Education: Setup, frequency, and cleaning; Milk Storage  Plan: Consult  Status: NICU follow-up NICU Follow-up type: Verify onset of copious milk; Verify absence of engorgement   [name] 05/07/2024, 1:25 PM

## 2024-05-08 ENCOUNTER — Ambulatory Visit: Payer: Self-pay

## 2024-05-08 ENCOUNTER — Other Ambulatory Visit (HOSPITAL_COMMUNITY): Payer: Self-pay

## 2024-05-08 DIAGNOSIS — O9089 Other complications of the puerperium, not elsewhere classified: Secondary | ICD-10-CM

## 2024-05-08 DIAGNOSIS — R519 Headache, unspecified: Secondary | ICD-10-CM

## 2024-05-08 LAB — COMPREHENSIVE METABOLIC PANEL WITH GFR
ALT: 66 U/L — ABNORMAL HIGH (ref 0–44)
AST: 58 U/L — ABNORMAL HIGH (ref 15–41)
Albumin: 3.1 g/dL — ABNORMAL LOW (ref 3.5–5.0)
Alkaline Phosphatase: 92 U/L (ref 38–126)
Anion gap: 13 (ref 5–15)
BUN: 11 mg/dL (ref 6–20)
CO2: 22 mmol/L (ref 22–32)
Calcium: 9.5 mg/dL (ref 8.9–10.3)
Chloride: 100 mmol/L (ref 98–111)
Creatinine, Ser: 0.59 mg/dL (ref 0.44–1.00)
GFR, Estimated: >60 mL/min (ref >60)
Glucose, Bld: 150 mg/dL — ABNORMAL HIGH (ref 70–99)
Potassium: 3.8 mmol/L (ref 3.5–5.1)
Sodium: 135 mmol/L (ref 135–145)
Total Bilirubin: 0.4 mg/dL (ref 0.0–1.2)
Total Protein: 6.4 g/dL — ABNORMAL LOW (ref 6.5–8.1)

## 2024-05-08 LAB — CBC
HCT: 35.9 % — ABNORMAL LOW (ref 36.0–46.0)
Hemoglobin: 11.9 g/dL — ABNORMAL LOW (ref 12.0–15.0)
MCH: 28.3 pg (ref 26.0–34.0)
MCHC: 33.1 g/dL (ref 30.0–36.0)
MCV: 85.3 fL (ref 80.0–100.0)
Platelets: 251 10*3/uL (ref 150–400)
RBC: 4.21 MIL/uL (ref 3.87–5.11)
RDW: 13.8 % (ref 11.5–15.5)
WBC: 7.5 10*3/uL (ref 4.0–10.5)
nRBC: 0 % (ref 0.0–0.2)

## 2024-05-08 MED ORDER — METOCLOPRAMIDE HCL 5 MG/ML IJ SOLN
10.0000 mg | Freq: Once | INTRAMUSCULAR | Status: AC
  Administered 2024-05-08: 10 mg via INTRAVENOUS
  Filled 2024-05-08: qty 2

## 2024-05-08 MED ORDER — MAGNESIUM SULFATE 2 GM/50ML IV SOLN
2.0000 g | Freq: Once | INTRAVENOUS | Status: AC
  Administered 2024-05-08: 2 g via INTRAVENOUS
  Filled 2024-05-08: qty 50

## 2024-05-08 MED ORDER — LACTATED RINGERS IV SOLN: Freq: Once | INTRAVENOUS | Status: AC

## 2024-05-08 MED ORDER — ACETAMINOPHEN-CAFFEINE 500-65 MG PO TABS
2.0000 | ORAL_TABLET | Freq: Once | ORAL | Status: AC
  Administered 2024-05-08: 2 via ORAL
  Filled 2024-05-08: qty 2

## 2024-05-08 MED ORDER — DIPHENHYDRAMINE HCL 50 MG/ML IJ SOLN
25.0000 mg | Freq: Once | INTRAMUSCULAR | Status: AC
  Administered 2024-05-08: 25 mg via INTRAVENOUS
  Filled 2024-05-08: qty 1

## 2024-05-12 ENCOUNTER — Ambulatory Visit: Payer: Self-pay

## 2024-05-12 ENCOUNTER — Other Ambulatory Visit: Payer: Self-pay

## 2024-05-12 VITALS — BP 115/63 | HR 93 | Wt 154.0 lb

## 2024-05-12 DIAGNOSIS — Z0130 Encounter for examination of blood pressure without abnormal findings: Secondary | ICD-10-CM

## 2024-05-12 DIAGNOSIS — Z4889 Encounter for other specified surgical aftercare: Secondary | ICD-10-CM

## 2024-05-12 NOTE — Progress Notes (Signed)
 Blood Pressure Check Visit  Chloe Perez is here for blood pressure check following repeat c-section on 05/04/24. Patient has a diagnosis of cHTN. Patient was prescribed 20 mg lasix  and potassium for 2 days along with Procardia  30 mg daily.  BP today is 115/63. Patient endorses some headaches with some relief with Tylenol  and ibuprofen  and denies any dizzness blurred vision shortness of breath peripheral edema. Reviewed MAU precautions with patient.   Incision Check Visit  Patient is also here for incision check following repeat c-section on 05/04/24.   Assessment: Incision look clean, dry, intact, and well approximated. No s/s of infection. Incision with dermabond present.   Education: Reviewed good daily wound care and s/s of infection with patient. Advised patient to call our office for any s/s of infection and to continue taking BP medication.   Patient will follow up post partum visit on 06/17/24 at 1:55 PM. Patient confirmed scheduled appointment.   This visit used Spanish video interpreter throughout this visit.   Lennart Quitter, RN 05/12/2024  2:40 PM

## 2024-05-13 ENCOUNTER — Ambulatory Visit: Payer: Self-pay | Admitting: Internal Medicine

## 2024-05-18 NOTE — Lactation Note (Signed)
 This note was copied from a baby's chart.  NICU Lactation Consultation Note  Patient Name: Chloe Perez Unijb'd Date: 05/18/2024 Age:46 wk.o.  Reason for consult: Weekly NICU follow-up; NICU baby; Late-preterm 34-36.6wks; Other (Comment); Maternal endocrine disorder; RN request; Breastfeeding assistance (AMA, cHTN, LGA) Type of Endocrine Disorder?: Diabetes; PCOS (T2DM (insulin , metformin ))  SUBJECTIVE Visited with family of 46 15/22 weeks old AGA NICU female Jorgito; Ms. Lyter is a P2 and reported she's pumping consistently, she has also been putting baby to breast but having difficulty latching and staying awake. NICU RN Leotis called out for latch assistance and nipple shield fitting, this LC took Jorgito to the R side in cross cradle hold but he wouldn't latch, he would suck on gloved finger but not at the breast, kept popping off the breast with and without the NS # 20, he kept looking for nipple even though it was already in his mouth. Baby very uncoordinated and still showing immature feeding skills. Ms. Sandner having a hard time priming the NS # 20 so she switched breasts. This time baby latch briefly and suckled the EBM on NS but after milk was gone he started acting the same way he did in the other breast and fell asleep shortly after. Asked NICU RN Leotis to do a full gavage feeding per IDF protocol. Reviewed strategies to increase supply, IDF 2 and how to do the stop watch to time the feedings.   OBJECTIVE Infant data: Mother's Current Feeding Choice: Breast Milk  O2 Device: Room Air  Infant feeding assessment IDFTS - Readiness: 2 IDFTS - Quality: 3   Maternal data: [id number] C-Section, Low Transverse Pumping frequency: 7-8 times/24 hours Pumped volume: 60 mL (60-120 ml)  WIC Program: Yes WIC Referral Sent?: Yes What county?: [place] Pump: WIC Pump  ASSESSMENT Infant: Latch: Repeated attempts needed to sustain latch, nipple held in  mouth throughout feeding, stimulation needed to elicit sucking reflex. Audible Swallowing: A few with stimulation (EBM already sitting on NS # 20) Type of Nipple: Everted at rest and after stimulation Comfort (Breast/Nipple): Soft / non-tender Hold (Positioning): Assistance needed to correctly position infant at breast and maintain latch. LATCH Score: 7  Feeding Status: Scheduled 8-11-2-5; IDF-2 Feeding method: Breast Nipple Type: Dr. Jonna Fling Preemie  Maternal: Milk volume: Normal (borderline low)  INTERVENTIONS/PLAN Interventions: Interventions: Breast feeding basics reviewed; Assisted with latch; Breast massage; Hand express; Breast compression; Coconut oil; DEBP; Education; Infant Driven Feeding Algorithm education Tools: Nipple Shields Nipple shield size: 20  Plan: Pump both breasts on maintain mode every 3 hours for 20-30 minutes; ideally 8 pumping sessions/24 hours Power pump once/day Continue taking Sula to breast on feeding cues around feeding times using NS # 20 PRN Family will continue advancing on bottle feedings   No other support person at this time. All questions and concerns answered, family to contact [facility] services PRN.  Consult Status: NICU follow-up NICU Follow-up type: Weekly NICU follow up; Assist with IDF-2 (Mother does not need to pre-pump before breastfeeding)   Mainor Hellmann S Ahaana Rochette 05/18/2024, 2:01 PM

## 2024-05-22 ENCOUNTER — Encounter: Payer: Self-pay | Admitting: Obstetrics & Gynecology

## 2024-05-22 NOTE — Lactation Note (Signed)
 This note was copied from a baby's chart.  NICU Lactation Consultation Note  Patient Name: Chloe Perez Unijb'd Date: 05/22/2024 Age:46 wk.o.  Reason for consult: Follow-up assessment; Breastfeeding assistance; NICU baby; Early term 55-38.6wks; Maternal endocrine disorder Type of Endocrine Disorder?: Diabetes; PCOS (GDM on Metformin  and insulin , AMA, GHTN)  SUBJECTIVE  SLP requested LC assistance with breastfeeding.  Sula is taking 40% PO currently.  Mom is trying to latch every time she is here with baby.    Baby cueing early for scheduled feeding.    Baby placed STS in cradle hold.  Mom trying very hard to push her nipple into baby's mouth.  Baby fussy.  Mom assisted in using the cross cradle hold, shaping her breast in a U hold to help baby latch deeply.  Baby unable to sustain a deep latch, fussy and very disorganized with his sucking.  LC initiated the 20 mm NS, but switched to 16 mm nipple shield for a better fit.  LC instilled EBM into shield and baby would suck for 2 sucks and become fussy.  Mom using cross cradle hold, but asked to use the cradle where she was more comfortable.  LC educated Mom not to push nipple into baby's mouth, but to wait for a large open mouth (boca grande) before bringing baby onto the breast.  Reinforced how this was baby-led and it wasn't advisable to push her breast in baby's mouth.  Mom nodding and asking more questions and seemed to understand how not to cause baby to be fussy.  LC took the nipple shield off and Mom used cradle hold and baby did open and latch and sustain the latch deep on the areola.  Baby sucked with nutritive suck pattern for 4 mins on right breast.  Swallows heard.  Baby came off on his own, burped and latched for another minute on the right breast.  Baby sucked with nutritive suck pattern for another minute before fatiguing.  Showed Mom how important it was to follow baby's cues and behavior.  Baby placed STS on Mom's chest  and RN to gavage 2/3 of feeding.  Mom encouraged to pump after baby breastfeeds, but Mom had to go as her 46 yr old was waiting on her.    Appt made for another feeding assist on 6/28 at 12 noon.    OBJECTIVE Infant data: No data recorded O2 Device: Room Air  Infant feeding assessment IDFTS - Readiness: 2 IDFTS - Quality: (S) 4 (SLP called to evaluate at future feeding time. Infant furrowing brow, paci every 3-4 sucks infant desaturated into low 80's briefly without bradycardia. chomping/tongue thrusting nipple out of mouth. PO feed stopped.)   Maternal data: [id number] C-Section, Low Transverse Pumping frequency: 8 times per 24 hrs Pumped volume: 90 mL (90-120) Flange Size: 18  WIC Program: Yes WIC Referral Sent?: Yes What county?: [place] Pump: WIC Pump  ASSESSMENT Infant: Latch: Repeated attempts needed to sustain latch, nipple held in mouth throughout feeding, stimulation needed to elicit sucking reflex. Audible Swallowing: Spontaneous and intermittent (for 4 mins) Type of Nipple: Everted at rest and after stimulation Comfort (Breast/Nipple): Soft / non-tender Hold (Positioning): Assistance needed to correctly position infant at breast and maintain latch. LATCH Score: 8  Feeding Status: Scheduled 9-12-3-6 Feeding method: Breast Nipple Type: Dr. Jonna Fling Preemie  Maternal: Milk volume: Normal  INTERVENTIONS/PLAN Interventions: Interventions: Breast feeding basics reviewed; Assisted with latch; Skin to skin; Breast massage; Hand express; Breast compression; Adjust position; Support pillows; Position options;  DEBP; Education Tools: Pump; Flanges; Bottle Pump Education: Setup, frequency, and cleaning; Milk Storage Nipple shield size: 16  Plan: Consult Status: NICU follow-up NICU Follow-up type: Assist with IDF-2 (Mother does not need to pre-pump before breastfeeding)   Claudene Aleck BRAVO 05/22/2024, 12:23 PM

## 2024-05-23 ENCOUNTER — Other Ambulatory Visit: Payer: Self-pay

## 2024-05-23 ENCOUNTER — Other Ambulatory Visit (HOSPITAL_COMMUNITY): Payer: Self-pay

## 2024-05-24 NOTE — Lactation Note (Signed)
 This note was copied from a baby's chart.  NICU Lactation Consultation Note  Patient Name: Chloe Perez Date: 05/24/2024 Age:46 wk.o.  Reason for consult: Follow-up assessment; NICU baby; Early term 35-38.6wks; Breastfeeding assistance; Maternal endocrine disorder Type of Endocrine Disorder?: PCOS; Diabetes (AMA)  SUBJECTIVE  LC arrived for 12 noon feeding.  Baby was placed back on HFNC due to desats with PO feeding yesterday.  LC met with Mom but she agreed that baby was sleepy and not showing any feeding interest.  LC assisted placing baby STS on Mom's chest where he continued to sleep.  Mom to continue her consistent pumping and LC would assist at another time.  OBJECTIVE Infant data: No data recorded O2 Device: Nasal Cannula O2 Flow Rate (L/min): 0.1 L/min FiO2 (%): 100 %  Infant feeding assessment IDFTS - Readiness: 3 IDFTS - Quality: 5 (desat event x2 during feeding)   Maternal data: [id number] C-Section, Low Transverse No data recorded WIC Program: Yes WIC Referral Sent?: Yes What county?: [place] Pump: WIC Pump  ASSESSMENT Infant:  Feeding Status: Scheduled 9-12-3-6 Feeding method: Tube/Gavage (Bolus) Nipple Type: Dr. Jonna Fling Preemie  Maternal: No data recorded INTERVENTIONS/PLAN Interventions: Interventions: Skin to skin; Breast massage; Hand express; DEBP; Education  Plan: Consult Status: NICU follow-up NICU Follow-up type: Weekly NICU follow up   Chloe Perez 05/24/2024, 1:16 PM

## 2024-05-26 ENCOUNTER — Other Ambulatory Visit: Payer: Self-pay

## 2024-05-26 ENCOUNTER — Other Ambulatory Visit (HOSPITAL_COMMUNITY): Payer: Self-pay

## 2024-05-27 ENCOUNTER — Other Ambulatory Visit: Payer: Self-pay

## 2024-05-28 ENCOUNTER — Encounter: Payer: Self-pay | Admitting: Obstetrics and Gynecology

## 2024-06-04 ENCOUNTER — Encounter: Payer: Self-pay | Admitting: Obstetrics and Gynecology

## 2024-06-08 NOTE — Lactation Note (Signed)
 This note was copied from a baby's chart.  NICU Lactation Consultation Note  Patient Name: Chloe Perez Unijb'd Date: 06/08/2024 Age:46 wk.o.  Reason for consult: Breastfeeding assistance; RN request; NICU baby; Term Type of Endocrine Disorder?: PCOS  SUBJECTIVE  RN called asking for LC to consult with P2 Mom of baby Sula in the NICU.  Baby is 40 weeks tomorrow and having difficulty with PO feedings.  Mom has been pumping consistently and recently noticed her milk volume dropping.  LC sized Mom with 21 mm flanges and provided her with a pumping band.  Mom also wanted to try baby at the breast.  LC offered assistance with cross cradle hold, making sure baby is able to latch deeply.  Baby repeatedly popping on and off the breast, rooting and unable to establish a seal on the breast.  LC initiated a 20 mm nipple shield and baby appeared to stay on for a few sucks longer, but Mom discarded it.  Baby fussy, fussy and latching and sucking for 10 seconds before stopping or popping off.  Mom had been assisted to use cross cradle hold, but switched to cradle.  Baby not opening his mouth and milk dripping from the nipple.  Again, he was rooting vigorously but not able to sustain a latch more than 30 seconds, no sign of milk transfer.  Baby placed STS on Mom's chest where he looked exhausted. RN had to re-inset the NG tube as baby pulled it out.  LC encouraged Mom to pump after each breastfeeding or attempt.  OBJECTIVE Infant data: No data recorded O2 Device: Room Air  Infant feeding assessment IDFTS - Readiness: 1 IDFTS - Quality: 3   Maternal data: [id number] C-Section, Low Transverse Pumping frequency: 8 times per 24 hrs Pumped volume: 60 mL (60-75 ml) Flange Size: 21 Hands-free pumping top sizes: Small/Medium (Blue)  WIC Program: Yes WIC Referral Sent?: Yes What county?: [place] Pump: WIC Pump  ASSESSMENT Infant: Latch: Repeated attempts needed to sustain latch,  nipple held in mouth throughout feeding, stimulation needed to elicit sucking reflex. Audible Swallowing: None Type of Nipple: Everted at rest and after stimulation Comfort (Breast/Nipple): Soft / non-tender Hold (Positioning): Assistance needed to correctly position infant at breast and maintain latch. LATCH Score: 6  Feeding Status: Scheduled 9-12-3-6 Feeding method: Breast Nipple Type: Dr. Jonna Preemie  Maternal: Milk volume: Normal  INTERVENTIONS/PLAN Interventions: Interventions: Breast feeding basics reviewed; Assisted with latch; Skin to skin; Breast massage; Hand express; Breast compression; Adjust position; Support pillows; Position options; Expressed milk; DEBP; Education Tools: Nipple Delene; Flanges; Pump; Hands-free pumping top Pump Education: Setup, frequency, and cleaning; Milk Storage Nipple shield size: 20  Plan: Consult Status: NICU follow-up NICU Follow-up type: Assist with IDF-2 (Mother does not need to pre-pump before breastfeeding)   Claudene Aleck BRAVO 06/08/2024, 3:41 PM

## 2024-06-11 ENCOUNTER — Encounter: Payer: Self-pay | Admitting: Obstetrics and Gynecology

## 2024-06-11 ENCOUNTER — Telehealth: Payer: Self-pay | Admitting: Internal Medicine

## 2024-06-11 NOTE — Telephone Encounter (Signed)
 Patient's baby has been scheduled for 06/12/24 at 8:30 AM.

## 2024-06-11 NOTE — Telephone Encounter (Signed)
 Patient called today and stated that she would like to establish her new born baby here at [facility] . Baby was born June 8 but has been in the NICU , baby will be discharge from hospital today and mother was asked to call baby's pediatric to set up appointment for tomorrow.

## 2024-06-12 ENCOUNTER — Telehealth: Payer: Self-pay | Admitting: *Deleted

## 2024-06-12 DIAGNOSIS — Z114 Encounter for screening for human immunodeficiency virus [HIV]: Secondary | ICD-10-CM

## 2024-06-12 NOTE — Telephone Encounter (Signed)
-----   Message from Chloe Perez sent at 06/11/2024  1:08 PM EDT ----- Regarding: Needs HIV drawn Please contact patient and have her come at [facility] for lab draw only to get HIV. None on file and peds in the hospital is requesting it.   Thanks [employer]

## 2024-06-12 NOTE — Telephone Encounter (Signed)
 Called pt w/interpreter [employer]. She was informed of need for blood draw to test for HIV.  This can be done @ her scheduled visit on 7/22. Pt stated that she has already been informed of need for this test per Peds @ the hospital and that she has a scheduled lab appointment for this on 8/15. Pt was advised that we will get the test done while she is in our office on 7/22 and then she will not need the lab appt on 8/15. She voiced understanding.

## 2024-06-17 ENCOUNTER — Encounter: Payer: Self-pay | Admitting: Obstetrics and Gynecology

## 2024-06-17 ENCOUNTER — Other Ambulatory Visit: Payer: Self-pay

## 2024-06-17 ENCOUNTER — Other Ambulatory Visit (HOSPITAL_COMMUNITY)
Admission: RE | Admit: 2024-06-17 | Discharge: 2024-06-17 | Disposition: A | Discharge status: Home / Self Care | Source: Ambulatory Visit | Attending: Obstetrics and Gynecology | Admitting: Obstetrics and Gynecology

## 2024-06-17 ENCOUNTER — Ambulatory Visit (INDEPENDENT_AMBULATORY_CARE_PROVIDER_SITE_OTHER): Payer: Self-pay | Admitting: Obstetrics and Gynecology

## 2024-06-17 DIAGNOSIS — N898 Other specified noninflammatory disorders of vagina: Secondary | ICD-10-CM | POA: Diagnosis not present

## 2024-06-17 NOTE — Progress Notes (Signed)
 Post Partum Visit Note  Chloe Perez is a 48 y.o. [id number] female who presents for a postpartum visit. She is 6 weeks postpartum following a low transverse c-section.  I have fully reviewed the prenatal and intrapartum course. The delivery was at 34/6 gestational weeks.  Anesthesia: spinal. Postpartum course has been uncomplicated. Baby is doing well. Baby is feeding by breast. Bleeding staining only, brown, and red. Bowel function is normal. Bladder function is abnormal: vaginal itching after voiding. Patient is not sexually active. Contraception method is undecided but leaning towards Nexplanon. Postpartum depression screening: negative.   The pregnancy intention screening data noted above was reviewed. Potential methods of contraception were discussed. The patient elected to proceed with nexplanon.   Edinburgh Postnatal Depression Scale - 06/17/24 1443       Edinburgh Postnatal Depression Scale:  In the Past 7 Days   I have been able to laugh and see the funny side of things. 0    I have looked forward with enjoyment to things. 0    I have blamed myself unnecessarily when things went wrong. 0    I have been anxious or worried for no good reason. 0    I have felt scared or panicky for no good reason. 0    Things have been getting on top of me. 0    I have been so unhappy that I have had difficulty sleeping. 0    I have felt sad or miserable. 0    I have been so unhappy that I have been crying. 0    The thought of harming myself has occurred to me. 0    Edinburgh Postnatal Depression Scale Total 0          Health Maintenance Due  Topic Date Due   FOOT EXAM  Never done   OPHTHALMOLOGY EXAM  Never done   Hepatitis B Vaccines (1 of 3 - 19+ 3-dose series) Never done   Cervical Cancer Screening (HPV/Pap Cotest)  05/01/2015   Colonoscopy  Never done   COVID-19 Vaccine (1 - 2024-25 season) Never done    The following portions of the patient's history were  reviewed and updated as appropriate: allergies, current medications, past family history, past medical history, past social history, past surgical history, and problem list.  Review of Systems Pertinent items are noted in HPI.  Objective:  BP (!) 110/58   Pulse (!) 106   LMP 08/13/2023 (Approximate)   Breastfeeding Yes    General:  alert, cooperative, and no distress   Breasts:  not indicated  Lungs: clear to auscultation bilaterally  Heart:  regular rate and rhythm  Abdomen: soft, non-tender; bowel sounds normal; no masses,  no organomegaly   Wound well approximated incision, small pin hole area on left lateral aspect, small amount of clear drainage  GU exam:  not indicated       Assessment:   Encounter for postpartum care  normal postpartum exam.   Plan:   Essential components of care per ACOG recommendations:  1.  Mood and well being: Patient with negative depression screening today. Reviewed local resources for support.  - Patient tobacco use? No.   - hx of drug use? No.    2. Infant care and feeding:  -Patient currently breastmilk feeding? Yes. Reviewed importance of draining breast regularly to support lactation.  -Social determinants of health (SDOH) reviewed in EPIC. No concerns.  3. Sexuality, contraception and birth spacing - Patient does not  want a pregnancy in the next year.  Desired family size is 2 children.  - Reviewed reproductive life planning. Reviewed contraceptive methods based on pt preferences and effectiveness.  Patient desired Hormonal Implant today.   - Discussed birth spacing of 18 months  4. Sleep and fatigue -Encouraged family/partner/community support of 4 hrs of uninterrupted sleep to help with mood and fatigue  5. Physical Recovery  - Discussed patients delivery and complications. She describes her labor as mixed. - Patient had a c section for malpresentation. Perineal healing reviewed. Patient expressed understanding - Patient has  urinary incontinence? No. - Patient is safe to resume physical and sexual activity  6.  Health Maintenance - HM due items addressed Yes - Last pap smear No results found for: DIAGPAP Pap smear not done at today's visit. Due to insurance issues -Breast Cancer screening indicated? Yes. Patient referred today for mammogram.   She will go to mammogram clinic in [facility] per her request  7. Chronic Disease/Pregnancy Condition follow up: Hypertension and type 2 diabetes  - PCP follow up regarding preexisting diabetes  Once insurance issues are sorted out, nexplanon can be placed, otherwise can be placed at health department  Jerilynn DELENA Buddle, MD Center for [facility], [facility]

## 2024-06-18 LAB — CERVICOVAGINAL ANCILLARY ONLY
Bacterial Vaginitis (gardnerella): NEGATIVE
Candida Glabrata: NEGATIVE
Candida Vaginitis: POSITIVE — AB
Chlamydia: NEGATIVE
Comment: NEGATIVE
Comment: NEGATIVE
Comment: NEGATIVE
Comment: NEGATIVE
Comment: NORMAL
Neisseria Gonorrhea: NEGATIVE

## 2024-06-18 LAB — HIV ANTIBODY (ROUTINE TESTING W REFLEX): HIV Screen 4th Generation wRfx: NONREACTIVE

## 2024-06-19 ENCOUNTER — Ambulatory Visit: Payer: Self-pay | Admitting: Obstetrics and Gynecology

## 2024-06-19 ENCOUNTER — Other Ambulatory Visit: Payer: Self-pay

## 2024-06-19 DIAGNOSIS — B3731 Acute candidiasis of vulva and vagina: Secondary | ICD-10-CM

## 2024-06-19 MED ORDER — FLUCONAZOLE 150 MG PO TABS
150.0000 mg | ORAL_TABLET | Freq: Once | ORAL | 0 refills | Status: AC
Start: 2024-06-19 — End: 2024-06-21
  Filled 2024-06-19: qty 1, 1d supply, fill #0

## 2024-06-19 NOTE — Telephone Encounter (Signed)
 I called patient with Interpreter Eda Royal and reviewed results and offered treatment. She desires treatment, RX sent to her preferred pharmacy for Diflucan  x1. Rock Skip PEAK

## 2024-06-19 NOTE — Telephone Encounter (Signed)
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

## 2024-06-30 ENCOUNTER — Other Ambulatory Visit: Payer: Self-pay

## 2024-06-30 ENCOUNTER — Other Ambulatory Visit: Payer: Self-pay | Admitting: Obstetrics and Gynecology

## 2024-06-30 MED ORDER — NIFEDIPINE ER OSMOTIC RELEASE 30 MG PO TB24
30.0000 mg | ORAL_TABLET | Freq: Every day | ORAL | 3 refills | Status: DC
  Filled 2024-06-30: qty 30, 30d supply, fill #0

## 2024-07-02 ENCOUNTER — Other Ambulatory Visit: Payer: Self-pay

## 2024-07-07 ENCOUNTER — Other Ambulatory Visit (HOSPITAL_COMMUNITY): Payer: Self-pay

## 2024-07-11 ENCOUNTER — Other Ambulatory Visit: Payer: Self-pay

## 2024-08-04 ENCOUNTER — Telehealth: Payer: Self-pay | Admitting: Internal Medicine

## 2024-08-04 NOTE — Telephone Encounter (Signed)
 Please let pt. Know she has refills and should contact the pharmacy.

## 2024-08-04 NOTE — Telephone Encounter (Signed)
 Patient is requesting medication refills for medications    metFORMIN  (GLUCOPHAGE ) 1000 MG tablet [[id number]]   NIFEdipine  (PROCARDIA -XL/NIFEDICAL-XL) 30 MG 24 hr tablet [[id number]]   prenatal vitamin w/FE, FA (PRENATAL 1 + 1) 27-1 MG TABS tablet [[id number]    Patient would like for medications to be sent to:  [facility] [address], [place] [address] Phone: [phone]  Fax: [phone]

## 2024-08-04 NOTE — Telephone Encounter (Signed)
 Patient has been notified

## 2024-08-06 ENCOUNTER — Ambulatory Visit: Payer: Self-pay | Admitting: Internal Medicine

## 2024-08-06 ENCOUNTER — Encounter: Payer: Self-pay | Admitting: Internal Medicine

## 2024-08-06 ENCOUNTER — Other Ambulatory Visit (HOSPITAL_COMMUNITY): Payer: Self-pay

## 2024-08-06 VITALS — BP 138/70 | HR 74 | Resp 18 | Ht 60.0 in | Wt 139.0 lb

## 2024-08-06 DIAGNOSIS — E1169 Type 2 diabetes mellitus with other specified complication: Secondary | ICD-10-CM

## 2024-08-06 DIAGNOSIS — H547 Unspecified visual loss: Secondary | ICD-10-CM

## 2024-08-06 DIAGNOSIS — I10 Essential (primary) hypertension: Secondary | ICD-10-CM | POA: Insufficient documentation

## 2024-08-06 DIAGNOSIS — E785 Hyperlipidemia, unspecified: Secondary | ICD-10-CM

## 2024-08-06 DIAGNOSIS — E119 Type 2 diabetes mellitus without complications: Secondary | ICD-10-CM

## 2024-08-06 DIAGNOSIS — D649 Anemia, unspecified: Secondary | ICD-10-CM

## 2024-08-06 DIAGNOSIS — O24112 Pre-existing diabetes mellitus, type 2, in pregnancy, second trimester: Secondary | ICD-10-CM

## 2024-08-06 MED ORDER — METFORMIN HCL 1000 MG PO TABS
1000.0000 mg | ORAL_TABLET | Freq: Two times a day (BID) | ORAL | 3 refills | Status: AC
  Filled 2024-08-06: qty 60, 30d supply, fill #0
  Filled 2024-08-07: qty 180, 90d supply, fill #0
  Filled 2024-12-02: qty 180, 90d supply, fill #1

## 2024-08-06 MED ORDER — BLOOD GLUCOSE TEST VI STRP: ORAL_STRIP | 11 refills | Status: AC

## 2024-08-06 MED ORDER — LANCETS MISC. MISC: 0 refills | Status: AC

## 2024-08-06 MED ORDER — NORETHINDRONE 0.35 MG PO TABS
1.0000 | ORAL_TABLET | Freq: Every day | ORAL | 3 refills | Status: AC
  Filled 2024-08-06 – 2024-08-07 (×2): qty 28, 28d supply, fill #0
  Filled 2024-10-13: qty 28, 28d supply, fill #1

## 2024-08-06 MED ORDER — LANCET DEVICE MISC: 0 refills | Status: AC

## 2024-08-06 MED ORDER — PRENATAL PLUS 27-1 MG PO TABS
1.0000 | ORAL_TABLET | Freq: Every day | ORAL | 3 refills | Status: AC
  Filled 2024-09-02: qty 30, 30d supply, fill #0
  Filled 2024-10-13: qty 30, 30d supply, fill #1

## 2024-08-06 MED ORDER — NIFEDIPINE ER OSMOTIC RELEASE 30 MG PO TB24
30.0000 mg | ORAL_TABLET | Freq: Every day | ORAL | 11 refills | Status: DC
  Filled 2024-08-06: qty 30, 30d supply, fill #0
  Filled 2024-08-07: qty 90, 90d supply, fill #0

## 2024-08-06 MED ORDER — BLOOD GLUCOSE MONITORING SUPPL DEVI: 0 refills | Status: AC

## 2024-08-06 NOTE — Progress Notes (Signed)
 Subjective:    Patient ID: Chloe Perez [name], female   DOB: 02/21/1976, 48 y.o.   MRN: [id number]   HPI  Erminio Bloomer interprets   DM:  is 3 months post partum and has lost about 40 lbs.  She does not check her sugar often, but checked her sugar about 15 minutes after eating and measure 232.  She is only using Metformin .  Has been off insulin  since about 1 month after delivery.  Previously on glimepiride , but not adequately controlled.    2.  Hypertension:  Procardia  is compatible with breast feeding and she is tolerating it well.    3.  History of hyperlipidemia:  has not been checked since weight loss.   4.  HM:  has never obtained influenza vaccine.  Has infant under 6 months at home.  Similar history with COVID.   Has had a mammogram in [place] 12/2022  Normal, but dense.   Currently, nursing.    Current Meds  Medication Sig   acetaminophen  (TYLENOL ) 325 MG tablet Take 2 tablets (650 mg total) by mouth every 4 (four) hours as needed.   Alcohol  Swabs (ALCOHOL  PADS) 70 % PADS 1 Pad by Does not apply route 4 (four) times daily.   Blood Pressure Monitoring (OMRON 3 SERIES BP MONITOR) DEVI Use to take blood pressure as needed   metFORMIN  (GLUCOPHAGE ) 1000 MG tablet Take 1 tablet (1,000 mg total) by mouth 2 (two) times daily with a meal.   NIFEdipine  (ADALAT  CC) 30 MG 24 hr tablet Take 1 tablet (30 mg total) by mouth daily.   NIFEdipine  (PROCARDIA -XL/NIFEDICAL-XL) 30 MG 24 hr tablet Take 1 tablet (30 mg total) by mouth daily.     No Known Allergies   Review of Systems  Neurological:  Negative for weakness and numbness.      Objective:   BP 138/70 (BP Location: Left Arm, Patient Position: Sitting, Cuff Size: Normal)   Pulse 74   Resp 18   Ht 5' (1.524 m)   Wt 139 lb (63 kg)   LMP 07/30/2024 (Approximate)   Breastfeeding Yes   BMI 27.15 kg/m   Physical Exam HENT:     Head: Normocephalic and atraumatic.  Eyes:     Extraocular Movements:  Extraocular movements intact.     Pupils: Pupils are equal, round, and reactive to light.  Cardiovascular:     Rate and Rhythm: Normal rate and regular rhythm.     Pulses: Normal pulses.     Heart sounds: S1 normal and S2 normal. Murmur heard.     Systolic murmur is present with a grade of 2/6.     No friction rub. No S3 or S4 sounds.     Comments: Murmur LSB Pulmonary:     Effort: Pulmonary effort is normal.     Breath sounds: Normal breath sounds.  Abdominal:     General: Bowel sounds are normal.     Palpations: Abdomen is soft.  Musculoskeletal:     Cervical back: Normal range of motion and neck supple.     Right lower leg: No edema.     Left lower leg: No edema.  Neurological:     Mental Status: She is alert.      Assessment & Plan   DM:  Refilled Metformin .  Patient currently weighs close to 20 lbs less than when established in November before pregnancy.  Check A1C and urine microalbumin/crea before starting other meds.  Hold on addition of  Ozempic and particularly Jardiance at this point as she is breast feeding.    2.  Hypertension:  controlled on Nifedipine .  No change for now.  3.  Hyperlipidemia:  lipid panel.  Would not start statin while nursing  4.  Anemia after delivery:  CBC  5.  Family planning:  with recent unexpected pregnancy, she would like BCPs.  Norethindrone  0.35 mg daily.  6.   HM:  Declined influenza and considering COVID.  Had mammogram within the last 1.5 years--hold on reorder.

## 2024-08-07 ENCOUNTER — Ambulatory Visit: Payer: Self-pay | Admitting: Obstetrics & Gynecology

## 2024-08-07 ENCOUNTER — Other Ambulatory Visit (HOSPITAL_COMMUNITY): Payer: Self-pay

## 2024-08-07 ENCOUNTER — Other Ambulatory Visit: Payer: Self-pay

## 2024-08-07 LAB — SPECIMEN STATUS REPORT

## 2024-08-12 LAB — LIPID PANEL W/O CHOL/HDL RATIO

## 2024-08-12 LAB — COMPREHENSIVE METABOLIC PANEL WITH GFR

## 2024-08-14 ENCOUNTER — Other Ambulatory Visit: Payer: Self-pay | Admitting: Pharmacist

## 2024-08-14 ENCOUNTER — Other Ambulatory Visit: Payer: Self-pay

## 2024-08-14 DIAGNOSIS — I10 Essential (primary) hypertension: Secondary | ICD-10-CM

## 2024-08-14 DIAGNOSIS — O24112 Pre-existing diabetes mellitus, type 2, in pregnancy, second trimester: Secondary | ICD-10-CM

## 2024-08-21 ENCOUNTER — Other Ambulatory Visit: Payer: Self-pay

## 2024-08-22 LAB — COMPREHENSIVE METABOLIC PANEL WITH GFR

## 2024-08-22 LAB — CBC WITH DIFFERENTIAL/PLATELET
Basophils Absolute: 0.1 x10E3/uL (ref 0.0–0.2)
Basos: 1 %
EOS (ABSOLUTE): 0.2 x10E3/uL (ref 0.0–0.4)
Eos: 3 %
Hematocrit: 45.7 % (ref 34.0–46.6)
Hemoglobin: 15.1 g/dL (ref 11.1–15.9)
Immature Grans (Abs): 0 x10E3/uL (ref 0.0–0.1)
Immature Granulocytes: 0 %
Lymphocytes Absolute: 2.9 x10E3/uL (ref 0.7–3.1)
Lymphs: 37 %
MCH: 29.3 pg (ref 26.6–33.0)
MCHC: 33 g/dL (ref 31.5–35.7)
MCV: 89 fL (ref 79–97)
Monocytes Absolute: 0.5 x10E3/uL (ref 0.1–0.9)
Monocytes: 7 %
Neutrophils Absolute: 4.2 x10E3/uL (ref 1.4–7.0)
Neutrophils: 52 %
Platelets: 274 x10E3/uL (ref 150–450)
RBC: 5.16 x10E6/uL (ref 3.77–5.28)
RDW: 12.8 % (ref 11.7–15.4)
WBC: 7.9 x10E3/uL (ref 3.4–10.8)

## 2024-08-22 LAB — LIPID PANEL W/O CHOL/HDL RATIO

## 2024-08-22 LAB — HGB A1C W/O EAG: Hgb A1c MFr Bld: 10.5 % — ABNORMAL HIGH (ref 4.8–5.6)

## 2024-08-22 LAB — MICROALBUMIN / CREATININE URINE RATIO
Creatinine, Urine: 38.4 mg/dL
Microalb/Creat Ratio: 210 mg/g{creat} — ABNORMAL HIGH (ref 0–29)
Microalbumin, Urine: 80.8 ug/mL

## 2024-09-02 ENCOUNTER — Other Ambulatory Visit: Payer: Self-pay

## 2024-09-02 ENCOUNTER — Ambulatory Visit: Payer: Self-pay | Admitting: Internal Medicine

## 2024-09-02 MED ORDER — GLIPIZIDE 5 MG PO TABS
5.0000 mg | ORAL_TABLET | Freq: Two times a day (BID) | ORAL | 11 refills | Status: DC
  Filled 2024-09-02: qty 60, 30d supply, fill #0
  Filled 2024-10-13: qty 60, 30d supply, fill #1
  Filled 2024-12-02: qty 60, 30d supply, fill #2

## 2024-09-04 ENCOUNTER — Other Ambulatory Visit: Payer: Self-pay

## 2024-09-04 ENCOUNTER — Encounter: Payer: Self-pay | Admitting: Internal Medicine

## 2024-09-04 ENCOUNTER — Ambulatory Visit: Payer: Self-pay | Admitting: Internal Medicine

## 2024-09-04 VITALS — BP 120/72 | HR 88 | Resp 18 | Ht 60.0 in | Wt 137.0 lb

## 2024-09-04 DIAGNOSIS — R1030 Lower abdominal pain, unspecified: Secondary | ICD-10-CM

## 2024-09-04 DIAGNOSIS — I10 Essential (primary) hypertension: Secondary | ICD-10-CM

## 2024-09-04 DIAGNOSIS — E119 Type 2 diabetes mellitus without complications: Secondary | ICD-10-CM

## 2024-09-04 LAB — POCT URINALYSIS DIPSTICK
Bilirubin, UA: NEGATIVE
Glucose, UA: POSITIVE — AB
Leukocytes, UA: NEGATIVE
Nitrite, UA: NEGATIVE
Protein, UA: POSITIVE — AB
Spec Grav, UA: 1.02 (ref 1.010–1.025)
Urobilinogen, UA: 1 U/dL
pH, UA: 6 (ref 5.0–8.0)

## 2024-09-04 NOTE — Progress Notes (Signed)
 "   Subjective:    Patient ID: Chloe Perez, female   DOB: Feb 01, 1976, 48 y.o.   MRN: [id number]   HPI   DM:  Was in 2 days ago with her 51 month old son's visit and shared her sugars were high.  She is not able to start Jardiance nor Ozempic as breastfeeding.  Sent Rx for glipizide  to pharmacy 2d ago, but she has yet to pick up.  Checking sugars before breakfast and 2 hours after meal.  Sugars running in high 200s fasting and 300s 2h postprandial.   8:30 am  Beans and 3 to 4 small corn tortillas.  Coffee with sugar Also was eating bread more.   Lunch:  Pork, chicken , beef, rice, beans 3-4 tortillas as well, soup.  Dinner:  similar to lunch    Current Meds  Medication Sig   acetaminophen  (TYLENOL ) 325 MG tablet Take 2 tablets (650 mg total) by mouth every 4 (four) hours as needed.   Alcohol  Swabs (ALCOHOL  PADS) 70 % PADS 1 Pad by Does not apply route 4 (four) times daily.   Blood Glucose Monitoring Suppl DEVI May substitute to any manufacturer covered by patient's insurance.  Check sugars twice daily before meals   Blood Pressure Monitoring (OMRON 3 SERIES BP MONITOR) DEVI Use to take blood pressure as needed   Glucose Blood (BLOOD GLUCOSE TEST STRIPS) STRP May substitute to any manufacturer covered by patient's insurance.  Check blood glucose twice daily before meals   Lancet Device MISC May substitute to any manufacturer covered by patient's insurance.  Check blood glucose twice daily before meals   Lancets Misc. MISC May substitute to any manufacturer covered by patient's insurance.  Check blood glucose twice daily before meals   metFORMIN  (GLUCOPHAGE ) 1000 MG tablet Take 1 tablet (1,000 mg total) by mouth 2 (two) times daily with a meal.   NIFEdipine  (PROCARDIA -XL/NIFEDICAL-XL) 30 MG 24 hr tablet Take 1 tablet (30 mg total) by mouth daily.   norethindrone  (MICRONOR ) 0.35 MG tablet Take 1 tablet (0.35 mg total) by mouth daily.   prenatal vitamin w/FE, FA (PRENATAL  1 + 1) 27-1 MG TABS tablet Take 1 tablet by mouth daily.   No Known Allergies   Review of Systems    Objective:   BP 120/72 (BP Location: Left Arm, Patient Position: Sitting, Cuff Size: Normal)   Pulse 88   Resp 18   Ht 5' (1.524 m)   Wt 137 lb (62.1 kg)   LMP 08/25/2024 (Exact Date)   BMI 26.76 kg/m   Physical Exam HENT:     Head: Normocephalic and atraumatic.     Mouth/Throat:     Mouth: Mucous membranes are moist.     Pharynx: Oropharynx is clear.  Eyes:     Extraocular Movements: Extraocular movements intact.     Conjunctiva/sclera: Conjunctivae normal.     Pupils: Pupils are equal, round, and reactive to light.  Cardiovascular:     Rate and Rhythm: Normal rate and regular rhythm.     Pulses: Normal pulses.     Heart sounds: Normal heart sounds. No murmur heard.    No friction rub.  Pulmonary:     Effort: Pulmonary effort is normal.     Breath sounds: Normal breath sounds.  Abdominal:     General: Bowel sounds are normal.     Palpations: Abdomen is soft. There is no hepatomegaly, splenomegaly or mass.     Comments: Low transverse scar well healed  and NT throughout.  Musculoskeletal:     Cervical back: Normal range of motion and neck supple.     Right lower leg: No edema.     Left lower leg: No edema.  Neurological:     Mental Status: She is alert.      Assessment & Plan  DM:  not controlled.  Not following diet with attention to limiting low fiber carbs.  She needs to pick up glipizide  and get started.  Went over [employer] and making goals with daily physical activity.    2.  HM:  Declines influenza and covid vaccine.  Strongly urged her to reconsider to protect herself and her newborn, who is currently too young for vaccination with either vaccine. .   3.  Hypertension:  controlled with Nifedipine . "

## 2024-09-04 NOTE — Patient Instructions (Signed)
 Tome un vaso de agua antes de cada comida Tome un minimo de 6 a 8 vasos de agua diarios Coma tres veces al dia Coma una proteina y Neomia Dear grasa saludable con comida.  (huevos, pescado, pollo, pavo, y limite carnes rojas Coma 5 porciones diarias de legumbres.  Mezcle los colores Coma 2 porciones diarias de frutas con cascara cuando sea comestible Use platos pequeos Suelte su tenedor o cuchara despues de cada mordida hata que se mastique y se trague Come en la mesa con amigos o familiares por lo menos una vez al dia [place] la televisin y aparatos electrnicos durante la comida  Su objetivo debe ser perder una libra por semana  Estudios recientes indican que las personas quienes consumen todos de sus calorias durante 12 horas se bajan de pesocon Mas eficiencia.  Por ejemplo, si Usted come su primera comida a las 7:00 a.m., su comida final del dia se debe completar antes de las 7:00 p.m.

## 2024-09-12 ENCOUNTER — Ambulatory Visit: Payer: Self-pay | Admitting: Internal Medicine

## 2024-10-13 ENCOUNTER — Other Ambulatory Visit: Payer: Self-pay

## 2024-12-02 ENCOUNTER — Other Ambulatory Visit: Payer: Self-pay

## 2024-12-05 ENCOUNTER — Other Ambulatory Visit: Payer: Self-pay

## 2024-12-05 DIAGNOSIS — E1169 Type 2 diabetes mellitus with other specified complication: Secondary | ICD-10-CM

## 2024-12-05 DIAGNOSIS — E119 Type 2 diabetes mellitus without complications: Secondary | ICD-10-CM

## 2024-12-06 LAB — LIPID PANEL W/O CHOL/HDL RATIO
Cholesterol, Total: 170 mg/dL (ref 100–199)
HDL: 34 mg/dL — ABNORMAL LOW
LDL Chol Calc (NIH): 101 mg/dL — ABNORMAL HIGH (ref 0–99)
Triglycerides: 203 mg/dL — ABNORMAL HIGH (ref 0–149)
VLDL Cholesterol Cal: 35 mg/dL (ref 5–40)

## 2024-12-06 LAB — HEMOGLOBIN A1C
Est. average glucose Bld gHb Est-mCnc: 280 mg/dL
Hgb A1c MFr Bld: 11.4 % — ABNORMAL HIGH (ref 4.8–5.6)

## 2024-12-11 ENCOUNTER — Encounter: Payer: Self-pay | Admitting: Internal Medicine

## 2024-12-11 ENCOUNTER — Other Ambulatory Visit: Payer: Self-pay

## 2024-12-11 ENCOUNTER — Ambulatory Visit: Payer: Self-pay | Admitting: Internal Medicine

## 2024-12-11 VITALS — BP 130/70 | HR 80 | Resp 16 | Ht 60.0 in | Wt 138.0 lb

## 2024-12-11 DIAGNOSIS — I10 Essential (primary) hypertension: Secondary | ICD-10-CM

## 2024-12-11 DIAGNOSIS — E119 Type 2 diabetes mellitus without complications: Secondary | ICD-10-CM

## 2024-12-11 DIAGNOSIS — K0889 Other specified disorders of teeth and supporting structures: Secondary | ICD-10-CM

## 2024-12-11 MED ORDER — GLIPIZIDE 10 MG PO TABS
10.0000 mg | ORAL_TABLET | Freq: Two times a day (BID) | ORAL | 3 refills | Status: AC
  Filled 2024-12-11: qty 180, 90d supply, fill #0

## 2024-12-11 NOTE — Patient Instructions (Signed)
 Tome un vaso de agua antes de cada comida Tome un minimo de 6 a 8 vasos de agua diarios Coma tres veces al dia Coma una proteina y Neomia Dear grasa saludable con comida.  (huevos, pescado, pollo, pavo, y limite carnes rojas Coma 5 porciones diarias de legumbres.  Mezcle los colores Coma 2 porciones diarias de frutas con cascara cuando sea comestible Use platos pequeos Suelte su tenedor o cuchara despues de cada mordida hata que se mastique y se trague Come en la mesa con amigos o familiares por lo menos una vez al dia [place] la televisin y aparatos electrnicos durante la comida  Su objetivo debe ser perder una libra por semana  Estudios recientes indican que las personas quienes consumen todos de sus calorias durante 12 horas se bajan de pesocon Mas eficiencia.  Por ejemplo, si Usted come su primera comida a las 7:00 a.m., su comida final del dia se debe completar antes de las 7:00 p.m.

## 2024-12-11 NOTE — Progress Notes (Signed)
 "   Subjective:    Patient ID: Chloe Perez [name], female   DOB: 12-Sep-1976, 49 y.o.   MRN: [id number]   HPI  Erminio Bloomer interprets    Hypertension:  has not been filling nifedipine .  She just states she did not return to fill.  Appears all fills from September prescription are left.    2.  DM:  A1C now up to 11.5%.  States she is taking glipizide  and Metformin  twice daily regularly, but again, appears she has not filled appropriately based on refills left.   Admits to not eating in healthy way--eats pan dulce almost every morning. Limited on switching to Jardiance and Ozempic as breastfeeding.    3.  Dyslipidemia:  LDL and trigs remain high and HDL low.  Breastfeeding. Lipid Panel     Component Value Date/Time   CHOL 170 12/05/2024 0934   TRIG 203 (H) 12/05/2024 0934   HDL 34 (L) 12/05/2024 0934   LDLCALC 101 (H) 12/05/2024 0934   LABVLDL 35 12/05/2024 0934        Current Meds  Medication Sig   glipiZIDE  (GLUCOTROL ) 5 MG tablet Take 1 tablet (5 mg total) by mouth 2 (two) times daily before a meal.   metFORMIN  (GLUCOPHAGE ) 1000 MG tablet Take 1 tablet (1,000 mg total) by mouth 2 (two) times daily with a meal.   norethindrone  (MICRONOR ) 0.35 MG tablet Take 1 tablet (0.35 mg total) by mouth daily.   prenatal vitamin w/FE, FA (PRENATAL 1 + 1) 27-1 MG TABS tablet Take 1 tablet by mouth daily.   No Known Allergies   Review of Systems    Objective:   BP 130/70 (BP Location: Left Arm, Patient Position: Sitting, Cuff Size: Normal)   Pulse 80   Resp 16   Ht 5' (1.524 m)   Wt 138 lb (62.6 kg)   LMP 11/25/2024 (Approximate)   BMI 26.95 kg/m   Physical Exam HENT:     Head: Normocephalic and atraumatic.     Mouth/Throat:     Mouth: Mucous membranes are moist.     Pharynx: Oropharynx is clear.  Eyes:     Extraocular Movements: Extraocular movements intact.     Conjunctiva/sclera: Conjunctivae normal.     Pupils: Pupils are equal, round, and reactive  to light.  Cardiovascular:     Rate and Rhythm: Normal rate and regular rhythm.     Pulses: Normal pulses.     Heart sounds: Normal heart sounds. No murmur heard.    No friction rub.  Pulmonary:     Effort: Pulmonary effort is normal.     Breath sounds: Normal breath sounds.  Musculoskeletal:     Cervical back: Normal range of motion and neck supple.     Right lower leg: No edema.     Left lower leg: No edema.  Neurological:     Mental Status: She is alert.      Assessment & Plan   DM:  Son now at 72 months of age.  Discussed considering weaning him from breast milk between 29 to 56 months of age so we can consider other medications to get her DM well controlled.   Increase glipizide  to 10 mg twice daily.  Encouraged pillbox for her to be able to ascertain whether actually taking meds.   Discussed after next recheck of A1C, if no improvement, will need to start insulin .   She is not, however, making changes with lifestyle and encouraged her to get started  making weekly goals with diet and physical activity. She is willing to undergo nutrition counseling with Dr. Fleta.  .  2.  Hypertension:  encouraged her to get back on Nifedipine .  Would like to see bp at 120/70 range.    3.  Right upper jaw pain:  referral to Dentistry.  She is now without Medicaid.  4.  HM:  Encouraged COVID vaccine again.  She declines. "

## 2024-12-26 ENCOUNTER — Encounter: Payer: Self-pay | Admitting: Internal Medicine

## 2024-12-26 ENCOUNTER — Ambulatory Visit: Payer: Self-pay | Admitting: Internal Medicine

## 2024-12-26 ENCOUNTER — Other Ambulatory Visit: Payer: Self-pay

## 2024-12-26 ENCOUNTER — Other Ambulatory Visit: Payer: Self-pay | Admitting: Internal Medicine

## 2024-12-26 MED ORDER — NIFEDIPINE ER OSMOTIC RELEASE 30 MG PO TB24
30.0000 mg | ORAL_TABLET | Freq: Every day | ORAL | 11 refills | Status: AC
  Filled 2024-12-26: qty 90, 90d supply, fill #0

## 2024-12-26 NOTE — Progress Notes (Unsigned)
" ° °  Subjective:   Chief Complaint  Patient presents with   Nutrition Counseling    DM out of control     Patient ID: Chloe Perez [name], female    DOB: 1975-12-22, 49 y.o.   MRN: [id number]  HPI -Breakfast: 6am-coffee, sandwich with 2 slices of bread , egg , ham and american cheese, no oil -Early lunch-10am 4 tostillas , 1 cup cooked rice, pork with chille non lean; sie of 2-3 credit cards -Late lunch-1pm same as lunch -Dinner 7pm depending on when husband gets in. : same as lunch 5 16 oz bottles of water  a day Npo:enmx oil or vegetable oil Meat: regular fat Bread- conchas for breakfast sometimes. Cookies- 6 cookies 3 times a week instead of the concha Sleep- 6 hours at night and then 2 hours during the day. Because of breast feeding or feeding the baby. Stress- not worried or stressed  Allergies[1] hover on dots Active Medications[2]  hover on dots       Objective:   NAD;  Weight 141 pounds BMI 28 High waist circumference     Assessment & Plan:  1- Nutritional Education 2- DM out of control 3- BMI in over-weight range  Counseled on Nutrition.  Changes made to  diet with pt agreement: See discharge instructions.  Exercises given; she is to do 30 minutes of biking a day and 20 exercises for the arms and legs. Get low fat meat.  F/U with Dr. Adella as scheduled.     [1] No Known Allergies [2]  No outpatient medications have been marked as taking for the 12/26/24 encounter (Office Visit) with Adella Norris, MD.   "

## 2025-02-20 ENCOUNTER — Other Ambulatory Visit: Payer: Self-pay

## 2025-02-23 ENCOUNTER — Encounter: Payer: Self-pay | Admitting: Internal Medicine

## 2025-03-12 ENCOUNTER — Other Ambulatory Visit: Payer: Self-pay

## 2025-03-19 ENCOUNTER — Ambulatory Visit: Payer: Self-pay | Admitting: Internal Medicine

## 2025-07-01 ENCOUNTER — Encounter: Payer: Self-pay | Admitting: Internal Medicine
